# Patient Record
Sex: Female | Born: 1965 | Race: White | Hispanic: No | Marital: Single | Smoking: Current every day smoker
Health system: Southern US, Community
[De-identification: ages and names within clinical notes are randomized; demographics above are authoritative.]

## PROBLEM LIST (undated history)

## (undated) DIAGNOSIS — F419 Anxiety disorder, unspecified: Secondary | ICD-10-CM

## (undated) DIAGNOSIS — I471 Supraventricular tachycardia: Secondary | ICD-10-CM

## (undated) DIAGNOSIS — J449 Chronic obstructive pulmonary disease, unspecified: Secondary | ICD-10-CM

## (undated) DIAGNOSIS — M940 Chondrocostal junction syndrome [Tietze]: Secondary | ICD-10-CM

## (undated) DIAGNOSIS — K5792 Diverticulitis of intestine, part unspecified, without perforation or abscess without bleeding: Secondary | ICD-10-CM

## (undated) DIAGNOSIS — Z720 Tobacco use: Secondary | ICD-10-CM

## (undated) DIAGNOSIS — K2270 Barrett's esophagus without dysplasia: Secondary | ICD-10-CM

## (undated) DIAGNOSIS — G8929 Other chronic pain: Secondary | ICD-10-CM

## (undated) DIAGNOSIS — I8390 Asymptomatic varicose veins of unspecified lower extremity: Secondary | ICD-10-CM

## (undated) DIAGNOSIS — R519 Headache, unspecified: Secondary | ICD-10-CM

## (undated) DIAGNOSIS — M5430 Sciatica, unspecified side: Secondary | ICD-10-CM

## (undated) DIAGNOSIS — C539 Malignant neoplasm of cervix uteri, unspecified: Secondary | ICD-10-CM

## (undated) DIAGNOSIS — K635 Polyp of colon: Secondary | ICD-10-CM

## (undated) DIAGNOSIS — R002 Palpitations: Secondary | ICD-10-CM

## (undated) DIAGNOSIS — K219 Gastro-esophageal reflux disease without esophagitis: Secondary | ICD-10-CM

## (undated) DIAGNOSIS — K5790 Diverticulosis of intestine, part unspecified, without perforation or abscess without bleeding: Secondary | ICD-10-CM

## (undated) DIAGNOSIS — R0600 Dyspnea, unspecified: Secondary | ICD-10-CM

## (undated) DIAGNOSIS — R51 Headache: Secondary | ICD-10-CM

## (undated) DIAGNOSIS — N83209 Unspecified ovarian cyst, unspecified side: Secondary | ICD-10-CM

## (undated) HISTORY — PX: LEEP: SHX91

## (undated) HISTORY — DX: Tobacco use: Z72.0

## (undated) HISTORY — PX: UMBILICAL HERNIA REPAIR: SHX196

## (undated) HISTORY — PX: UPPER GASTROINTESTINAL ENDOSCOPY: SHX188

## (undated) HISTORY — DX: Polyp of colon: K63.5

## (undated) HISTORY — PX: TUBAL LIGATION: SHX77

## (undated) HISTORY — DX: Chronic obstructive pulmonary disease, unspecified: J44.9

## (undated) HISTORY — DX: Barrett's esophagus without dysplasia: K22.70

## (undated) HISTORY — PX: VARICOSE VEIN SURGERY: SHX832

## (undated) HISTORY — DX: Asymptomatic varicose veins of unspecified lower extremity: I83.90

## (undated) HISTORY — DX: Gastro-esophageal reflux disease without esophagitis: K21.9

## (undated) HISTORY — DX: Dyspnea, unspecified: R06.00

## (undated) HISTORY — DX: Headache: R51

## (undated) HISTORY — DX: Sciatica, unspecified side: M54.30

## (undated) HISTORY — DX: Headache, unspecified: R51.9

## (undated) HISTORY — DX: Diverticulosis of intestine, part unspecified, without perforation or abscess without bleeding: K57.90

## (undated) HISTORY — PX: COLONOSCOPY: SHX174

## (undated) HISTORY — DX: Chondrocostal junction syndrome (tietze): M94.0

## (undated) HISTORY — DX: Other chronic pain: G89.29

## (undated) HISTORY — DX: Palpitations: R00.2

## (undated) HISTORY — DX: Diverticulitis of intestine, part unspecified, without perforation or abscess without bleeding: K57.92

## (undated) HISTORY — DX: Malignant neoplasm of cervix uteri, unspecified: C53.9

## (undated) HISTORY — DX: Supraventricular tachycardia: I47.1

---

## 1998-02-01 ENCOUNTER — Inpatient Hospital Stay (HOSPITAL_COMMUNITY): Admission: EM | Admit: 1998-02-01 | Discharge: 1998-02-01 | Payer: Self-pay | Admitting: *Deleted

## 1998-06-19 ENCOUNTER — Emergency Department (HOSPITAL_COMMUNITY): Admission: EM | Admit: 1998-06-19 | Discharge: 1998-06-19 | Payer: Self-pay | Admitting: Emergency Medicine

## 1998-06-27 ENCOUNTER — Emergency Department (HOSPITAL_COMMUNITY): Admission: EM | Admit: 1998-06-27 | Discharge: 1998-06-28 | Payer: Self-pay | Admitting: Emergency Medicine

## 1998-09-07 ENCOUNTER — Encounter: Payer: Self-pay | Admitting: Family Medicine

## 1998-09-07 ENCOUNTER — Emergency Department (HOSPITAL_COMMUNITY): Admission: EM | Admit: 1998-09-07 | Discharge: 1998-09-07 | Payer: Self-pay | Admitting: Family Medicine

## 1998-10-03 ENCOUNTER — Emergency Department (HOSPITAL_COMMUNITY): Admission: EM | Admit: 1998-10-03 | Discharge: 1998-10-04 | Payer: Self-pay | Admitting: Emergency Medicine

## 1998-10-04 ENCOUNTER — Encounter: Payer: Self-pay | Admitting: Emergency Medicine

## 1998-10-07 ENCOUNTER — Emergency Department (HOSPITAL_COMMUNITY): Admission: EM | Admit: 1998-10-07 | Discharge: 1998-10-07 | Payer: Self-pay | Admitting: Emergency Medicine

## 1999-06-13 ENCOUNTER — Emergency Department (HOSPITAL_COMMUNITY): Admission: EM | Admit: 1999-06-13 | Discharge: 1999-06-13 | Payer: Self-pay | Admitting: Emergency Medicine

## 2000-02-16 ENCOUNTER — Ambulatory Visit (HOSPITAL_COMMUNITY): Admission: RE | Admit: 2000-02-16 | Discharge: 2000-02-16 | Payer: Self-pay | Admitting: Internal Medicine

## 2000-02-16 ENCOUNTER — Encounter: Payer: Self-pay | Admitting: Internal Medicine

## 2000-02-28 ENCOUNTER — Encounter: Payer: Self-pay | Admitting: Gastroenterology

## 2000-02-28 ENCOUNTER — Ambulatory Visit (HOSPITAL_COMMUNITY): Admission: RE | Admit: 2000-02-28 | Discharge: 2000-02-28 | Payer: Self-pay | Admitting: Gastroenterology

## 2000-02-29 ENCOUNTER — Ambulatory Visit (HOSPITAL_COMMUNITY): Admission: RE | Admit: 2000-02-29 | Discharge: 2000-02-29 | Payer: Self-pay | Admitting: Gastroenterology

## 2000-03-22 ENCOUNTER — Emergency Department (HOSPITAL_COMMUNITY): Admission: EM | Admit: 2000-03-22 | Discharge: 2000-03-22 | Payer: Self-pay | Admitting: Emergency Medicine

## 2000-03-22 ENCOUNTER — Encounter: Payer: Self-pay | Admitting: Emergency Medicine

## 2000-05-30 ENCOUNTER — Encounter: Payer: Self-pay | Admitting: Gastroenterology

## 2000-05-30 ENCOUNTER — Encounter (INDEPENDENT_AMBULATORY_CARE_PROVIDER_SITE_OTHER): Payer: Self-pay | Admitting: Specialist

## 2000-05-30 ENCOUNTER — Ambulatory Visit (HOSPITAL_COMMUNITY): Admission: RE | Admit: 2000-05-30 | Discharge: 2000-05-30 | Payer: Self-pay | Admitting: Gastroenterology

## 2001-02-08 ENCOUNTER — Inpatient Hospital Stay (HOSPITAL_COMMUNITY): Admission: AD | Admit: 2001-02-08 | Discharge: 2001-02-08 | Payer: Self-pay | Admitting: *Deleted

## 2001-03-24 ENCOUNTER — Emergency Department (HOSPITAL_COMMUNITY): Admission: EM | Admit: 2001-03-24 | Discharge: 2001-03-24 | Payer: Self-pay | Admitting: *Deleted

## 2001-05-01 ENCOUNTER — Encounter: Payer: Self-pay | Admitting: Emergency Medicine

## 2001-05-01 ENCOUNTER — Emergency Department (HOSPITAL_COMMUNITY): Admission: EM | Admit: 2001-05-01 | Discharge: 2001-05-01 | Payer: Self-pay | Admitting: Emergency Medicine

## 2002-03-22 ENCOUNTER — Encounter: Payer: Self-pay | Admitting: Emergency Medicine

## 2002-03-22 ENCOUNTER — Emergency Department (HOSPITAL_COMMUNITY): Admission: EM | Admit: 2002-03-22 | Discharge: 2002-03-22 | Payer: Self-pay | Admitting: Emergency Medicine

## 2002-06-05 ENCOUNTER — Encounter: Payer: Self-pay | Admitting: Emergency Medicine

## 2002-06-05 ENCOUNTER — Emergency Department (HOSPITAL_COMMUNITY): Admission: EM | Admit: 2002-06-05 | Discharge: 2002-06-05 | Payer: Self-pay | Admitting: Emergency Medicine

## 2002-10-10 ENCOUNTER — Ambulatory Visit (HOSPITAL_COMMUNITY): Admission: RE | Admit: 2002-10-10 | Discharge: 2002-10-10 | Payer: Self-pay | Admitting: *Deleted

## 2002-12-01 ENCOUNTER — Ambulatory Visit (HOSPITAL_COMMUNITY): Admission: RE | Admit: 2002-12-01 | Discharge: 2002-12-01 | Payer: Self-pay | Admitting: *Deleted

## 2003-02-04 ENCOUNTER — Inpatient Hospital Stay (HOSPITAL_COMMUNITY): Admission: AD | Admit: 2003-02-04 | Discharge: 2003-02-04 | Payer: Self-pay | Admitting: *Deleted

## 2003-02-06 ENCOUNTER — Inpatient Hospital Stay (HOSPITAL_COMMUNITY): Admission: AD | Admit: 2003-02-06 | Discharge: 2003-02-06 | Payer: Self-pay | Admitting: Family Medicine

## 2003-02-06 ENCOUNTER — Encounter: Payer: Self-pay | Admitting: Family Medicine

## 2003-03-29 ENCOUNTER — Encounter: Payer: Self-pay | Admitting: Family Medicine

## 2003-03-29 ENCOUNTER — Inpatient Hospital Stay (HOSPITAL_COMMUNITY): Admission: AD | Admit: 2003-03-29 | Discharge: 2003-03-29 | Payer: Self-pay | Admitting: Family Medicine

## 2003-04-05 ENCOUNTER — Inpatient Hospital Stay (HOSPITAL_COMMUNITY): Admission: AD | Admit: 2003-04-05 | Discharge: 2003-04-05 | Payer: Self-pay | Admitting: Family Medicine

## 2003-04-15 ENCOUNTER — Inpatient Hospital Stay (HOSPITAL_COMMUNITY): Admission: AD | Admit: 2003-04-15 | Discharge: 2003-04-15 | Payer: Self-pay | Admitting: Obstetrics and Gynecology

## 2003-04-17 ENCOUNTER — Encounter: Payer: Self-pay | Admitting: *Deleted

## 2003-04-17 ENCOUNTER — Inpatient Hospital Stay (HOSPITAL_COMMUNITY): Admission: AD | Admit: 2003-04-17 | Discharge: 2003-04-17 | Payer: Self-pay | Admitting: *Deleted

## 2003-04-18 ENCOUNTER — Inpatient Hospital Stay (HOSPITAL_COMMUNITY): Admission: AD | Admit: 2003-04-18 | Discharge: 2003-04-21 | Payer: Self-pay | Admitting: Obstetrics and Gynecology

## 2003-04-19 ENCOUNTER — Encounter (INDEPENDENT_AMBULATORY_CARE_PROVIDER_SITE_OTHER): Payer: Self-pay | Admitting: Specialist

## 2003-07-17 ENCOUNTER — Encounter (INDEPENDENT_AMBULATORY_CARE_PROVIDER_SITE_OTHER): Payer: Self-pay

## 2003-07-17 ENCOUNTER — Other Ambulatory Visit: Admission: RE | Admit: 2003-07-17 | Discharge: 2003-07-17 | Payer: Self-pay | Admitting: Family Medicine

## 2003-07-17 ENCOUNTER — Encounter: Admission: RE | Admit: 2003-07-17 | Discharge: 2003-07-17 | Payer: Self-pay | Admitting: Family Medicine

## 2003-08-07 ENCOUNTER — Encounter: Admission: RE | Admit: 2003-08-07 | Discharge: 2003-08-07 | Payer: Self-pay | Admitting: *Deleted

## 2003-12-06 ENCOUNTER — Emergency Department (HOSPITAL_COMMUNITY): Admission: AD | Admit: 2003-12-06 | Discharge: 2003-12-06 | Payer: Self-pay | Admitting: Family Medicine

## 2003-12-10 ENCOUNTER — Emergency Department (HOSPITAL_COMMUNITY): Admission: AD | Admit: 2003-12-10 | Discharge: 2003-12-10 | Payer: Self-pay | Admitting: Family Medicine

## 2004-02-20 ENCOUNTER — Emergency Department (HOSPITAL_COMMUNITY): Admission: AD | Admit: 2004-02-20 | Discharge: 2004-02-20 | Payer: Self-pay | Admitting: Family Medicine

## 2004-03-04 ENCOUNTER — Emergency Department (HOSPITAL_COMMUNITY): Admission: EM | Admit: 2004-03-04 | Discharge: 2004-03-04 | Payer: Self-pay | Admitting: Family Medicine

## 2004-07-25 ENCOUNTER — Emergency Department (HOSPITAL_COMMUNITY): Admission: EM | Admit: 2004-07-25 | Discharge: 2004-07-25 | Payer: Self-pay | Admitting: Emergency Medicine

## 2005-05-02 ENCOUNTER — Encounter (INDEPENDENT_AMBULATORY_CARE_PROVIDER_SITE_OTHER): Payer: Self-pay | Admitting: *Deleted

## 2005-05-02 ENCOUNTER — Ambulatory Visit (HOSPITAL_COMMUNITY): Admission: RE | Admit: 2005-05-02 | Discharge: 2005-05-02 | Payer: Self-pay | Admitting: General Surgery

## 2005-09-02 ENCOUNTER — Emergency Department (HOSPITAL_COMMUNITY): Admission: EM | Admit: 2005-09-02 | Discharge: 2005-09-02 | Payer: Self-pay | Admitting: Family Medicine

## 2006-03-24 ENCOUNTER — Encounter: Admission: RE | Admit: 2006-03-24 | Discharge: 2006-03-24 | Payer: Self-pay | Admitting: Neurology

## 2006-07-31 ENCOUNTER — Emergency Department (HOSPITAL_COMMUNITY): Admission: EM | Admit: 2006-07-31 | Discharge: 2006-07-31 | Payer: Self-pay | Admitting: Family Medicine

## 2007-05-18 ENCOUNTER — Inpatient Hospital Stay (HOSPITAL_COMMUNITY): Admission: AD | Admit: 2007-05-18 | Discharge: 2007-05-18 | Payer: Self-pay | Admitting: Obstetrics & Gynecology

## 2007-10-19 ENCOUNTER — Inpatient Hospital Stay (HOSPITAL_COMMUNITY): Admission: EM | Admit: 2007-10-19 | Discharge: 2007-10-22 | Payer: Self-pay | Admitting: Emergency Medicine

## 2008-01-06 ENCOUNTER — Emergency Department (HOSPITAL_COMMUNITY): Admission: EM | Admit: 2008-01-06 | Discharge: 2008-01-06 | Payer: Self-pay | Admitting: Family Medicine

## 2008-01-26 ENCOUNTER — Encounter: Admission: RE | Admit: 2008-01-26 | Discharge: 2008-01-26 | Payer: Self-pay | Admitting: Orthopaedic Surgery

## 2010-04-05 ENCOUNTER — Ambulatory Visit: Payer: Self-pay | Admitting: Cardiovascular Disease

## 2010-04-05 ENCOUNTER — Observation Stay (HOSPITAL_COMMUNITY): Admission: EM | Admit: 2010-04-05 | Discharge: 2010-04-06 | Payer: Self-pay | Admitting: Emergency Medicine

## 2010-04-11 ENCOUNTER — Emergency Department (HOSPITAL_COMMUNITY): Admission: EM | Admit: 2010-04-11 | Discharge: 2010-04-12 | Payer: Self-pay | Admitting: Emergency Medicine

## 2010-04-12 ENCOUNTER — Emergency Department (HOSPITAL_COMMUNITY): Admission: EM | Admit: 2010-04-12 | Discharge: 2010-04-12 | Payer: Self-pay | Admitting: Emergency Medicine

## 2010-10-20 ENCOUNTER — Emergency Department (HOSPITAL_COMMUNITY): Admission: EM | Admit: 2010-10-20 | Discharge: 2010-10-20 | Payer: Self-pay | Admitting: Emergency Medicine

## 2010-11-03 ENCOUNTER — Encounter: Admission: RE | Admit: 2010-11-03 | Discharge: 2010-11-03 | Payer: Self-pay | Admitting: Gastroenterology

## 2011-02-14 LAB — POCT I-STAT, CHEM 8
Calcium, Ion: 1.09 mmol/L — ABNORMAL LOW (ref 1.12–1.32)
Creatinine, Ser: 0.7 mg/dL (ref 0.4–1.2)
Glucose, Bld: 86 mg/dL (ref 70–99)
HCT: 42 % (ref 36.0–46.0)
Hemoglobin: 14.3 g/dL (ref 12.0–15.0)
Potassium: 3.6 mEq/L (ref 3.5–5.1)
TCO2: 23 mmol/L (ref 0–100)

## 2011-02-14 LAB — POCT CARDIAC MARKERS: Myoglobin, poc: 35.8 ng/mL (ref 12–200)

## 2011-02-14 LAB — URINALYSIS, ROUTINE W REFLEX MICROSCOPIC
Bilirubin Urine: NEGATIVE
Glucose, UA: NEGATIVE mg/dL
Ketones, ur: NEGATIVE mg/dL
Specific Gravity, Urine: 1.007 (ref 1.005–1.030)

## 2011-02-14 LAB — CBC
HCT: 40.3 % (ref 36.0–46.0)
MCV: 92.6 fL (ref 78.0–100.0)
Platelets: 193 10*3/uL (ref 150–400)

## 2011-02-14 LAB — DIFFERENTIAL
Basophils Relative: 1 % (ref 0–1)
Eosinophils Absolute: 0.5 10*3/uL (ref 0.0–0.7)
Neutro Abs: 4.1 10*3/uL (ref 1.7–7.7)
Neutrophils Relative %: 62 % (ref 43–77)

## 2011-02-21 LAB — POCT I-STAT, CHEM 8
BUN: 7 mg/dL (ref 6–23)
BUN: 9 mg/dL (ref 6–23)
Calcium, Ion: 1.08 mmol/L — ABNORMAL LOW (ref 1.12–1.32)
Chloride: 109 mEq/L (ref 96–112)
Chloride: 113 mEq/L — ABNORMAL HIGH (ref 96–112)
Creatinine, Ser: 0.4 mg/dL (ref 0.4–1.2)
Creatinine, Ser: 0.7 mg/dL (ref 0.4–1.2)
Glucose, Bld: 138 mg/dL — ABNORMAL HIGH (ref 70–99)
Potassium: 3.7 mEq/L (ref 3.5–5.1)
Sodium: 141 mEq/L (ref 135–145)
Sodium: 142 mEq/L (ref 135–145)

## 2011-02-21 LAB — POCT CARDIAC MARKERS
CKMB, poc: <1 ng/mL — ABNORMAL LOW (ref 1.0–8.0)
CKMB, poc: <1 ng/mL — ABNORMAL LOW (ref 1.0–8.0)
Myoglobin, poc: 27 ng/mL (ref 12–200)
Myoglobin, poc: 37.1 ng/mL (ref 12–200)
Myoglobin, poc: 43.7 ng/mL (ref 12–200)
Myoglobin, poc: 50.4 ng/mL (ref 12–200)
Myoglobin, poc: 51.5 ng/mL (ref 12–200)
Troponin i, poc: <0.05 ng/mL (ref 0.00–0.09)
Troponin i, poc: <0.05 ng/mL (ref 0.00–0.09)

## 2011-02-21 LAB — CBC
Hemoglobin: 15 g/dL (ref 12.0–15.0)
MCHC: 34.6 g/dL (ref 30.0–36.0)
MCV: 92.8 fL (ref 78.0–100.0)
RBC: 4.66 MIL/uL (ref 3.87–5.11)
WBC: 6.9 10*3/uL (ref 4.0–10.5)

## 2011-02-21 LAB — DIFFERENTIAL
Basophils Relative: 1 % (ref 0–1)
Eosinophils Absolute: 0.4 10*3/uL (ref 0.0–0.7)
Lymphs Abs: 1.6 10*3/uL (ref 0.7–4.0)
Monocytes Absolute: 0.4 10*3/uL (ref 0.1–1.0)
Monocytes Relative: 5 % (ref 3–12)
Neutrophils Relative %: 64 % (ref 43–77)

## 2011-02-21 LAB — PROTIME-INR: INR: 0.94 (ref 0.00–1.49)
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
## 2011-04-21 NOTE — Op Note (Signed)
   NAME:  Christine Shah, Christine Shah                           ACCOUNT NO.:  [id number]   MEDICAL RECORD NO.:  [id number]                   PATIENT TYPE:  INP   LOCATION:  9139                                 [facility]   PHYSICIAN:  Phil D. Okey Dupre, M.D.                  DATE OF BIRTH:  June 11, 1966   DATE OF PROCEDURE:  DATE OF DISCHARGE:                                 OPERATIVE REPORT   PREOPERATIVE DIAGNOSIS:  Voluntary sterilization.   POSTOPERATIVE DIAGNOSIS:  Voluntary sterilization.   PROCEDURE:  Bilateral tubal ligation, modified [place].   DESCRIPTION OF PROCEDURE:  After satisfactory epidural anesthesia with the  patient in the dorsal supine position, the abdomen was prepped and draped in  the usual sterile manner and entered through a semilunar incision just below  the umbilicus for 1 cm and extended to a total length of 4 cm.  The abdomen  was entered by layers.  On entering the peritoneal cavity the right  fallopian tube was grasped in the midportion, followed down to the fimbriae.  Just before the fimbriae, the area grasped with emergence of a large venous  blood vessel.  An opening was made in an avascular portion of the  mesosalpinx beneath the tube with a hemostat and one fine suture drawn  through that, tied around the distal and proximal ends of the tube, and  occluded a portion of the blood vessel so that there is a section of tube  above the tie of approximately 2 cm, and a second tie was placed just below  the aforementioned, and the section above the ties was excised and sent for  pathologic diagnosis.  The ends of the tube that remained were coagulated  with hot cautery, as was the end of the vein.  On the left side the  fallopian tube was grasped in the midpoint and the same procedure was  carried out with the exception that there was no blood vessel involved in  the dissection, and the areas were observed for bleeding, none was noted,  and the fascia was closed with a  continuous running 0 Vicryl on an  atraumatic needle, which was continued for subcutaneous closure and then  into subcuticular closure.  A dry sterile dressing was applied.  The patient  tolerated the procedure well with a blood loss of less than 10 mL, was  transferred to the recovery room in satisfactory condition.                                               Phil D. Okey Dupre, M.D.    PDR/MEDQ  D:  04/19/2003  T:  04/20/2003  Job:  [id number]

## 2011-04-21 NOTE — Discharge Summary (Signed)
NAME:  Christine Shah, Christine Shah                           ACCOUNT NO.:  [id number]   MEDICAL RECORD NO.:  [id number]                   PATIENT TYPE:  INP   LOCATION:  9139                                 [facility]   PHYSICIAN:  Phil D. Okey Dupre, M.D.                  DATE OF BIRTH:  Aug 14, 1966   DATE OF ADMISSION:  04/18/2003  DATE OF DISCHARGE:  04/21/2003                                 DISCHARGE SUMMARY   DISCHARGE DIAGNOSES:  1. Spontaneous vaginal delivery of a term female at 62 and two-sevenths weeks.  2. Postpartum bilateral tubal ligation.  3. Ventral hernia.   DISCHARGE MEDICATIONS:  1. Ibuprofen 600 mg q.6h. p.r.n.  2. Prenatal vitamins one tablet p.o. daily while breastfeeding or at least     six weeks.   DISPOSITION:  The patient was discharged home in stable condition with  routine instructions.   FOLLOW-UP:  The patient to follow up at the GYN clinic in one week to be  seen by Dr. Okey Dupre for reevaluation of ventral hernia.   PROCEDURES:  Postpartum bilateral tubal ligation performed by Dr. Okey Dupre on  Apr 19, 2003; please see dictated operative note for details.   HISTORY AND PHYSICAL:  The patient is a 45 year old G2 P1-0-0-1 who  presented at 7 and two-sevenths weeks dated by LMP complaining of  increasing pelvic pain and back pain x1 day.  Uterine contractions were  every three to five minutes for one day prior to presentation.  No loss of  fluid, mild vaginal spotting x48 hours, and good fetal activity.  The  patient had been receiving care at [facility].  The pregnancy  complicated by her history of herpes simplex virus and asthma.  Pertinent  findings on physical exam included temperature of 98.0, blood pressure of  104/56.  Speculum exam showed no pooling, negative Nitrazine, negative  ferning.  Digital cervical exam showed 3 cm dilation, 50% effacement, and -2  station.  After ambulation cervix had progressed to 3-4 cm, 80% effaced, and  -1 station.  Fetal heart  tracing showed a baseline rate of 140 beats per  minute, reactivity with 10 x 10 accelerations, mild variability, and a rare  variable deceleration.  Uterine contractions on toco went from uterine  irritability to steady contractions every five minutes after ambulation.   HOSPITAL COURSE:  #1 - INTRAUTERINE PREGNANCY AT 29 AND TWO-SEVENTHS WEEKS.  The patient went on to a normal spontaneous vaginal delivery under epidural  anesthesia, no lacerations.  The patient had an uncomplicated postpartum  course.  Postpartum hemoglobin 11.4, hematocrit 33.1 on Apr 20, 2003.  The  patient was GBS negative.   #2 - FAMILY PLANNING.  The patient underwent a postpartum tubal ligation  performed by Dr. Okey Dupre.  At the time of discharge the patient's incision was  clean, dry, and intact with no surrounding erythema.  Postoperative course  was uncomplicated.   #3 - VENTRAL HERNIA.  After BTL the patient noted a small prominence lateral  to the BTL incision.  This area of prominence increased when the patient was  upright; however, did not increase with Valsalva.  It was soft, was not  reducible.  No erythema, was not tense, but tender to palpation.  After  several serial examinations it was determined this was likely a small  ventral hernia and this would be followed by Dr. Okey Dupre at the GYN clinic in  one week.   #4 - FEMALE INFANT.  Status post circumcision, Apgars of 9 at one minute and 9  at five minutes, and is being breast fed.     Barney Drain, M.D.                       Phil D. Okey Dupre, M.D.    SS/MEDQ  D:  04/21/2003  T:  04/21/2003  Job:  [id number]

## 2011-04-21 NOTE — Op Note (Signed)
NAMEBRAXTYN, Christine Shah                 ACCOUNT NO.:  [id number]   MEDICAL RECORD NO.:  [id number]          PATIENT TYPE:  OIB   LOCATION:  2899                         FACILITY:  [facility]   PHYSICIAN:  Leonie Man, M.D.   DATE OF BIRTH:  Apr 04, 1966   DATE OF PROCEDURE:  05/02/2005  DATE OF DISCHARGE:                                 OPERATIVE REPORT   PREOPERATIVE DIAGNOSIS:  Abdominal wall hernia and probable spigelian   POSTOPERATIVE DIAGNOSIS:  Ventral incisional hernia.   PROCEDURE:  Repair of ventral incisional hernia with mesh.   SURGEON:  Leonie Man, M.D.   ASSISTANT:  Nurse.   ANESTHESIA:  General.   HISTORY:  The patient is a 45 year old female who presents with a right  lower quadrant mass just inferior and to the right of the umbilicus  extending over to the rectus and oblique fascial separations. The patient  had been having pain from this mass for the past three weeks. It should be  noted that she had a bilateral tubal ligation in the past. She comes to the  operating room now after the risks and potential benefits of surgery have  been discussed. All questions answered and consent obtained.   DESCRIPTION OF PROCEDURE:  Following the induction of satisfactory general  endotracheal anesthesia, the patient was positioned supinely and the abdomen  is prepped and draped routinely. A transverse incision was made over the  region of the mass deep and through the skin and subcutaneous tissue and  carried down to a hernial sac which is noted to have the origin of the  hernia just below and to the right of the umbilicus. The hernia sac was  dissected free from the fascia and the incarcerated omentum within the sac  was then dissected free and reduced back into the peritoneal cavity. Sponge  and instrument counts were verified. I repaired the hernial defect with a  Bard mesh plug sewn in with interrupted sutures of #0 Novofil. At the end of  the repair, hemostasis was noted  to be intact. Sponge and instrument counts  again verified. The wounds were thoroughly irrigated with normal saline and  the subcutaneous tissues closed with interrupted 2-0 Vicryl sutures. The  skin was closed with running 4-0 Monocryl suture and then reinforced with  Steri-Strips. A sterile dressing applied. Anesthetic reversed. The patient  removed from the operating room to the recovery room in stable condition.  She tolerated the procedure well.      PB/MEDQ  D:  05/02/2005  T:  05/02/2005  Job:  [id number]

## 2011-04-21 NOTE — Group Therapy Note (Signed)
NAME:  Christine Shah, Christine Shah                           ACCOUNT NO.:  [id number]   MEDICAL RECORD NO.:  [id number]                   PATIENT TYPE:  OUT   LOCATION:  [facility]                           FACILITY:  [facility]   PHYSICIAN:  Tinnie Gens, MD                     DATE OF BIRTH:  18-Feb-1966   DATE OF SERVICE:  07/17/2003                                    CLINIC NOTE   CHIEF COMPLAINT:  Labial lesion.   HISTORY OF PRESENT ILLNESS:  The patient is a 45 year old gravida 2, para 2  who was referred in today from [facility] for a lesion on her left  labia.  Apparently, she is status post normal spontaneous vaginal delivery  in May of this year and had a BTL on May 17.  She was seen at [facility]  and noted to have a small lesion on the left labia minora that was dark and  round and she was referred for that purpose.  The lesion does not seem to be  bothering her.  She has no vulvar itching or irritation and the lesion has  not changed in size or texture since it first appeared.   PAST MEDICAL HISTORY:  Colitis and asthma.   PAST SURGICAL HISTORY:  BTL.   PAST GYN HISTORY:  Menarche was at age 84.  Cycles are regular, every month,  last six days.  She is status post tubal ligation.  She has history of HSV.  She does have a history of abnormal Paps which were treated with a LEEP in  1995 and 1996.  Additionally, she has a history of having a colonoscopy in  2001.   PAST OBSTETRICAL HISTORY:  She is G2, P2.  Two spontaneous vaginal  deliveries.   FAMILY HISTORY:  Significant for mother with diabetes, coronary artery  disease, high blood pressure, and history of blood clots.  Father has  history of heart disease.   SOCIAL HISTORY:  She lives with her children.  She does smoke one and a half  packs per day.  She denies alcohol use and other drug use.   REVIEW OF SYSTEMS:  Significant for weight loss following delivery, chronic  cough, some shortness of breath, and blood in her  stools consistent with her  past history.  Other review of systems is reviewed and completely normal.   PHYSICAL EXAMINATION:  GENITOURINARY:  Normal external female genitalia.  There is a small, darkened area in the left labia minora at the inferior end  consistent with a dilated blood vessel.  This area is injected with 2%  lidocaine with epinephrine 1:200,000.  A punch biopsy was then taken and the  specimen sent to pathology.  Silver nitrate is used for cauterization.  The  patient tolerated the procedure well.   IMPRESSION:  1. Left labial lesion, likely a dilated blood vessel related to her vaginal  delivery.  2. Status post bilateral tubal ligation.  3. Tobacco abuse.  4. History of abnormal Pap.   PLAN:  The patient will return two weeks to review pathology of this lesion.  Otherwise, she will follow up with her primary care giver.                                               Tinnie Gens, MD    TP/MEDQ  D:  07/17/2003  T:  07/17/2003  Job:  [id number]

## 2011-04-21 NOTE — Op Note (Signed)
[facility]  Patient:    Christine Shah, Christine Shah                        MRN: [id number] Proc. Date: 05/30/00 Adm. Date:  [id number] Disc. Date: [id number] Attending:  Deneen Harts CC:         Dr. French Ana, [facility]                           Operative Report  PROCEDURE:  Colonoscopic polypectomy, biopsy.  INDICATION FOR PROCEDURE:  A 45 year old white female with onset of diarrhea approximately 6 weeks ago. She had a membranous colitis and was treated with a 10 day course of Flagyl with resolution. Because of recurrent symptoms, C difficile testing was performed following the treatment and again rechecked along with a more extensive stool evaluation May 14, 2000, all of which was negative including culture, O/P, Hemoccult, fecal leukocyte. Because of persistent diarrhea up to 10-12 watery stools daily, colonoscopy is being performed.  DESCRIPTION OF PROCEDURE:  After reviewing the nature of the procedure with the patient including potential risks and complications, and after discussing alternative methods of diagnosis and treatment informed consent was signed.  The patient was premedicated receiving IV sedation totaling Versed 8.5 mg and fentanyl 100 mcg administered IV in divided doses prior to and during the course of the procedure.  Using an Olympus pediatric PCF-140L video colonoscope, the rectum was intubated after a normal digital examination revealing no evidence of perianal or intrarectal pathology. The scope was inserted and advanced around the entire length of the colon to the cecum identified by the appendiceal orifice, ileocecal valve and intubation of the terminal ileum. Photo documentation obtained. Preparation was excellent throughout.  Biopsies were obtained from the terminal ileum and cecum, both of which appeared entirely normal. The remainder of the colon was examined in a retrograde manner with careful inspection  throughout.  In the distal descending colon, there was mild patchy erythema consistent with resolving acute colitis. No pseudomembranes, ulceration or mucopurulent exudate. Biopsies were obtained and submitted in bottle #3.  The final finding was that of distal sigmoid and sessile rectal polyps. The former 8 mm latter 10 mm diameter. The smaller resected with hot biopsy forceps, larger rectal, polyp resected with electrocautery snare, recovered and submitted to pathology. Retroflexed view in the rectal vault revealed no additional lesion.  The colon was decompressed, scope withdrawn.  The patient tolerated the procedure without difficulty being maintained on Datascope monitor and low flow oxygen throughout. Returned to recovery in stable condition.  Time 2, technical 3, preparation 1, total score equal 6.  ASSESSMENT: 1. Colitis--distal left colon, resolving acute colitis. No evidence of    pseudomembranous colitis. Biopsies obtained, pending. 2. Polyps--sigmoid and rectum. Adenomatous appearing. Resected, pathology    pending. 3. Diarrhea--uncertain etiology. Random biopsies obtained from the terminal    ileum and cecum to rule out microscopic colitis.  RECOMMENDATION: 1. Flagyl 250 mg 1 tablet p.o. q.i.d. for 1 week, empiric therapy given    ongoing inflammation noted at colonoscopy. 2. Follow-up pathology. 3. Repeat colonoscopy in 3 years if adenomatous colon polyp. 4. Return office visit 2-3 weeks to review findings and response to Flagyl    therapy. DD:  05/30/00 TD:  06/01/00 Job: [id number]

## 2011-08-09 ENCOUNTER — Emergency Department (HOSPITAL_COMMUNITY)
Admission: EM | Admit: 2011-08-09 | Discharge: 2011-08-10 | Disposition: A | Discharge status: Home / Self Care | Payer: 59 | Attending: Emergency Medicine | Admitting: Emergency Medicine

## 2011-08-09 DIAGNOSIS — R209 Unspecified disturbances of skin sensation: Secondary | ICD-10-CM | POA: Insufficient documentation

## 2011-08-09 DIAGNOSIS — R262 Difficulty in walking, not elsewhere classified: Secondary | ICD-10-CM | POA: Insufficient documentation

## 2011-08-09 DIAGNOSIS — R42 Dizziness and giddiness: Secondary | ICD-10-CM | POA: Insufficient documentation

## 2011-08-09 DIAGNOSIS — Z8541 Personal history of malignant neoplasm of cervix uteri: Secondary | ICD-10-CM | POA: Insufficient documentation

## 2011-08-10 ENCOUNTER — Emergency Department (HOSPITAL_COMMUNITY): Payer: 59

## 2011-09-12 LAB — URINALYSIS, ROUTINE W REFLEX MICROSCOPIC
Glucose, UA: NEGATIVE
Hgb urine dipstick: NEGATIVE
Ketones, ur: NEGATIVE
Protein, ur: 30 — AB
pH: 6.5

## 2011-09-12 LAB — URINE MICROSCOPIC-ADD ON

## 2011-09-15 ENCOUNTER — Emergency Department (HOSPITAL_COMMUNITY): Payer: 59

## 2011-09-15 ENCOUNTER — Observation Stay (HOSPITAL_COMMUNITY)
Admission: EM | Admit: 2011-09-15 | Discharge: 2011-09-17 | Disposition: A | Discharge status: Home / Self Care | Payer: 59 | Attending: Internal Medicine | Admitting: Internal Medicine

## 2011-09-15 DIAGNOSIS — K219 Gastro-esophageal reflux disease without esophagitis: Secondary | ICD-10-CM | POA: Insufficient documentation

## 2011-09-15 DIAGNOSIS — R110 Nausea: Secondary | ICD-10-CM | POA: Insufficient documentation

## 2011-09-15 DIAGNOSIS — K7689 Other specified diseases of liver: Secondary | ICD-10-CM | POA: Insufficient documentation

## 2011-09-15 DIAGNOSIS — Z8541 Personal history of malignant neoplasm of cervix uteri: Secondary | ICD-10-CM | POA: Insufficient documentation

## 2011-09-15 DIAGNOSIS — R0789 Other chest pain: Principal | ICD-10-CM | POA: Insufficient documentation

## 2011-09-15 DIAGNOSIS — K838 Other specified diseases of biliary tract: Secondary | ICD-10-CM | POA: Insufficient documentation

## 2011-09-15 DIAGNOSIS — R109 Unspecified abdominal pain: Secondary | ICD-10-CM | POA: Insufficient documentation

## 2011-09-15 DIAGNOSIS — F17200 Nicotine dependence, unspecified, uncomplicated: Secondary | ICD-10-CM | POA: Insufficient documentation

## 2011-09-15 DIAGNOSIS — M549 Dorsalgia, unspecified: Secondary | ICD-10-CM | POA: Insufficient documentation

## 2011-09-15 DIAGNOSIS — Z79899 Other long term (current) drug therapy: Secondary | ICD-10-CM | POA: Insufficient documentation

## 2011-09-15 DIAGNOSIS — Z7982 Long term (current) use of aspirin: Secondary | ICD-10-CM | POA: Insufficient documentation

## 2011-09-15 DIAGNOSIS — K2270 Barrett's esophagus without dysplasia: Secondary | ICD-10-CM | POA: Insufficient documentation

## 2011-09-15 LAB — DIFFERENTIAL
Eosinophils Absolute: 0.6 10*3/uL (ref 0.0–0.7)
Lymphs Abs: 2.1 10*3/uL (ref 0.7–4.0)
Monocytes Absolute: 0.5 10*3/uL (ref 0.1–1.0)
Monocytes Relative: 6 % (ref 3–12)
Neutrophils Relative %: 59 % (ref 43–77)

## 2011-09-15 LAB — CBC
MCH: 30.3 pg (ref 26.0–34.0)
MCV: 87.3 fL (ref 78.0–100.0)
Platelets: 230 10*3/uL (ref 150–400)
RBC: 4.88 MIL/uL (ref 3.87–5.11)

## 2011-09-15 LAB — URINE MICROSCOPIC-ADD ON

## 2011-09-15 LAB — URINALYSIS, ROUTINE W REFLEX MICROSCOPIC
Leukocytes, UA: NEGATIVE
Nitrite: NEGATIVE
Protein, ur: NEGATIVE mg/dL
Urobilinogen, UA: 2 mg/dL — ABNORMAL HIGH (ref 0.0–1.0)

## 2011-09-15 LAB — COMPREHENSIVE METABOLIC PANEL
AST: 20 U/L (ref 0–37)
Albumin: 3.8 g/dL (ref 3.5–5.2)
Alkaline Phosphatase: 74 U/L (ref 39–117)
Chloride: 104 mEq/L (ref 96–112)
Creatinine, Ser: 0.6 mg/dL (ref 0.50–1.10)
Potassium: 4.5 mEq/L (ref 3.5–5.1)
Total Bilirubin: 0.3 mg/dL (ref 0.3–1.2)
Total Protein: 7.1 g/dL (ref 6.0–8.3)

## 2011-09-15 LAB — TROPONIN I: Troponin I: <0.3 ng/mL (ref <0.30)

## 2011-09-15 LAB — POCT PREGNANCY, URINE: Preg Test, Ur: NEGATIVE

## 2011-09-16 DIAGNOSIS — R079 Chest pain, unspecified: Secondary | ICD-10-CM

## 2011-09-16 LAB — URINE DRUGS OF ABUSE SCREEN W ALC, ROUTINE (REF LAB)
Amphetamine Screen, Ur: NEGATIVE
Barbiturate Quant, Ur: POSITIVE — AB
Benzodiazepines.: POSITIVE — AB
Marijuana Metabolite: NEGATIVE
Methadone: NEGATIVE
Propoxyphene: NEGATIVE

## 2011-09-16 LAB — LIPID PANEL
HDL: 30 mg/dL — ABNORMAL LOW (ref >39)
Total CHOL/HDL Ratio: 3.7 RATIO
Triglycerides: 64 mg/dL (ref <150)

## 2011-09-16 LAB — CARDIAC PANEL(CRET KIN+CKTOT+MB+TROPI)
Relative Index: INVALID (ref 0.0–2.5)
Relative Index: INVALID (ref 0.0–2.5)
Relative Index: INVALID (ref 0.0–2.5)
Total CK: 78 U/L (ref 7–177)
Total CK: 64 U/L (ref 7–177)
Troponin I: <0.3 ng/mL (ref <0.30)
Troponin I: <0.3 ng/mL (ref <0.30)

## 2011-09-16 LAB — HEMOGLOBIN A1C: Hgb A1c MFr Bld: 5.8 % — ABNORMAL HIGH (ref <5.7)

## 2011-09-16 NOTE — H&P (Signed)
Christine Shah, Christine Shah                 ACCOUNT NO.:  [id number]  MEDICAL RECORD NO.:  [id number]  LOCATION:  WLED                         FACILITY:  [facility]  PHYSICIAN:  Hillery Aldo, M.D.   DATE OF BIRTH:  11-23-1966  DATE OF ADMISSION:  09/15/2011 DATE OF DISCHARGE:                             HISTORY & PHYSICAL   PRIMARY CARE PHYSICIAN:  Dr. Lupe Carney.  CHIEF COMPLAINT:  Chest pain.  HISTORY OF PRESENT ILLNESS:  The patient is a 45 year old female with a past medical history of hiatal hernia, gastroesophageal reflux disease, and Barrett esophagus, who presented to the hospital with a 4-5 day history of constant chest pain.  The pain came on when she was at rest and therefore is nonexertional.  It is rated currently as a level 6-7, but has been higher at its worst.  It is pressure like and located in the substernal area and radiates around to the left side, up to her neck and through her back.  She has no history of similar pain.  She has no associated shortness of breath, but has had some intermittent nausea. No diaphoresis.  There are no aggravating or alleviating factors.  Upon initial evaluation in the emergency department, the patient was found to have 2 risk factors for coronary artery disease including her smoking history and family history of early MI and therefore she has been referred to the hospitalist service for further evaluation and treatment.  PAST MEDICAL HISTORY: 1. Cervical cancer status post LEEP surgery. 2. History of tubal ligation. 3. History of ventral hernia repair. 4. Barrett esophagus/hiatal hernia/gastroesophageal reflux disease. 5. History of colon polyps status post polypectomy.  FAMILY HISTORY:  Patient's mother died at age 36 from complications of a seizure disorder.  The patient's father died at 93 from presumed heart disease.  She has 5 siblings.  One brother has had a stroke and is diabetic.  One sister has hypertension and seizure  disorder.  One sister is diabetic, hypertensive, and has a history of stomach and liver cancer.  One brother is diabetic and hypertensive, and 1 sister had an MI in her 12s and is diabetic.  SOCIAL HISTORY:  The patient is single and lives with her 77-year-old son.  She smokes 1-1/2 packs of tobacco daily and has done so for 27 years.  No alcohol or drug use.  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  ALLERGIES:  No known drug allergies.  CURRENT MEDICATIONS: 1. Prilosec 40 mg p.o. daily. 2. Vitamin D 1000 international units p.o. daily.  REVIEW OF SYSTEMS:  CONSTITUTIONAL:  No fever or chills.  Appetite is good.  No weight loss.  HEENT:  No complaints.  CARDIOVASCULAR:  As per the HPI above.  RESPIRATORY:  No shortness breath or cough.  GI: Positive for nausea.  No vomiting, diarrhea, melena, or hematochezia. GU:  No dysuria or hematuria.  Comprehensive 14-point review of systems is otherwise unremarkable.  PHYSICAL EXAMINATION:  VITAL SIGNS:  Temperature 98.7, pulse 63, respirations 16, blood pressure 101/55, O2 saturation 100% on room air. GENERAL:  Obese Caucasian female, in no acute distress. HEENT:  Normocephalic, atraumatic.  PERRL.  EOMI.  Oropharynx  is clear. NECK:  Supple, no thyromegaly, no lymphadenopathy, no jugular venous distention.  CHEST:  Clear to auscultation bilaterally with good air movement. HEART:  Regular rate, rhythm.  No murmurs, rubs, or gallops. ABDOMEN:  Soft, nontender, nondistended with normoactive bowel sounds. EXTREMITIES:  No clubbing, edema, or cyanosis. SKIN:  Warm and dry.  No rashes. NEUROLOGIC:  The patient is alert and oriented x3.  Cranial nerves II through XII are grossly intact.  Nonfocal.  DATA REVIEW:  Abdominal ultrasound showed a dilated common bile duct without a discrete stone or mass.  Probable tiny polyp in the gallbladder.  Hepatic steatosis.  LABORATORY DATA:  Urine pregnancy test is negative.  Urinalysis is negative  for nitrites and leukocytes.  Lipase is 30.  Troponin is less than 0.30.  White blood cell count is 7.8, hemoglobin 14.8, hematocrit 42.6, and platelets 230.  Sodium is 140, potassium 4.5, chloride 104, bicarb 25, BUN 8, creatinine 0.60, glucose 98, calcium 8.8.  Total bilirubin 0.3, alkaline phosphatase 74, AST 20, ALT 16, total protein 7.1, and albumin 3.8.  ASSESSMENT AND PLAN: 1. Atypical chest pain:  We will admit the patient given her history     of heavy tobacco abuse and early family history of heart disease.     We will monitor her on telemetry and rule out acute coronary     syndrome by cycling cardiac enzymes q.6 hours x3 sets.  We will     start her on low-dose aspirin.  We would consider a cardiology     consultation for consideration of inpatient stress testing versus     outpatient stress testing if her markers are negative.  I have     counseled her on the importance of tobacco cessation as well.  The     patient will be provided with PPI therapy and pain medications     p.r.n. 2. Barrett esophagus/hiatal hernia/gastroesophageal reflux disease:     We will increase the patient's PPI therapy to 40 mg p.o. b.i.d.     given that we are starting her on aspirin and she is afraid that     this will aggravate her GI symptoms. 3. Tobacco abuse:  The patient was counseled.  We will provide her     with a nicotine patch if she requests. 4. Prophylaxis:  We will initiate DVT prophylaxis with Lovenox. Time spent on admission including face-to-face time is approximately 1 hour.     Hillery Aldo, M.D.     CR/MEDQ  D:  09/15/2011  T:  09/15/2011  Job:  [id number]  cc:   Dr. Lupe Carney  Electronically Signed by Hillery Aldo M.D. on 09/16/2011 12:31:49 PM

## 2011-09-17 ENCOUNTER — Other Ambulatory Visit (HOSPITAL_COMMUNITY): Payer: 59

## 2011-09-17 ENCOUNTER — Ambulatory Visit (HOSPITAL_COMMUNITY)
Admission: RE | Admit: 2011-09-17 | Discharge: 2011-09-17 | Disposition: A | Discharge status: Home / Self Care | Payer: 59 | Source: Ambulatory Visit | Attending: Cardiology | Admitting: Cardiology

## 2011-09-17 ENCOUNTER — Ambulatory Visit (HOSPITAL_COMMUNITY): Admission: RE | Admit: 2011-09-17 | Payer: 59 | Source: Ambulatory Visit | Admitting: Cardiology

## 2011-09-17 ENCOUNTER — Observation Stay (HOSPITAL_COMMUNITY): Payer: 59

## 2011-09-17 LAB — BASIC METABOLIC PANEL
BUN: 11 mg/dL (ref 6–23)
CO2: 28 mEq/L (ref 19–32)
Chloride: 107 mEq/L (ref 96–112)
GFR calc non Af Amer: >90 mL/min (ref >90)
Glucose, Bld: 97 mg/dL (ref 70–99)
Potassium: 3.8 mEq/L (ref 3.5–5.1)
Sodium: 140 mEq/L (ref 135–145)

## 2011-09-17 MED ORDER — TECHNETIUM TC 99M TETROFOSMIN IV KIT
10.0000 | PACK | Freq: Once | INTRAVENOUS | Status: AC | PRN
  Administered 2011-09-17: 10 via INTRAVENOUS

## 2011-09-17 MED ORDER — TECHNETIUM TC 99M TETROFOSMIN IV KIT
30.0000 | PACK | Freq: Once | INTRAVENOUS | Status: AC | PRN
  Administered 2011-09-17: 30 via INTRAVENOUS

## 2011-09-17 NOTE — Discharge Summary (Signed)
Christine Shah, Christine Shah NO.:  [id number]  MEDICAL RECORD NO.:  [id number]  LOCATION:  1437                         FACILITY:  [facility]  PHYSICIAN:  Talmage Nap, MD  DATE OF BIRTH:  02-Jun-1966  DATE OF ADMISSION:  09/15/2011 DATE OF DISCHARGE:  09/17/2011                        DISCHARGE SUMMARY - REFERRING   PRIMARY CARE PHYSICIAN:  Dr. Lupe Carney.  CONSULTANT INVOLVED IN THE CASE:  Cardiology, Dr. Jonelle Sidle.  DISCHARGE DIAGNOSES: 1. Chest pain - negative cardiac enzymes and Myoview report said to be     unremarkable, low probability for acute coronary syndrome. 2. Gastroesophageal reflux disease. 3. History of Barrett esophagus. 4. Chronic tobacco use.  The patient is a 45 year old Caucasian female with history of GERD and Barrett esophagus, who was admitted to the hospital on September 15, 2011, by Dr. Trula Ore Rama with the complaint of chest pain, which was said to have started at rest and was nonexertional.  Pain was said to have been more on standing and radiating to the left side and also to the back. She denied any associated shortness of breath.  The patient was said to have been nauseated, but denied any vomiting.  There is no diaphoresis, no fever, no chills, no rigor, and subsequently admitted for further evaluation.  PREADMISSION MEDICATIONS:  Include; 1. Prilosec 40 mg p.o. daily. 2. Vitamin D 1000 international units p.o. daily.  ALLERGIES:  She has no known allergies.  SOCIAL HISTORY:  The patient is said to be single, lives with her 8-year- old son.  Smokes about 1 to 1-1/2 packs of cigarettes per day for the past 27 years.  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  FAMILY HISTORY:  Mother said to be deceased at the age of 60 from complication of seizure.  Father died at the age of 40 from cardiovascular disease.  Have 5 siblings.  One had CVA and diabetic, and the other  sister has hypertension and has seizure disorder.  REVIEW OF SYSTEMS:  Essentially documented in the initial history and physical.  PHYSICAL EXAMINATION:  At that time, the patient was seen by the admitting physician. VITAL SIGNS:  Temperature 97.8, pulse 62, respiratory rate 16, blood pressure 101/52, and saturating 100% on room air. HEENT:  Obese and not in any respiratory distress.  Pupils are reactive to light and extraocular muscles are intact. NECK:  No jugular venous distention.  No carotid bruit.  No lymphadenopathy. CHEST:  Clear to auscultation. HEART SOUNDS:  1 and 2. ABDOMEN:  Soft, nontender.  Liver, spleen, and kidney not palpable. Bowel sounds are positive. EXTREMITIES:  No pedal edema. NEUROLOGIC:  Exam is nonfocal. MUSCULOSKELETAL SYSTEM:  Unremarkable. SKIN:  Showed normal turgor.  LABORATORY DATA:  Pregnancy test, negative.  Urinalysis, unremarkable. Urine microscopy, unremarkable.  Lipase level is 30, normal.  Complete blood count with differential showed a WBC of 7.8, hemoglobin of 14.8, hematocrit 42.6, MCV of 87.3, and platelet count of 230.  Comprehensive metabolic panel showed a sodium of 140, potassium of 4.5, chloride of 104, bicarbonate is 25, glucose is 98, BUN is 8, and creatinine 0.60. LFT, normal.  First set of cardiac markers and troponin-I less than 0.30, less than 0.30, less than 0.30, and less than 0.30 respectively. Lipid panel showed total cholesterol 110, triglyceride 64, HDL 30, and LDL 67.  Hemoglobin A1c 5.8.  TSH 1.521, normal.  Urine drug screen positive for barbiturates and benzodiazepines.  A repeat basic metabolic panel done on September 17, 2011, showed a sodium of 140, potassium of 3.8, chloride of 107 with a bicarbonate of 28, glucose is 97, BUN is 11, and creatinine 0.65.  IMAGING STUDIES:  Done on the patient include ultrasound of the abdomen, which showed dilated common bile duct without any discrete stone on mass.  There is  probable tiny polyp in the gallbladder and hepatic steatosis.  Myoview showed no evidence of prior infarction or pharmacological-induced ischemia, EF is 72%, essentially normal.  HOSPITAL COURSE:  The patient was admitted to Telemetry with an impression of atypical chest pain.  She was put on Lovenox 40 mg subcutaneously q.24 h. for deep venous thrombosis prophylaxis, given Zofran for nausea and then Mylanta and MiraLAX for constipation.  She was also started on aspirin 81 mg p.o. daily, Ambien 5 mg p.o. q.h.s. for p.r.n.; and then she was given Tylenol, oxycodone, and then morphine 2 mg to 4 mg IV q.4 h. p.r.n. for pain control.  She was also started on nicotine patch 24 mg transdermal p.r.n. since the patient requested for that and GI prophylaxis was done with Protonix 40 mg p.o. b.i.d. and she was restarted on vitamin D 1000 international units p.o. daily.  The patient was evaluated by the cardiologist, Dr. Jonelle Sidle, who recommended the patient to have a Myoview and if report is unremarkable, the patient will subsequently be discharged.  The patient was seen by me today for the very 1st time in this admission post Myoview, complained of mild discomfort around the left side of the chest.  Denies any associated shortness of breath or diaphoresis.  Examination of the patient was essentially unremarkable and her vital signs, blood pressure 90/58, temperature was 98.2, pulse 60, respiratory rate 16, and medically stable.  I had an extensive discussion with the patient and the patient's family members about Myoview report, and the patient's medical condition and all verbalized understanding.  So far, the patient has remained medically stable.  Plan for the patient to be discharged home today on activity as tolerated; low-sodium, low-cholesterol diet; follow up with her primary care physician in 1 to 2 weeks.  MEDICATIONS TO BE TAKEN AT HOME:  Include; 1. Aspirin 81 mg p.o.  daily. 2. Nicotine transdermal patch 21 mg q.24 h. p.r.n. 3. Nitroglycerin 0.4 mg sublingual q.5 minutes x3 p.r.n. for chest     pain. 4. Pantoprazole 40 mg 1 p.o. b.i.d. with meals. 5. Vitamin D over the counter 1 p.o. daily.     Talmage Nap, MD     CN/MEDQ  D:  09/17/2011  T:  09/17/2011  Job:  [id number]  cc:   Lupe Carney, MD Primary Care Physician  Electronically Signed by Talmage Nap  on 09/17/2011 06:58:42 PM

## 2011-09-18 ENCOUNTER — Other Ambulatory Visit (HOSPITAL_COMMUNITY): Payer: Self-pay | Admitting: Gastroenterology

## 2011-09-18 DIAGNOSIS — R110 Nausea: Secondary | ICD-10-CM

## 2011-09-18 DIAGNOSIS — R1011 Right upper quadrant pain: Secondary | ICD-10-CM

## 2011-09-20 LAB — BENZODIAZEPINE, QUANTITATIVE, URINE
Alprazolam (GC/LC/MS), ur confirm: NEGATIVE NG/ML
Flurazepam GC/MS Conf: NEGATIVE NG/ML
Nordiazepam GC/MS Conf: NEGATIVE NG/ML
Oxazepam GC/MS Conf: 440 NG/ML — ABNORMAL HIGH
Temazepam GC/MS Conf: 220 NG/ML — ABNORMAL HIGH

## 2011-09-21 LAB — CBC
HCT: 44.1
MCHC: 33.7
MCV: 92
Platelets: 180
WBC: 8.4

## 2011-09-21 LAB — URINALYSIS, ROUTINE W REFLEX MICROSCOPIC
Bilirubin Urine: NEGATIVE
Ketones, ur: NEGATIVE
Nitrite: NEGATIVE
Protein, ur: NEGATIVE
pH: 5.5

## 2011-09-21 LAB — WET PREP, GENITAL
Clue Cells Wet Prep HPF POC: NONE SEEN
Trich, Wet Prep: NONE SEEN

## 2011-09-23 LAB — BARBITURATE, URINE, CONFIRMATION
Butalbital UR Quant: NEGATIVE
Pentobarbital GC/MS Conf: NEGATIVE
Phenobarbital GC/MS Conf: 330 ng/mL

## 2011-10-01 NOTE — Consult Note (Signed)
[name], DECAPRIO NO.:  [id number]  MEDICAL RECORD NO.:  [id number]  LOCATION:  1437                         FACILITY:  [facility]  PHYSICIAN:  Jonelle Sidle, MD DATE OF BIRTH:  Dec 11, 1965  DATE OF CONSULTATION: DATE OF DISCHARGE:                                CONSULTATION   REQUESTING PHYSICIAN:  Isidor Holts, M.D. with the [facility].  PRIMARY CARE PHYSICIAN:  Dr. Lupe Carney.  REASON FOR CONSULTATION:  Chest pain.  HISTORY OF PRESENT ILLNESS:  Christine Shah is a 45 year old woman with a history of hiatal hernia with gastroesophageal reflux disease and Barrett esophagus, ongoing tobacco abuse, and family history of premature cardiovascular disease.  She reports being in her usual state of health, under intermittent psychosocial stressors, until the last 5 days when she began to experience a "dull and sometimes sharp" mid lower sternal and epigastric discomfort.  Within the last few days, she also describes an associated "tightness" in her chest that has been waxing and waning, not necessarily worse with exertion.  She also reports baseline problems with mild dysphagia and reflux.  Symptoms worsened and she presented to the emergency room for further evaluation.  She has now been admitted to the hospital for evaluation and continues to have intermittent chest pain symptoms, although her electrocardiogram has been nonspecific without any obvious injury current pattern.  Cardiac markers have also been normal with troponin-I levels less than 0.30. She had an abdominal ultrasound obtained showing a dilated common bile duct, however, without any obvious stone or mass, possible tiny polyp in the gallbladder, and hepatic steatosis.  No obvious acute findings were described.  We have been asked to consult to assist with her evaluation.  ALLERGIES:  No known drug allergies.  PRESENT MEDICATIONS: 1. Aspirin 81 mg p.o. daily. 2. Vitamin D3  1000 units p.o. daily. 3. Lovenox 40 mg subcu daily. 4. Protonix 40 mg p.o. b.i.d. and also p.r.n. medications. 5. At home, she was taking Prilosec at 40 mg daily.  PAST MEDICAL HISTORY:  As reviewed above.  Additional history includes: Cervical cancer status post LEEP surgery, tubal ligation, ventral hernia repair, prior endoscopy with Dr. Lottie Mussel approximately 1 year ago, history of colonic polyps and polypectomy.  I also find a previous exercise echocardiogram from May, 2011, that demonstrated no active ischemia by ECG or echocardiographic criteria.  Of note, the patient states that she was having significant anxiety at that time, and that her chest pain symptoms were different.  SOCIAL HISTORY:  The patient is single, has an 35 year old son.  She smokes a pack and a half cigarettes a day, has done so for greater than 20 years.  No reported alcohol or illicit substance use.  She works in a Teaching laboratory technician.  FAMILY HISTORY:  Significant for cardiovascular disease.  She has a sister who had a myocardial infarction in her 82s.  Other siblings with diabetes and hypertension.  The patient's father died in his mid 46s from presumed heart disease and her mother died in her late 4s due to complications from a seizure disorder.  REVIEW OF SYSTEMS:  As outlined above.  She denies any palpitations or  syncope.  Has baseline NYHA class II dyspnea on exertion.  Does not exercise regularly.  Denies any melena or hematochezia.  No unusual weight loss, no fevers or chills.  Otherwise negative.  PHYSICAL EXAMINATION:  VITAL SIGNS:  Temperature is 97.5 degrees, heart rate 62 and regular, respirations 20, blood pressure is 120/74, oxygen saturation is 95% on room air.  Weight is 107 kg. GENERAL:  This is an obese woman, in no acute distress. HEENT:  Conjunctiva and lids are normal, oropharynx is clear with moist mucosa. NECK:  Supple.  No elevated JVP or carotid bruits.  No  thyromegaly. LUNGS:  Clear without labored breathing at rest. CARDIAC EXAM:  Reveals a regular rate and rhythm, no significant murmur or S3 gallop, no rub. ABDOMEN:  Obese, nontender, no guarding or rebound, bowel sounds present. SKIN:  Warm and dry. MUSCULOSKELETAL:  No kyphosis. EXTREMITIES:  No pitting edema, distal pulses full. NEUROPSYCHIATRIC:  Alert and oriented x3, affect appropriate.  LABORATORY DATA:  WBCs 7.8, hemoglobin 14.0, hematocrit 42.6, platelets 230.  Sodium 140, potassium 4.5, chloride 104, bicarb 25, glucose 98, BUN 8, creatinine 0.6, AST 20, ALT 16.  Hemoglobin A1c 5.8.  Peak CK 78, peak CK-MB 1.7.  Troponin-I less than 0.30.  LDL is 76.  TSH is 1.52.  IMPRESSION: 1. Atypical chest pain syndrome, prolonged and waxing/waning over the     last few days.  ECG does not show any acute injury pattern, and     cardiac markers were normal at this point.  She continues however     to have symptoms in the setting of obesity, long-standing tobacco     abuse, and a family history of premature cardiovascular disease.     She reports progression and difference in quality of chest pain     symptoms since ischemic evaluation in May, 2011. 2. History of gastroesophageal reflux disease with hiatal hernia and     Barrett esophagus.  The patient does report some dysphagia     symptoms, and it is possible that she will need further     Gastroenterology evaluation ultimately. 3. Obesity. 4. Abnormal hemoglobin A1c, consider glucose intolerance, developing     diabetes mellitus. 5. Long-standing tobacco abuse. 6. LDL 67, on no specific medical therapy.  RECOMMENDATIONS:  I discussed the plan with Dr. Brien Few, the hospitalist service as well as the patient.  She will be scheduled for an inpatient Lexiscan Myoview for tomorrow for objective evaluation of ischemia.  If this study is reassuring and she remains clinically stable, more than likely she can be managed medically with plan for  risk factor modification and management by her primary care physician, also possibly referral back to her gastroenterologist for further assessment.  If cardiac testing is significantly abnormal, we may need to consider additional studies as an inpatient.  Dr. Nida Boatman will be rounding at [facility] tomorrow for service, and can make further plans from there.     Jonelle Sidle, MD     SGM/MEDQ  D:  09/16/2011  T:  09/16/2011  Job:  [id number]  cc:   Lupe Carney, Dr.  Isidor Holts, M.D.  Electronically Signed by Nona Dell MD on 10/01/2011 01:45:40 PM

## 2011-10-03 ENCOUNTER — Other Ambulatory Visit (HOSPITAL_COMMUNITY): Payer: 59

## 2011-10-05 ENCOUNTER — Encounter (HOSPITAL_COMMUNITY)
Admission: RE | Admit: 2011-10-05 | Discharge: 2011-10-05 | Disposition: A | Discharge status: Home / Self Care | Payer: 59 | Source: Ambulatory Visit | Attending: Gastroenterology | Admitting: Gastroenterology

## 2011-10-05 DIAGNOSIS — R079 Chest pain, unspecified: Secondary | ICD-10-CM | POA: Insufficient documentation

## 2011-10-05 DIAGNOSIS — R110 Nausea: Secondary | ICD-10-CM | POA: Insufficient documentation

## 2011-10-05 DIAGNOSIS — R1011 Right upper quadrant pain: Secondary | ICD-10-CM | POA: Insufficient documentation

## 2011-10-05 MED ORDER — TECHNETIUM TC 99M MEBROFENIN IV KIT
5.0000 | PACK | Freq: Once | INTRAVENOUS | Status: AC | PRN
  Administered 2011-10-05: 5 via INTRAVENOUS

## 2011-11-05 ENCOUNTER — Emergency Department (HOSPITAL_COMMUNITY): Payer: 59

## 2011-11-05 ENCOUNTER — Encounter: Payer: Self-pay | Admitting: *Deleted

## 2011-11-05 ENCOUNTER — Emergency Department (HOSPITAL_COMMUNITY)
Admission: EM | Admit: 2011-11-05 | Discharge: 2011-11-05 | Disposition: A | Discharge status: Home / Self Care | Payer: 59 | Attending: Emergency Medicine | Admitting: Emergency Medicine

## 2011-11-05 DIAGNOSIS — F411 Generalized anxiety disorder: Secondary | ICD-10-CM | POA: Insufficient documentation

## 2011-11-05 DIAGNOSIS — R079 Chest pain, unspecified: Secondary | ICD-10-CM | POA: Insufficient documentation

## 2011-11-05 DIAGNOSIS — F419 Anxiety disorder, unspecified: Secondary | ICD-10-CM

## 2011-11-05 HISTORY — DX: Anxiety disorder, unspecified: F41.9

## 2011-11-05 HISTORY — DX: Gastro-esophageal reflux disease without esophagitis: K21.9

## 2011-11-05 LAB — COMPREHENSIVE METABOLIC PANEL
AST: 11 U/L (ref 0–37)
Albumin: 3.6 g/dL (ref 3.5–5.2)
CO2: 22 mEq/L (ref 19–32)
Calcium: 8.7 mg/dL (ref 8.4–10.5)
Creatinine, Ser: 0.65 mg/dL (ref 0.50–1.10)
GFR calc non Af Amer: >90 mL/min (ref >90)
Sodium: 136 mEq/L (ref 135–145)
Total Protein: 6.2 g/dL (ref 6.0–8.3)

## 2011-11-05 LAB — CARDIAC PANEL(CRET KIN+CKTOT+MB+TROPI)
CK, MB: 1.9 ng/mL (ref 0.3–4.0)
Relative Index: INVALID (ref 0.0–2.5)
Troponin I: <0.3 ng/mL (ref <0.30)

## 2011-11-05 LAB — CBC
MCH: 30.1 pg (ref 26.0–34.0)
MCHC: 34.1 g/dL (ref 30.0–36.0)
MCV: 88.3 fL (ref 78.0–100.0)
Platelets: 215 10*3/uL (ref 150–400)
RDW: 12.8 % (ref 11.5–15.5)

## 2011-11-05 MED ORDER — LORAZEPAM 2 MG/ML IJ SOLN
1.0000 mg | Freq: Once | INTRAMUSCULAR | Status: AC
  Administered 2011-11-05: 1 mg via INTRAVENOUS
  Filled 2011-11-05: qty 1

## 2011-11-05 NOTE — ED Provider Notes (Signed)
History     CSN: [id number] Arrival date & time: 11/05/2011 12:47 AM   First MD Initiated Contact with Patient 11/05/11 0051      Chief Complaint  Patient presents with  . Anxiety     HPI The patient presents approximately 2 hours after the sudden development of shakiness, chest pain, back pain, nervousness. She notes that she was on a date when her symptoms began. On arrival she notes her symptoms have improved, moderately, without any intervention. The pain in her chest as described as pressure-like, diffuse, anterior, otherwise nonradiating. The pain is nonpleuritic, nor exertional. No lightheadedness, no syncope. The patient has been in her usual state of health. The patient notes a similar episode of chest pain 2 months ago resulting in admission with provocative testing. Stress test 09/17/2011 was normal with an EF of 72% Past Medical History  Diagnosis Date  . Anxiety   . GERD (gastroesophageal reflux disease)     History reviewed. No pertinent past surgical history.  History reviewed. No pertinent family history.  History  Substance Use Topics  . Smoking status: Not on file  . Smokeless tobacco: Not on file  . Alcohol Use:     OB History    Grav Para Term Preterm Abortions TAB SAB Ect Mult Living                  Review of Systems  All other systems reviewed and are negative.    Allergies  Review of patient's allergies indicates no known allergies.  Home Medications   Current Outpatient Rx  Name Route Sig Dispense Refill  . ALPRAZOLAM 0.25 MG PO TABS Oral Take 0.25 mg by mouth at bedtime as needed.      Marland Kitchen RABEPRAZOLE SODIUM 20 MG PO TBEC Oral Take 20 mg by mouth daily.        BP 126/67  Pulse 85  Temp(Src) 98.1 F (36.7 C) (Oral)  Resp 18  SpO2 95%  Physical Exam  Constitutional: She is oriented to person, place, and time. She appears well-developed and well-nourished.  HENT:  Head: Normocephalic and atraumatic.  Eyes: EOM are normal.    Cardiovascular: Normal rate and regular rhythm.   Pulmonary/Chest: Effort normal and breath sounds normal.  Abdominal: She exhibits no distension.  Musculoskeletal: She exhibits no edema and no tenderness.  Neurological: She is alert and oriented to person, place, and time.  Skin: Skin is warm and dry.    ED Course  Procedures (including critical care time)   Labs Reviewed  CBC  COMPREHENSIVE METABOLIC PANEL  CARDIAC PANEL(CRET KIN+CKTOT+MB+TROPI)   No results found.   No diagnosis found.   Date: 11/05/2011  Rate: 86  Rhythm: normal sinus rhythm  QRS Axis: normal  Intervals: normal  ST/T Wave abnormalities: normal  Conduction Disutrbances:none  Narrative Interpretation:   Old EKG Reviewed: unchanged  CXR: no acute findings    MDM  This 45 year old female presents with several hours of upper thoracic tingling and tightness. On exam the patient is in no distress though she is anxious. Given the absence of abnormal vital signs, abnormal labs, abnormal EKG, abnormal chest x-ray and with her relatively recent stress echocardiogram which was normal, the patient is safe for discharge with continued evaluation by her primary care team for this likely anxiety attack.        Gerhard Munch, MD 11/05/11 [phone]

## 2011-11-05 NOTE — ED Notes (Signed)
Per EMS:  Pt states that she was preparing for a night of supper and post supper entertainment when, to her dismay, her hands began to shake and she began to have a panic attack.  Pt called EMS and reports to [facility].  In route to [facility], an EKG was doen by EMS showing NSR.

## 2011-11-05 NOTE — ED Notes (Signed)
WRU:[id number]> Expected date:<BR> Expected time:<BR> Means of arrival:<BR> Comments:<BR> ems-chest pain palpitations.

## 2011-12-13 ENCOUNTER — Encounter: Payer: Self-pay | Admitting: Family Medicine

## 2011-12-13 ENCOUNTER — Other Ambulatory Visit: Payer: Self-pay | Admitting: Family Medicine

## 2011-12-13 ENCOUNTER — Ambulatory Visit
Admission: RE | Admit: 2011-12-13 | Discharge: 2011-12-13 | Disposition: A | Discharge status: Home / Self Care | Payer: 59 | Source: Ambulatory Visit | Attending: Family Medicine | Admitting: Family Medicine

## 2011-12-13 ENCOUNTER — Ambulatory Visit (INDEPENDENT_AMBULATORY_CARE_PROVIDER_SITE_OTHER): Payer: 59 | Admitting: Family Medicine

## 2011-12-13 DIAGNOSIS — K219 Gastro-esophageal reflux disease without esophagitis: Secondary | ICD-10-CM

## 2011-12-13 DIAGNOSIS — R0789 Other chest pain: Secondary | ICD-10-CM

## 2011-12-13 DIAGNOSIS — R071 Chest pain on breathing: Secondary | ICD-10-CM

## 2011-12-13 DIAGNOSIS — R109 Unspecified abdominal pain: Secondary | ICD-10-CM

## 2011-12-13 MED ORDER — HYOSCYAMINE SULFATE 0.125 MG SL SUBL: 0.1250 mg | SUBLINGUAL_TABLET | SUBLINGUAL | Status: AC | PRN

## 2011-12-13 MED ORDER — NAPROXEN 500 MG PO TABS: 500.0000 mg | ORAL_TABLET | Freq: Two times a day (BID) | ORAL | Status: DC
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
## 2011-12-13 NOTE — Patient Instructions (Signed)
Continue your omeprazole (we will call if we get Dexilant samples to try in its place) Take naprosyn twice daily with food,  Moist compresses might also help with chest pain. Use the sublingual medication when you have abdominal pain. Go for chest x-ray.  We will let you know if an MRI is indicated (we may make this decision at your follow up in 2 weeks)

## 2011-12-14 ENCOUNTER — Telehealth: Payer: Self-pay | Admitting: *Deleted

## 2011-12-14 NOTE — Telephone Encounter (Signed)
Error

## 2011-12-18 ENCOUNTER — Telehealth: Payer: Self-pay | Admitting: *Deleted

## 2011-12-18 MED ORDER — DEXLANSOPRAZOLE 60 MG PO CPDR: 60.0000 mg | DELAYED_RELEASE_CAPSULE | Freq: Every day | ORAL | Status: DC

## 2011-12-18 NOTE — Telephone Encounter (Signed)
Lexapro will help decrease anxiety in the long run, not immediately, and sometimes can increase anxiety in the first week or so.  Make sure she started back at 1/2 tablet, then after a few days, increase to full tablet.  Keep at 10mg  for now (so not increasing the risk further), and may need to use alprazolam in the short term.  Okay to refill #30 of alprazolam if she needs it

## 2011-12-18 NOTE — Telephone Encounter (Signed)
Spoke with patient and she states that she did start with 1/2 10mg  Friday and will be up to 10mg  today. She will use alprazolam in the short term for this anxiety, pt refused rx to be called in stated she had some of this medication on hand.

## 2011-12-18 NOTE — Telephone Encounter (Signed)
Patient called me this am wanting to let you know that she has had terrible anxiety attacks all weekend long, she believes due to her pain. She started back on Lexapro 10mg  (given to her by Dr.Mitchell) and she thinks that she was previously on 20mg  but is not sure. Is it okay for her to take two 10mg  of her Lexapro? She has enough for 5 days if this is ok. She stated that she is still having real bad muscle spasms along with pain. Naprosyn working for inflammation but not for pain. Please advise. Thanks.

## 2011-12-19 ENCOUNTER — Ambulatory Visit (INDEPENDENT_AMBULATORY_CARE_PROVIDER_SITE_OTHER): Payer: 59 | Admitting: Medical

## 2011-12-19 ENCOUNTER — Encounter: Payer: Self-pay | Admitting: Medical

## 2011-12-19 DIAGNOSIS — R1011 Right upper quadrant pain: Secondary | ICD-10-CM | POA: Insufficient documentation

## 2011-12-19 DIAGNOSIS — R109 Unspecified abdominal pain: Secondary | ICD-10-CM

## 2011-12-19 LAB — POCT URINALYSIS DIPSTICK
Nitrite, UA: NEGATIVE
Protein, UA: NEGATIVE
Spec Grav, UA: <=1.005
Urobilinogen, UA: NEGATIVE
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
## 2011-12-28 ENCOUNTER — Ambulatory Visit (INDEPENDENT_AMBULATORY_CARE_PROVIDER_SITE_OTHER): Payer: 59 | Admitting: Family Medicine

## 2011-12-28 ENCOUNTER — Encounter: Payer: Self-pay | Admitting: Family Medicine

## 2011-12-28 DIAGNOSIS — F419 Anxiety disorder, unspecified: Secondary | ICD-10-CM | POA: Insufficient documentation

## 2011-12-28 DIAGNOSIS — R0789 Other chest pain: Secondary | ICD-10-CM

## 2011-12-28 DIAGNOSIS — R071 Chest pain on breathing: Secondary | ICD-10-CM

## 2011-12-28 DIAGNOSIS — F411 Generalized anxiety disorder: Secondary | ICD-10-CM

## 2011-12-28 DIAGNOSIS — R1013 Epigastric pain: Secondary | ICD-10-CM

## 2011-12-28 MED ORDER — DEXLANSOPRAZOLE 60 MG PO CPDR: 60.0000 mg | DELAYED_RELEASE_CAPSULE | Freq: Every day | ORAL | Status: DC

## 2011-12-28 MED ORDER — ESCITALOPRAM OXALATE 10 MG PO TABS: 10.0000 mg | ORAL_TABLET | Freq: Every day | ORAL | Status: DC

## 2011-12-28 NOTE — Patient Instructions (Signed)
  Turmeric/curcumin--may have anti-inflammatory benefit.  Do not use it along with anti-inflammatories or may increase risk for bleeding.  Feel free to try it, and if not helping with your pain, switch back to the Naprosyn.  Continue with Dexilant.  Eventually, if not cost effective, you can consider going back to previous acid-reducing medications, knowing you should restart the Dexilant if pain recurs with those other medications.  Continue the Lexapro.  Use the alprazolam only if needed for severe anxiety.  I recommend heat, massage and regular neck stretches for your neck pain.
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
## 2012-01-17 ENCOUNTER — Telehealth: Payer: Self-pay | Admitting: *Deleted

## 2012-01-17 NOTE — Telephone Encounter (Signed)
Patient called and said she is still unable to afford Dexilant. $65 - $20 copay card = $45 a month and she cannot afford. She said that you told her that you could fill out some paperwork if she was still unable to afford this medication. Not sure if she is talking about a prior auth maybe? Please advise. Thanks.

## 2012-01-17 NOTE — Telephone Encounter (Signed)
Sounds like she is talking about prior auth, but I don't know if that is the case for this med.  If the pharmacist says prior Berkley Harvey is needed, we should be able to get it for her.  If it is COVERED, but just at that high copay, then there isn't anything we can do, other than offer to help her out with samples.  She may not really need this med longterm--she might get by with being on it for a few months, and then switch back to a med that is a lower cost for her

## 2012-01-18 ENCOUNTER — Telehealth: Payer: Self-pay | Admitting: *Deleted

## 2012-01-18 DIAGNOSIS — R1013 Epigastric pain: Secondary | ICD-10-CM

## 2012-01-18 MED ORDER — DEXLANSOPRAZOLE 60 MG PO CPDR: 60.0000 mg | DELAYED_RELEASE_CAPSULE | Freq: Every day | ORAL | Status: DC

## 2012-01-18 NOTE — Telephone Encounter (Signed)
Spoke with patient and she said it was not a prior [place]. She agreed to [place] helping her out with samples and she will call back in a few months and maybe we can change her to a lower cost medication at that time. Samples given.

## 2012-02-01 ENCOUNTER — Telehealth: Payer: Self-pay | Admitting: *Deleted

## 2012-02-01 NOTE — Telephone Encounter (Signed)
Patient called and wanted you to know that on Tuesday night she had a "panic attack." She said she went to bed Tuesday night and her heart rate felt like it was accelerating through the roof. She got up and went into the living room and her arms starting shaking violently and uncontrollably. Her mouth also starting trembling and drawing up. She took a xanax and this continued. She then took 1/2 a xanax and chewed it she was so scared, after that she fell asleep. She wanted you to know about this and wanted to know if you had any recommendations.  She did also tell me that on her 8th day of taking Lexapro 5mg  she did up her dosage to 10mg  and she felt like it was too much, felt drunk. She went back down to 5 mg on day #9 and then back to 10mg  yesterday (day 10) and she felt so bad she slept most of the day. Today she took 10mg  also, she is at work and she is kind of dragging. She also noted that noise bothers her a lot on the 10mg .

## 2012-02-01 NOTE — Telephone Encounter (Signed)
Spoke with patient and advised her of Dr.Knapp's recommendations. Pt verbalized understanding.

## 2012-02-01 NOTE — Telephone Encounter (Signed)
Noted.  She can change her dosing of Lexapro to taking it in the evening if it makes her sleepy. Continue with the 10mg  of Lexapro, using the xanax only if needed for severe anxiety/panic attack.  Have her keep track of symptoms.  When heart is racing, have her count pulse (if >130 then I'm concerned it may be more than just anxiety).  Bring any notes she takes regarding her symptoms to her f/u visit

## 2012-02-05 ENCOUNTER — Ambulatory Visit (INDEPENDENT_AMBULATORY_CARE_PROVIDER_SITE_OTHER): Payer: 59 | Admitting: Family Medicine

## 2012-02-05 ENCOUNTER — Encounter: Payer: Self-pay | Admitting: Family Medicine

## 2012-02-05 ENCOUNTER — Telehealth: Payer: Self-pay | Admitting: Family Medicine

## 2012-02-05 VITALS — BP 122/82 | HR 67 | Temp 98.5°F | Ht 65.0 in | Wt 229.0 lb

## 2012-02-05 DIAGNOSIS — J209 Acute bronchitis, unspecified: Secondary | ICD-10-CM

## 2012-02-05 DIAGNOSIS — F17200 Nicotine dependence, unspecified, uncomplicated: Secondary | ICD-10-CM

## 2012-02-05 MED ORDER — AMOXICILLIN 875 MG PO TABS: 875.0000 mg | ORAL_TABLET | Freq: Two times a day (BID) | ORAL | Status: DC

## 2012-02-05 NOTE — Telephone Encounter (Signed)
Pt called states she is having pains in chest back started over the weekend. Pt wanted to know if she should come in and appointment was scheduled

## 2012-02-05 NOTE — Progress Notes (Signed)
  Subjective:    Patient ID: Christine Shah, female    DOB: [birth year], 46 y.o.   MRN: [id number]  HPI 2 days ago she started having difficulty with a sore throat followed shortly by productive cough, but no fever, chills or earache, nasal congestion or PND.Marland Kitchen Her symptoms have continued and now she is having difficulty with fatigue. She does smoke.   Review of Systems     Objective:   Physical Exam alert and in no distress. Tympanic membranes and canals are normal. Throat is clear. Tonsils are normal. Neck is supple without adenopathy or thyromegaly. Cardiac exam shows a regular sinus rhythm without murmurs or gallops. Lungs are clear to auscultation.        Assessment & Plan:   1. Acute bronchitis  amoxicillin (AMOXIL) 875 MG tablet  2. Current smoker     encouraged her to quit smoking and call if she wants our help. She will also call if not entirely back to normal.

## 2012-02-05 NOTE — Patient Instructions (Addendum)
Take all the antibiotic and call if not totally back to normal When you are ready to quit smoking, give [place] a call

## 2012-02-14 ENCOUNTER — Telehealth: Payer: Self-pay | Admitting: *Deleted

## 2012-02-14 DIAGNOSIS — K219 Gastro-esophageal reflux disease without esophagitis: Secondary | ICD-10-CM

## 2012-02-14 MED ORDER — OMEPRAZOLE 40 MG PO CPDR: 40.0000 mg | DELAYED_RELEASE_CAPSULE | Freq: Every day | ORAL | Status: DC

## 2012-02-14 NOTE — Telephone Encounter (Signed)
Patient called and stated that Dexilant works well but she cannot afford rx, would you be able to call in omeprazole 40mg  qd, she stated that Dr.Medoff had rx'd this for her in the past.

## 2012-02-16 ENCOUNTER — Telehealth: Payer: Self-pay

## 2012-02-16 MED ORDER — AMOXICILLIN-POT CLAVULANATE 875-125 MG PO TABS: 1.0000 | ORAL_TABLET | Freq: Two times a day (BID) | ORAL | Status: AC

## 2012-02-16 NOTE — Telephone Encounter (Signed)
Pt informed and agreed med sent in

## 2012-02-16 NOTE — Telephone Encounter (Signed)
Pt agreed and I sent med in

## 2012-02-16 NOTE — Telephone Encounter (Signed)
Pt called and said she is having LRQ pain going into lower back not sure if muscle or what also she still has green mucus from cough and nose  Blowing please advise on what to do

## 2012-02-16 NOTE — Telephone Encounter (Signed)
If mucus is still green, and never improved with the amoxacillin rx'd by Dr. Susann Givens, then needs to have ABX changed.  Change to Augmentin 875mg  BID x 10 days.  She needs to quit smoking. (order is pended--if she is agreeable to change, send to pharmacy)  If she is having muscular pain, can try moist heat, stretches, and can take naproxen (previously prescribed for her) or tylenol, or make appt for evaluation if not improving.

## 2012-02-26 ENCOUNTER — Telehealth: Payer: Self-pay | Admitting: *Deleted

## 2012-02-26 MED ORDER — FLUCONAZOLE 150 MG PO TABS: 150.0000 mg | ORAL_TABLET | Freq: Once | ORAL | Status: AC

## 2012-02-26 NOTE — Telephone Encounter (Signed)
E-rx'd.  Do you need to advise pt done?

## 2012-02-26 NOTE — Telephone Encounter (Signed)
Patient called and stated that she was given Augmentin 02/16/12, she now has a yeast infection from the abx. Can you call in Diflucan to [facility]? Please advise. Thanks.

## 2012-02-26 NOTE — Telephone Encounter (Signed)
Pt advised that Diflucan was called into [facility].

## 2012-03-27 ENCOUNTER — Telehealth: Payer: Self-pay | Admitting: *Deleted

## 2012-03-27 MED ORDER — CYCLOBENZAPRINE HCL 10 MG PO TABS: 5.0000 mg | ORAL_TABLET | Freq: Three times a day (TID) | ORAL | Status: AC | PRN

## 2012-03-27 NOTE — Telephone Encounter (Signed)
Would like to know if you could call in a muscle relaxer as she has been having some sore muscles in her chest and back. She notices that her muscles hurt more after doing such things as mow the grass, weed eat and change her oil in her car. Or does she need appt? Thanks. [facility].

## 2012-03-27 NOTE — Telephone Encounter (Signed)
Okay for flexeril.  Remind pt of extreme sedation it can cause, so although rx states it can be used up to three times daily, most people can really only use it at bedtime due to sedation.  Use extreme caution if being used during the day (ie with driving, etc.). Rx sent to her pharmacy

## 2012-03-27 NOTE — Telephone Encounter (Signed)
Patient informed. 
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
## 2012-03-27 NOTE — Telephone Encounter (Signed)
Left message for patient to return my call.

## 2012-03-27 NOTE — Telephone Encounter (Signed)
I believe she told me that she had used valium as a muscle relaxant in the past. She probably would do best with using tylenol or NSAIDs (as long as she is on PPI) for muscle PAIN, and only use valium for SPASM.  Soreness in muscles is not the same as spasm. She was prescribed naprosyn by [place] in the past--she can either use that, or an OTC Aleve or ibuprofen, but NOT BOTH.  If she would like valium again, I'm willing to refill #20.  Does she know previous dose she took? 2mg ? If she would like to discuss more, than OV is recommended.

## 2012-04-22 ENCOUNTER — Encounter: Payer: Self-pay | Admitting: Family Medicine

## 2012-04-22 ENCOUNTER — Ambulatory Visit (INDEPENDENT_AMBULATORY_CARE_PROVIDER_SITE_OTHER): Payer: 59 | Admitting: Family Medicine

## 2012-04-22 VITALS — BP 130/80 | HR 97

## 2012-04-22 DIAGNOSIS — M549 Dorsalgia, unspecified: Secondary | ICD-10-CM

## 2012-04-22 DIAGNOSIS — M546 Pain in thoracic spine: Secondary | ICD-10-CM

## 2012-04-22 NOTE — Progress Notes (Signed)
  Subjective:    Patient ID: Christine Shah, female    DOB: [birth year], 46 y.o.   MRN: [id number]  HPI She complains of a several month history of upper back and chest discomfort and she has been using Flexeril for this. In the last 3 days she has noted intermittent right upper back pain with radiation into the mid chest area. She does get slightly short of breath and have a cough with this and notes that stress does make this worse. The symptoms last for 5-30 minutes. She's had no fever, chills, cough, congestion.  Review of Systems     Objective:   Physical Exam Alert and crying. No tenderness to palpation of her upper right back area or shoulder. Cardiac exam shows regular rhythm without murmurs or gallops. Lungs are clear to auscultation. No pain on motion of the back area.       Assessment & Plan:   1. Upper back pain on right side    explained that the etiology of her symptoms is at this time unclear but I do not think she is in any danger. Recommend heat, stretching and add Advil 4 tablets 3 times per day to the regimen. She is to call if continued difficulty.

## 2012-04-22 NOTE — Patient Instructions (Signed)
Continue on your Flexeril and add Advil 4 tablets 3 times a day. Heat to your back for 20 minutes 3 times a day. When you have symptoms, keep track of what makes it better or worse. If further difficulty, back

## 2012-07-08 ENCOUNTER — Other Ambulatory Visit: Payer: Self-pay | Admitting: Family Medicine

## 2012-07-08 ENCOUNTER — Telehealth: Payer: Self-pay | Admitting: Family Medicine

## 2012-07-08 DIAGNOSIS — F411 Generalized anxiety disorder: Secondary | ICD-10-CM

## 2012-07-08 MED ORDER — ESCITALOPRAM OXALATE 10 MG PO TABS: 10.0000 mg | ORAL_TABLET | Freq: Every day | ORAL | Status: DC

## 2012-07-08 NOTE — Telephone Encounter (Signed)
Called pt to r/s her appt from 8/8 to 8/26.  She will be out of Lexapro 10 mg  Called [facility] to refill #30 until she can get in for her appt.

## 2012-07-09 ENCOUNTER — Telehealth: Payer: Self-pay | Admitting: Family Medicine

## 2012-07-10 ENCOUNTER — Telehealth: Payer: Self-pay | Admitting: Family Medicine

## 2012-07-10 NOTE — Telephone Encounter (Signed)
LM

## 2012-07-11 ENCOUNTER — Other Ambulatory Visit: Payer: Self-pay | Admitting: Family Medicine

## 2012-07-11 ENCOUNTER — Institutional Professional Consult (permissible substitution): Payer: 59 | Admitting: Family Medicine

## 2012-07-29 ENCOUNTER — Ambulatory Visit (INDEPENDENT_AMBULATORY_CARE_PROVIDER_SITE_OTHER): Payer: 59 | Admitting: Family Medicine

## 2012-07-29 ENCOUNTER — Encounter: Payer: Self-pay | Admitting: Family Medicine

## 2012-07-29 VITALS — BP 100/60 | HR 76 | Ht 65.0 in | Wt 241.0 lb

## 2012-07-29 DIAGNOSIS — F17200 Nicotine dependence, unspecified, uncomplicated: Secondary | ICD-10-CM

## 2012-07-29 DIAGNOSIS — R071 Chest pain on breathing: Secondary | ICD-10-CM

## 2012-07-29 DIAGNOSIS — R0789 Other chest pain: Secondary | ICD-10-CM

## 2012-07-29 DIAGNOSIS — R1011 Right upper quadrant pain: Secondary | ICD-10-CM

## 2012-07-29 DIAGNOSIS — R635 Abnormal weight gain: Secondary | ICD-10-CM

## 2012-07-29 DIAGNOSIS — F411 Generalized anxiety disorder: Secondary | ICD-10-CM

## 2012-07-29 LAB — TSH: TSH: 1.209 u[IU]/mL (ref 0.350–4.500)

## 2012-07-29 LAB — CBC WITH DIFFERENTIAL/PLATELET
Basophils Absolute: 0.1 10*3/uL (ref 0.0–0.1)
Basophils Relative: 1 % (ref 0–1)
Eosinophils Relative: 7 % — ABNORMAL HIGH (ref 0–5)
HCT: 40.1 % (ref 36.0–46.0)
MCHC: 33.2 g/dL (ref 30.0–36.0)
MCV: 85.1 fL (ref 78.0–100.0)
Monocytes Absolute: 0.5 10*3/uL (ref 0.1–1.0)
RDW: 14.5 % (ref 11.5–15.5)

## 2012-07-29 LAB — COMPREHENSIVE METABOLIC PANEL
AST: 14 U/L (ref 0–37)
Alkaline Phosphatase: 67 U/L (ref 39–117)
BUN: 9 mg/dL (ref 6–23)
Creat: 0.8 mg/dL (ref 0.50–1.10)
Total Bilirubin: 0.4 mg/dL (ref 0.3–1.2)

## 2012-07-29 MED ORDER — NAPROXEN 500 MG PO TABS: 500.0000 mg | ORAL_TABLET | Freq: Two times a day (BID) | ORAL | Status: DC

## 2012-07-29 MED ORDER — ESCITALOPRAM OXALATE 10 MG PO TABS: 10.0000 mg | ORAL_TABLET | Freq: Every day | ORAL | Status: DC

## 2012-07-29 MED ORDER — DEXLANSOPRAZOLE 60 MG PO CPDR: 60.0000 mg | DELAYED_RELEASE_CAPSULE | Freq: Every day | ORAL | Status: DC
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
## 2012-07-29 NOTE — Patient Instructions (Signed)
Take the Dexilant in place of the omeprazole to help treat the reflux (is a stronger medication that has worked for you in the past). Once you finish samples, go back to omeprazole, or see what other Proton Pump Inhibitors are covered by your insurance and are affordable and we can try those.  Please try and quit smoking.  Please try and get at least 30 minutes of exercise daily.  Consider seeing doctor for your arm pain through [employer] (as any physical therapy might be more affordable that way).  Start taking the naprosyn to see if that helps with the pain.  Make sure to be on the dexilant when taking the naprosyn so it doesn't bother your stomach more.

## 2012-07-30 ENCOUNTER — Encounter: Payer: Self-pay | Admitting: Family Medicine

## 2012-08-27 ENCOUNTER — Telehealth: Payer: Self-pay | Admitting: Family Medicine

## 2012-08-27 DIAGNOSIS — R1011 Right upper quadrant pain: Secondary | ICD-10-CM

## 2012-08-27 MED ORDER — DEXLANSOPRAZOLE 60 MG PO CPDR: 60.0000 mg | DELAYED_RELEASE_CAPSULE | Freq: Every day | ORAL | Status: DC

## 2012-08-27 NOTE — Telephone Encounter (Signed)
Per Dr. Lynelle Doctor, patient given Dexilant 60mg  samples #25  Informed patient

## 2012-08-27 NOTE — Telephone Encounter (Signed)
Wants dexilant samples     Informed patient that Dr. Lynelle Doctor out of office on Tuesdays, would probably be Wednesday before she would get call back  Please call patient if available or not

## 2012-08-27 NOTE — Telephone Encounter (Signed)
Okay for Dexilant samples (for 2-3 weeks)

## 2012-09-10 ENCOUNTER — Other Ambulatory Visit: Payer: Self-pay | Admitting: Family Medicine

## 2012-09-10 DIAGNOSIS — K219 Gastro-esophageal reflux disease without esophagitis: Secondary | ICD-10-CM

## 2012-09-10 NOTE — Telephone Encounter (Signed)
Is this ok?

## 2012-09-10 NOTE — Telephone Encounter (Signed)
Called pharmacy they said they saw the refills now and said sorry

## 2012-09-10 NOTE — Telephone Encounter (Signed)
According to computer, I did refill x 6 months for her Lexapro at her [facility]  She shouldn't need refill on Lexapro (?using an old rx number?)--they should have refills on file.    Okay for the omeprazole refill x 6 months.  Please check with pharmacy, and complete documentation for refill.  Thanks

## 2012-09-12 ENCOUNTER — Other Ambulatory Visit: Payer: Self-pay | Admitting: *Deleted

## 2012-09-12 DIAGNOSIS — K219 Gastro-esophageal reflux disease without esophagitis: Secondary | ICD-10-CM

## 2012-09-12 MED ORDER — OMEPRAZOLE 40 MG PO CPDR: 40.0000 mg | DELAYED_RELEASE_CAPSULE | Freq: Every day | ORAL | Status: DC

## 2012-12-02 ENCOUNTER — Ambulatory Visit (INDEPENDENT_AMBULATORY_CARE_PROVIDER_SITE_OTHER): Payer: 59 | Admitting: Medical

## 2012-12-02 ENCOUNTER — Encounter: Payer: Self-pay | Admitting: Medical

## 2012-12-02 VITALS — BP 112/78 | HR 85 | Temp 98.1°F | Resp 16 | Wt 239.0 lb

## 2012-12-02 DIAGNOSIS — R600 Localized edema: Secondary | ICD-10-CM

## 2012-12-02 DIAGNOSIS — I872 Venous insufficiency (chronic) (peripheral): Secondary | ICD-10-CM

## 2012-12-02 DIAGNOSIS — R609 Edema, unspecified: Secondary | ICD-10-CM

## 2012-12-02 DIAGNOSIS — F17200 Nicotine dependence, unspecified, uncomplicated: Secondary | ICD-10-CM

## 2012-12-02 DIAGNOSIS — I8390 Asymptomatic varicose veins of unspecified lower extremity: Secondary | ICD-10-CM

## 2012-12-02 DIAGNOSIS — I739 Peripheral vascular disease, unspecified: Secondary | ICD-10-CM
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
## 2012-12-03 ENCOUNTER — Other Ambulatory Visit (HOSPITAL_COMMUNITY): Payer: Self-pay | Admitting: Medical

## 2012-12-05 ENCOUNTER — Other Ambulatory Visit (HOSPITAL_COMMUNITY): Payer: Self-pay | Admitting: Medical

## 2012-12-05 DIAGNOSIS — R609 Edema, unspecified: Secondary | ICD-10-CM

## 2012-12-11 ENCOUNTER — Ambulatory Visit (HOSPITAL_COMMUNITY)
Admission: RE | Admit: 2012-12-11 | Discharge: 2012-12-11 | Disposition: A | Discharge status: Home / Self Care | Payer: 59 | Source: Ambulatory Visit | Attending: Medical | Admitting: Medical

## 2012-12-11 DIAGNOSIS — R609 Edema, unspecified: Secondary | ICD-10-CM

## 2012-12-11 DIAGNOSIS — M7989 Other specified soft tissue disorders: Secondary | ICD-10-CM | POA: Insufficient documentation

## 2012-12-11 NOTE — Progress Notes (Signed)
ABI only completed. Chauncy Lean

## 2012-12-18 ENCOUNTER — Telehealth: Payer: Self-pay | Admitting: Family Medicine

## 2012-12-18 NOTE — Telephone Encounter (Signed)
I do recommend the prescription Ted hose.   Also, try either OTC magnesium and potassium daily supplement OR 1/2 cup of tonic water OTC daily for leg pain.   If this doesn't help in wk, then recheck.

## 2012-12-18 NOTE — Telephone Encounter (Signed)
Patient said that she has been using knee high stockings. She has not got the compression hose's yet. She said the swelling comes and goes but the pain is there and it is moving to her thigh. She said that she is excising. She has equipment at home and she has changed the way she eats. CLS

## 2012-12-18 NOTE — Telephone Encounter (Signed)
Message copied by Janeice Robinson on Wed Dec 18, 2012  1:49 PM ------      Message from: Aleen Campi, DAVID S      Created: Wed Dec 18, 2012  9:00 AM       Her BP/ABIs of her legs were normal, no obvious blockages in legs.  See if she is using the Ted hose?  How is the swelling?  Is she exercising?

## 2012-12-20 NOTE — Telephone Encounter (Signed)
Patient was informed of Kristian Covey PA-C recommendations for her treatment of leg pain. CLS

## 2013-01-10 ENCOUNTER — Encounter: Payer: Self-pay | Admitting: Family Medicine

## 2013-01-10 ENCOUNTER — Ambulatory Visit (INDEPENDENT_AMBULATORY_CARE_PROVIDER_SITE_OTHER): Payer: 59 | Admitting: Family Medicine

## 2013-01-10 VITALS — BP 110/70 | HR 100 | Temp 101.0°F

## 2013-01-10 DIAGNOSIS — F17200 Nicotine dependence, unspecified, uncomplicated: Secondary | ICD-10-CM

## 2013-01-10 DIAGNOSIS — J209 Acute bronchitis, unspecified: Secondary | ICD-10-CM

## 2013-01-10 DIAGNOSIS — R05 Cough: Secondary | ICD-10-CM

## 2013-01-10 MED ORDER — AMOXICILLIN 875 MG PO TABS: 875.0000 mg | ORAL_TABLET | Freq: Two times a day (BID) | ORAL | Status: DC

## 2013-01-10 MED ORDER — ALBUTEROL SULFATE HFA 108 (90 BASE) MCG/ACT IN AERS: 2.0000 | INHALATION_SPRAY | Freq: Four times a day (QID) | RESPIRATORY_TRACT | Status: DC | PRN

## 2013-01-10 NOTE — Patient Instructions (Addendum)
Take 4 Advil 3 times per day for the fever aches and pains. Take all the antibiotic and if you're not totally back to normal when you finish give me a call. I'll work with you when you're rated quit smoking.

## 2013-01-10 NOTE — Progress Notes (Signed)
  Subjective:    Patient ID: Christine Shah, female    DOB: [birth year], 47 y.o.   MRN: [id number]  HPI 3 days ago she developed rhinorrhea ;2 days ago she developed a sore throat and burning sensation in her chest,myalgias, fatigue, fever cough with rib aching . 3 days ago at work they ran a line of plastic tubing that has a particular odor to it that she usually has trouble with. She does smoke   Review of Systems     Objective:   Physical Exam alert and in no distress. Tympanic membranes and canals are normal. Throat is clear. Tonsils are normal. Neck is supple without adenopathy or thyromegaly. Cardiac exam shows a regular sinus rhythm without murmurs or gallops. Lungs show slight expiratory wheezing. Chest x-ray shows no acute changes       Assessment & Plan:   1. Cough  CHEST X-RAY 2 VIEWS (71020), albuterol (PROVENTIL HFA;VENTOLIN HFA) 108 (90 BASE) MCG/ACT inhaler  2. Acute bronchitis  amoxicillin (AMOXIL) 875 MG tablet  3. Tobacco use disorder      discuss smoking cessation with her. Recommend she call me when she is ready. She is also to let me know she is not totally back to normal when she finishes the antibiotic.

## 2013-01-27 ENCOUNTER — Encounter: Payer: Self-pay | Admitting: Family Medicine

## 2013-01-27 ENCOUNTER — Ambulatory Visit (INDEPENDENT_AMBULATORY_CARE_PROVIDER_SITE_OTHER): Payer: 59 | Admitting: Family Medicine

## 2013-01-27 VITALS — BP 100/68 | HR 76 | Ht 65.0 in | Wt 237.0 lb

## 2013-01-27 DIAGNOSIS — N926 Irregular menstruation, unspecified: Secondary | ICD-10-CM

## 2013-01-27 DIAGNOSIS — F411 Generalized anxiety disorder: Secondary | ICD-10-CM

## 2013-01-27 DIAGNOSIS — F17200 Nicotine dependence, unspecified, uncomplicated: Secondary | ICD-10-CM

## 2013-01-27 DIAGNOSIS — K219 Gastro-esophageal reflux disease without esophagitis: Secondary | ICD-10-CM
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
## 2013-01-27 NOTE — Patient Instructions (Signed)
Continue to avoid cigarettes.  Wean off nicotine in e-cigarette.  Continue to try and lose weight; exercise at least 30 minutes daily  Come back for physical--you are past due for pap smear.  Keep track of your periods on a calendar.  You may likely have slightly irregular periods over the next few years.

## 2013-01-28 ENCOUNTER — Encounter: Payer: Self-pay | Admitting: Family Medicine

## 2013-01-28 DIAGNOSIS — K219 Gastro-esophageal reflux disease without esophagitis: Secondary | ICD-10-CM | POA: Insufficient documentation

## 2013-02-20 ENCOUNTER — Telehealth: Payer: Self-pay | Admitting: Family Medicine

## 2013-02-20 NOTE — Telephone Encounter (Signed)
Patient informed. 

## 2013-02-20 NOTE — Telephone Encounter (Signed)
Needs OV.  She can try an OTC yeast infection if she is having symptoms consistent with yeast (discharge and itching).  If that doesn' t work, schedule for [employer]

## 2013-03-04 ENCOUNTER — Encounter: Payer: Self-pay | Admitting: Family Medicine

## 2013-03-04 ENCOUNTER — Ambulatory Visit (INDEPENDENT_AMBULATORY_CARE_PROVIDER_SITE_OTHER): Payer: 59 | Admitting: Family Medicine

## 2013-03-04 DIAGNOSIS — M940 Chondrocostal junction syndrome [Tietze]: Secondary | ICD-10-CM

## 2013-03-04 DIAGNOSIS — R079 Chest pain, unspecified: Secondary | ICD-10-CM

## 2013-03-04 NOTE — Patient Instructions (Signed)
  Costochondritis Costochondritis (Tietze syndrome), or costochondral separation, is a swelling and irritation (inflammation) of the tissue (cartilage) that connects your ribs with your breastbone (sternum). It may occur on its own (spontaneously), through damage caused by an accident (trauma), or simply from coughing or minor exercise. It may take up to 6 weeks to get better and longer if you are unable to be conservative in your activities. HOME CARE INSTRUCTIONS   Avoid exhausting physical activity. Try not to strain your ribs during normal activity. This would include any activities using chest, belly (abdominal), and side muscles, especially if heavy weights are used.  Use ice for 15 to 20 minutes per hour while awake for the first 2 days. Place the ice in a plastic bag, and place a towel between the bag of ice and your skin.  Only take over-the-counter or prescription medicines for pain, discomfort, or fever as directed by your caregiver. SEEK IMMEDIATE MEDICAL CARE IF:   Your pain increases or you are very uncomfortable.  You have a fever.  You develop difficulty with your breathing.  You cough up blood.  You develop worse chest pains, shortness of breath, sweating, or vomiting.  You develop new, unexplained problems (symptoms). MAKE SURE YOU:   Understand these instructions.  Will watch your condition.  Will get help right away if you are not doing well or get worse. Document Released: 08/30/2005 Document Revised: 02/12/2012 Document Reviewed: 07/08/2008 [facility] Patient Information 2013 [place], [place].  You can take 4 Advil 3 times a day as needed for the discomfort

## 2013-03-04 NOTE — Progress Notes (Signed)
  Subjective:    Patient ID: Christine Shah, female    DOB: 1966-11-12, 47 y.o.   MRN: [id number]  HPI He has a six-day history of left-sided chest pain radiating into her back she describes as constant. Breathing makes no difference however motion increases the pain. No SOB weakness or diaphoresis.   Review of Systems     Objective:   Physical Exam Alert and in no distress. Cardiac exam shows regular rhythm without murmurs or gallops. Lungs are clear to auscultation. She is tender to palpation along the left costochondral junction. EKG shows no acute changes      Assessment & Plan:  Costochondritis, acute instructions given and AVS concerning the diagnosis. Also recommended 4 Advil 3 times per day.

## 2013-03-10 ENCOUNTER — Telehealth: Payer: Self-pay | Admitting: Family Medicine

## 2013-03-10 NOTE — Telephone Encounter (Signed)
Patient was seen by Dr. Susann Givens last week for chest pain, still having pain. States she took bo today at drug store and it was 152/76. Offered patient appointment to see Vincenza Hews today @ 4:00 pt declined, also offered appointment tomorrow morning with Dr. Susann Givens and patient also declined this appointment would like someone to call her

## 2013-03-10 NOTE — Telephone Encounter (Signed)
This blood pressure is not an urgent matter.  Previous BP's have been normal.  It doesn't appear that vitals are in system from recent visit with Dr. Susann Givens for chest pain (?).  Have pt keep track of BP's, and return with list if BP's remains >135-140/85-90 consistently

## 2013-03-11 NOTE — Telephone Encounter (Signed)
Called Christine Shah and she verbalized understanding

## 2013-03-19 ENCOUNTER — Telehealth: Payer: Self-pay | Admitting: *Deleted

## 2013-03-19 DIAGNOSIS — M940 Chondrocostal junction syndrome [Tietze]: Secondary | ICD-10-CM

## 2013-03-19 MED ORDER — MELOXICAM 15 MG PO TABS: 15.0000 mg | ORAL_TABLET | Freq: Every day | ORAL | Status: DC

## 2013-03-19 NOTE — Telephone Encounter (Signed)
Patient called and stated that her bp readings are now in a normal range and her pulse is good as well. She is in extreme pain with the costochondritis. She was told by Dr. Susann Givens to take ibuprofen, this in not helping. She had a very old meloxicam rx, there was 2 pills left. She took one and felt much better-can you call in an rx for this medication to [facility]? Please advise. Thanks.

## 2013-03-19 NOTE — Telephone Encounter (Signed)
Spoke with patient, it was 15mg . Sent to pharmacy for #30 0 refills.

## 2013-03-19 NOTE — Telephone Encounter (Signed)
Ok to rx meloxicam--find out the dose she was taking.  #30, no refill.  To take it just for 10-14 days at a time, prn for pain.  NOT to take any other NSAIDs at same time as using meloxicam

## 2013-04-03 ENCOUNTER — Ambulatory Visit (INDEPENDENT_AMBULATORY_CARE_PROVIDER_SITE_OTHER): Payer: 59 | Admitting: Family Medicine

## 2013-04-03 ENCOUNTER — Encounter: Payer: Self-pay | Admitting: Family Medicine

## 2013-04-03 VITALS — BP 118/80 | HR 68 | Ht 65.0 in | Wt 233.0 lb

## 2013-04-03 DIAGNOSIS — F17200 Nicotine dependence, unspecified, uncomplicated: Secondary | ICD-10-CM

## 2013-04-03 DIAGNOSIS — K219 Gastro-esophageal reflux disease without esophagitis: Secondary | ICD-10-CM

## 2013-04-03 DIAGNOSIS — R071 Chest pain on breathing: Secondary | ICD-10-CM

## 2013-04-03 DIAGNOSIS — R0789 Other chest pain: Secondary | ICD-10-CM

## 2013-04-03 MED ORDER — CELECOXIB 200 MG PO CAPS: 200.0000 mg | ORAL_CAPSULE | Freq: Two times a day (BID) | ORAL | Status: DC

## 2013-04-03 MED ORDER — KETOROLAC TROMETHAMINE 60 MG/2ML IM SOLN
60.0000 mg | Freq: Once | INTRAMUSCULAR | Status: AC
  Administered 2013-04-03: 60 mg via INTRAMUSCULAR
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
## 2013-04-03 NOTE — Patient Instructions (Addendum)
You were given a shot of Toradol today (anti-inflammatory), which should work for about 6 hours.   Start the Celebrex samples this evening.  Take once or twice daily with food.  I recommend taking it twice daily until your pain is better--you may cut down to once daily if it seems to be effective at that dose (that way samples will last longer, have some left over if needed for recurrence).  If this isn't helpful, the other options we reviewed were steroid course and referral to PT.  I'm fine with you seeing the DO for treatment, as scheduled.  Let us know what else we can do for you if this initial treatment isn't effective.

## 2013-04-09 ENCOUNTER — Other Ambulatory Visit: Payer: Self-pay | Admitting: *Deleted

## 2013-04-09 ENCOUNTER — Telehealth: Payer: Self-pay | Admitting: Family Medicine

## 2013-04-09 DIAGNOSIS — R5383 Other fatigue: Secondary | ICD-10-CM

## 2013-04-09 NOTE — Telephone Encounter (Signed)
Called patient and told her that she should probably be seen. She wanted me to send you a message letting you know how she feels to see if she truly needs and appointment as she was just here. She feels really tired, weak, has no energy and is dizzy. States she felt like this some time ago when her potassium was low. Kind of like the flu. She is having breathing problems. Last week the night that she was seen, she awoke at 1am and felt like her lungs were "full of water" she had taken the celebrex at 9pm and had a tordol shot while in the office. Wasn't sure if this was just a reaction. She never took the celebrex again, just that one time. She feels like she just catch her breath, kind of like how she feels when she has anxiety, but somewhat worse. Yesterday had the worst reflux she has ever had, almost vomited. She is having pain under the left breast area and every time she eats she feels like she is going to vomit. Would you like to see her?

## 2013-04-09 NOTE — Telephone Encounter (Signed)
Please call, she is c/o being weak, tired, no energy started yesterday. Patient wanted appointment today, no openings, offered appointment for Thursday but patient requested nurse call. Told patient again, not a lot we can do over the phone and asked if she would like to go ahead and schedule appointment for Thursday while there was still an opening, patient declined, stated she will wait for call back

## 2013-04-09 NOTE — Telephone Encounter (Signed)
Sounds like she will need some labs done.  She would benefit from a lung exam too, if having shortness of breath.  That is why visit is suggested.  If she refuses to come for a visit, then I'm happy to order labs to evaluate the symptoms she mentioned, but these are all new/different from what we discussed at visit, so visit is preferred.  If declines visit, then check CBC, c-met, TSH, vitamin D-OH (dx fatigue).

## 2013-04-09 NOTE — Telephone Encounter (Signed)
Spoke with patient and she really cannot afford to pay another copay this week. She is going to come in for labs in the morning. She did state that if you need to see her after the labs she will come in. Just an FYI.

## 2013-04-10 ENCOUNTER — Encounter: Payer: Self-pay | Admitting: Family Medicine

## 2013-04-10 ENCOUNTER — Other Ambulatory Visit: Payer: Self-pay

## 2013-04-10 ENCOUNTER — Ambulatory Visit (INDEPENDENT_AMBULATORY_CARE_PROVIDER_SITE_OTHER): Payer: 59 | Admitting: Family Medicine

## 2013-04-10 VITALS — BP 112/86 | HR 72 | Ht 65.0 in | Wt 228.0 lb

## 2013-04-10 DIAGNOSIS — R0989 Other specified symptoms and signs involving the circulatory and respiratory systems: Secondary | ICD-10-CM

## 2013-04-10 DIAGNOSIS — F411 Generalized anxiety disorder: Secondary | ICD-10-CM

## 2013-04-10 DIAGNOSIS — R071 Chest pain on breathing: Secondary | ICD-10-CM

## 2013-04-10 DIAGNOSIS — F17200 Nicotine dependence, unspecified, uncomplicated: Secondary | ICD-10-CM

## 2013-04-10 DIAGNOSIS — R5383 Other fatigue: Secondary | ICD-10-CM

## 2013-04-10 DIAGNOSIS — R0789 Other chest pain: Secondary | ICD-10-CM

## 2013-04-10 DIAGNOSIS — R5381 Other malaise: Secondary | ICD-10-CM

## 2013-04-10 DIAGNOSIS — K219 Gastro-esophageal reflux disease without esophagitis: Secondary | ICD-10-CM

## 2013-04-10 DIAGNOSIS — R0600 Dyspnea, unspecified: Secondary | ICD-10-CM

## 2013-04-10 LAB — COMPREHENSIVE METABOLIC PANEL
BUN: 10 mg/dL (ref 6–23)
CO2: 24 mEq/L (ref 19–32)
Creat: 0.79 mg/dL (ref 0.50–1.10)
Glucose, Bld: 84 mg/dL (ref 70–99)
Total Bilirubin: 0.6 mg/dL (ref 0.3–1.2)

## 2013-04-10 LAB — CBC WITH DIFFERENTIAL/PLATELET
Eosinophils Absolute: 0.1 10*3/uL (ref 0.0–0.7)
Eosinophils Relative: 1 % (ref 0–5)
Lymphs Abs: 1.9 10*3/uL (ref 0.7–4.0)
MCH: 27.6 pg (ref 26.0–34.0)
MCV: 82.5 fL (ref 78.0–100.0)
Monocytes Absolute: 0.5 10*3/uL (ref 0.1–1.0)
Monocytes Relative: 5 % (ref 3–12)
Platelets: 253 10*3/uL (ref 150–400)
RBC: 5.04 MIL/uL (ref 3.87–5.11)

## 2013-04-10 LAB — TSH: TSH: 0.687 u[IU]/mL (ref 0.350–4.500)

## 2013-04-10 MED ORDER — DEXLANSOPRAZOLE 60 MG PO CPDR: 60.0000 mg | DELAYED_RELEASE_CAPSULE | Freq: Every day | ORAL | Status: DC
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
## 2013-04-10 NOTE — Patient Instructions (Addendum)
You need to follow up with Dr. Kinnie Scales regarding your ongoing reflux and history of Barrett's esophagus.  Continue to take the Dexilant daily until you see him. Try and quit smoking. You can retry the celebrex if your chest pain (where it hurts to touch on the left side) gets worse--I recommend taking it just once daily, in the morning.  If you get any recurrent symptoms, side effects, then don't take any more of it

## 2013-04-11 ENCOUNTER — Encounter: Payer: Self-pay | Admitting: Family Medicine

## 2013-04-11 LAB — VITAMIN D 25 HYDROXY (VIT D DEFICIENCY, FRACTURES): Vit D, 25-Hydroxy: 31 ng/mL (ref 30–89)

## 2013-05-07 ENCOUNTER — Encounter: Payer: Self-pay | Admitting: Family Medicine

## 2013-05-07 ENCOUNTER — Ambulatory Visit (INDEPENDENT_AMBULATORY_CARE_PROVIDER_SITE_OTHER): Payer: 59 | Admitting: Family Medicine

## 2013-05-07 ENCOUNTER — Other Ambulatory Visit (HOSPITAL_COMMUNITY)
Admission: RE | Admit: 2013-05-07 | Discharge: 2013-05-07 | Disposition: A | Discharge status: Home / Self Care | Payer: 59 | Source: Ambulatory Visit | Attending: Family Medicine | Admitting: Family Medicine

## 2013-05-07 VITALS — BP 130/70 | HR 80 | Ht 65.0 in | Wt 226.0 lb

## 2013-05-07 DIAGNOSIS — R0789 Other chest pain: Secondary | ICD-10-CM

## 2013-05-07 DIAGNOSIS — K219 Gastro-esophageal reflux disease without esophagitis: Secondary | ICD-10-CM

## 2013-05-07 DIAGNOSIS — Z23 Encounter for immunization: Secondary | ICD-10-CM

## 2013-05-07 DIAGNOSIS — F17200 Nicotine dependence, unspecified, uncomplicated: Secondary | ICD-10-CM

## 2013-05-07 DIAGNOSIS — Z0000 Encounter for general adult medical examination without abnormal findings: Secondary | ICD-10-CM

## 2013-05-07 DIAGNOSIS — F411 Generalized anxiety disorder: Secondary | ICD-10-CM

## 2013-05-07 DIAGNOSIS — R52 Pain, unspecified: Secondary | ICD-10-CM

## 2013-05-07 DIAGNOSIS — Z01419 Encounter for gynecological examination (general) (routine) without abnormal findings: Secondary | ICD-10-CM | POA: Insufficient documentation

## 2013-05-07 DIAGNOSIS — R071 Chest pain on breathing: Secondary | ICD-10-CM

## 2013-05-07 LAB — POCT URINALYSIS DIPSTICK
Leukocytes, UA: NEGATIVE
Protein, UA: NEGATIVE
Urobilinogen, UA: NEGATIVE
pH, UA: 6

## 2013-05-07 MED ORDER — DULOXETINE HCL 30 MG PO CPEP: ORAL_CAPSULE | ORAL | Status: DC
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
## 2013-05-07 NOTE — Patient Instructions (Addendum)
HEALTH MAINTENANCE RECOMMENDATIONS:  It is recommended that you get at least 30 minutes of aerobic exercise at least 5 days/week (for weight loss, you may need as much as 60-90 minutes). This can be any activity that gets your heart rate up. This can be divided in 10-15 minute intervals if needed, but try and build up your endurance at least once a week.  Weight bearing exercise is also recommended twice weekly.  Eat a healthy diet with lots of vegetables, fruits and fiber.  "Colorful" foods have a lot of vitamins (ie green vegetables, tomatoes, red peppers, etc).  Limit sweet tea, regular sodas and alcoholic beverages, all of which has a lot of calories and sugar.  Up to 1 alcoholic drink daily may be beneficial for women (unless trying to lose weight, watch sugars).  Drink a lot of water.  Calcium recommendations are 1200-1500 mg daily (1500 mg for postmenopausal women or women without ovaries), and vitamin D 1000 IU daily.  This should be obtained from diet and/or supplements (vitamins), and calcium should not be taken all at once, but in divided doses.  Monthly self breast exams and yearly mammograms for women over the age of 62 is recommended.  Sunscreen of at least SPF 30 should be used on all sun-exposed parts of the skin when outside between the hours of 10 am and 4 pm (not just when at beach or pool, but even with exercise, golf, tennis, and yard work!)  Use a sunscreen that says "broad spectrum" so it covers both UVA and UVB rays, and make sure to reapply every 1-2 hours.  Remember to change the batteries in your smoke detectors when changing your clock times in the spring and fall.  Use your seat belt every time you are in a car, and please drive safely and not be distracted with cell phones and texting while driving.  Please try and quit smoking--start thinking about why/when you smoke (habit, boredom, stress) in order to come up with effective strategies to cut back or quit. Available  resources to help you quit include free counseling through [facility] ([facility]uit[facility] or 1-800-QUITNOW), smoking cessation classes through [facility] (call to find out schedule), over-the-counter nicotine replacements, and e-cigarettes (although this may not help break the hand-mouth habit).  Many insurance companies also have smoking cessation programs (which may decrease the cost of patches, meds if enrolled).  If these methods are not effective for you, and you are motivated to quit, return to discuss the possibility of prescription medications.  I recommend you take a supplement containing vitamin D, magnesium and calcium (either as a multi-vitamin, or a separate vitamin with these ingredients)  Schedule follow-up with Dr. Kinnie Scales.  Try meclizine for room spinning dizziness (vertigo)--this is available in multiple different type of OTC medications (ie Bonine, and others).

## 2013-05-12 ENCOUNTER — Encounter: Payer: Self-pay | Admitting: Family Medicine

## 2013-05-14 ENCOUNTER — Telehealth: Payer: Self-pay | Admitting: *Deleted

## 2013-05-14 DIAGNOSIS — R42 Dizziness and giddiness: Secondary | ICD-10-CM

## 2013-05-14 NOTE — Telephone Encounter (Signed)
Patient called and stated that she couldn't afford the Cymbalta. She did pick up her Lexapro and started it yesterday, only 1/2 a tablet though. She states that she is really having trouble functioning with the dizziness that she has been experiencing lately and the headaches as well. She is very fatigued, has NO energy at all. Please advise.

## 2013-05-15 MED ORDER — MECLIZINE HCL 25 MG PO TABS: 12.5000 mg | ORAL_TABLET | Freq: Three times a day (TID) | ORAL | Status: DC | PRN

## 2013-05-15 NOTE — Telephone Encounter (Signed)
I sent rx in for her.  It is OTC, which is why she hadn't been given rx--the information was written for her on her after-visit summary.  It is available both rx and OTC, so I guess get whichever is cheaper for her.

## 2013-05-15 NOTE — Telephone Encounter (Signed)
Ok to restart the lexapro.  Use the 1/2 tablet for the first week, then increase to full tablet.  Has she tried the meclizine that I recommended for her vertigo?  If lexapro makes her sleepy, then take it at bedtime.

## 2013-05-15 NOTE — Telephone Encounter (Signed)
PT INFORMED AND VERBALIZED UNDERSTANDING 

## 2013-05-15 NOTE — Telephone Encounter (Signed)
PT SAID THAT SHE HAS HAD NO RX FOR MECLIZINE  SHE WOULD LIKE TO TRY IT IF YOU WOULD PLEASE SEND IT IN AND SHE SAID OK ON LEXPRO PLEASE ADVISE

## 2013-06-15 ENCOUNTER — Encounter (HOSPITAL_COMMUNITY): Payer: Self-pay | Admitting: Family Medicine

## 2013-06-15 ENCOUNTER — Observation Stay (HOSPITAL_COMMUNITY)
Admission: EM | Admit: 2013-06-15 | Discharge: 2013-06-17 | Disposition: A | Discharge status: Home / Self Care | Payer: 59 | Attending: Internal Medicine | Admitting: Internal Medicine

## 2013-06-15 DIAGNOSIS — Z79899 Other long term (current) drug therapy: Secondary | ICD-10-CM | POA: Insufficient documentation

## 2013-06-15 DIAGNOSIS — R079 Chest pain, unspecified: Secondary | ICD-10-CM | POA: Diagnosis present

## 2013-06-15 DIAGNOSIS — R55 Syncope and collapse: Secondary | ICD-10-CM

## 2013-06-15 DIAGNOSIS — R1011 Right upper quadrant pain: Secondary | ICD-10-CM

## 2013-06-15 DIAGNOSIS — K219 Gastro-esophageal reflux disease without esophagitis: Secondary | ICD-10-CM | POA: Insufficient documentation

## 2013-06-15 DIAGNOSIS — R0789 Other chest pain: Secondary | ICD-10-CM

## 2013-06-15 DIAGNOSIS — F17200 Nicotine dependence, unspecified, uncomplicated: Secondary | ICD-10-CM

## 2013-06-15 DIAGNOSIS — R42 Dizziness and giddiness: Principal | ICD-10-CM | POA: Diagnosis present

## 2013-06-15 DIAGNOSIS — I498 Other specified cardiac arrhythmias: Secondary | ICD-10-CM | POA: Insufficient documentation

## 2013-06-15 DIAGNOSIS — E876 Hypokalemia: Secondary | ICD-10-CM | POA: Insufficient documentation

## 2013-06-15 DIAGNOSIS — F411 Generalized anxiety disorder: Secondary | ICD-10-CM | POA: Insufficient documentation

## 2013-06-15 DIAGNOSIS — H539 Unspecified visual disturbance: Secondary | ICD-10-CM

## 2013-06-15 MED ORDER — DIAZEPAM 5 MG PO TABS
5.0000 mg | ORAL_TABLET | Freq: Once | ORAL | Status: AC
  Administered 2013-06-16: 5 mg via ORAL
  Filled 2013-06-15: qty 1

## 2013-06-15 NOTE — ED Notes (Signed)
Patient states that she is having dizziness and sharp pains in her head. States that she was being treated for vertigo with Meclizine a couple of weeks ago. States that her symptoms are worse now.
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
## 2013-06-16 ENCOUNTER — Observation Stay (HOSPITAL_COMMUNITY): Payer: 59

## 2013-06-16 ENCOUNTER — Encounter (HOSPITAL_COMMUNITY): Payer: Self-pay | Admitting: Internal Medicine

## 2013-06-16 ENCOUNTER — Emergency Department (HOSPITAL_COMMUNITY): Payer: 59

## 2013-06-16 DIAGNOSIS — R42 Dizziness and giddiness: Secondary | ICD-10-CM

## 2013-06-16 DIAGNOSIS — R079 Chest pain, unspecified: Secondary | ICD-10-CM

## 2013-06-16 LAB — CBC WITH DIFFERENTIAL/PLATELET
Basophils Absolute: 0.1 10*3/uL (ref 0.0–0.1)
Basophils Relative: 1 % (ref 0–1)
Eosinophils Relative: 3 % (ref 0–5)
Eosinophils Relative: 4 % (ref 0–5)
HCT: 39.6 % (ref 36.0–46.0)
Lymphocytes Relative: 24 % (ref 12–46)
Lymphocytes Relative: 30 % (ref 12–46)
Lymphs Abs: 1.9 10*3/uL (ref 0.7–4.0)
MCHC: 33.7 g/dL (ref 30.0–36.0)
MCV: 84.1 fL (ref 78.0–100.0)
MCV: 84.3 fL (ref 78.0–100.0)
Monocytes Absolute: 0.5 10*3/uL (ref 0.1–1.0)
Monocytes Relative: 8 % (ref 3–12)
Neutro Abs: 5.2 10*3/uL (ref 1.7–7.7)
Platelets: 217 10*3/uL (ref 150–400)
RBC: 4.71 MIL/uL (ref 3.87–5.11)
RDW: 14.3 % (ref 11.5–15.5)
RDW: 14.4 % (ref 11.5–15.5)
WBC: 6.2 10*3/uL (ref 4.0–10.5)
WBC: 7.9 10*3/uL (ref 4.0–10.5)

## 2013-06-16 LAB — COMPREHENSIVE METABOLIC PANEL
Albumin: 3.5 g/dL (ref 3.5–5.2)
Alkaline Phosphatase: 61 U/L (ref 39–117)
BUN: 9 mg/dL (ref 6–23)
Potassium: 3.3 mEq/L — ABNORMAL LOW (ref 3.5–5.1)
Total Protein: 6.4 g/dL (ref 6.0–8.3)

## 2013-06-16 LAB — RAPID URINE DRUG SCREEN, HOSP PERFORMED
Benzodiazepines: POSITIVE — AB
Cocaine: NOT DETECTED
Opiates: NOT DETECTED
Tetrahydrocannabinol: NOT DETECTED

## 2013-06-16 LAB — POCT I-STAT TROPONIN I: Troponin i, poc: 0 ng/mL (ref 0.00–0.08)

## 2013-06-16 LAB — BASIC METABOLIC PANEL
CO2: 24 mEq/L (ref 19–32)
Calcium: 9 mg/dL (ref 8.4–10.5)
Creatinine, Ser: 0.76 mg/dL (ref 0.50–1.10)
GFR calc Af Amer: >90 mL/min (ref >90)
Sodium: 140 mEq/L (ref 135–145)

## 2013-06-16 LAB — TROPONIN I
Troponin I: <0.3 ng/mL (ref <0.30)
Troponin I: <0.3 ng/mL (ref <0.30)

## 2013-06-16 MED ORDER — ENOXAPARIN SODIUM 40 MG/0.4ML ~~LOC~~ SOLN
40.0000 mg | SUBCUTANEOUS | Status: DC
  Administered 2013-06-16: 40 mg via SUBCUTANEOUS
  Filled 2013-06-16 (×2): qty 0.4

## 2013-06-16 MED ORDER — POTASSIUM CHLORIDE CRYS ER 20 MEQ PO TBCR
40.0000 meq | EXTENDED_RELEASE_TABLET | Freq: Once | ORAL | Status: AC
  Administered 2013-06-16: 40 meq via ORAL
  Filled 2013-06-16: qty 2

## 2013-06-16 MED ORDER — ALPRAZOLAM 0.5 MG PO TABS: 0.5000 mg | ORAL_TABLET | Freq: Every evening | ORAL | Status: DC | PRN

## 2013-06-16 MED ORDER — ONDANSETRON HCL 4 MG/2ML IJ SOLN: 4.0000 mg | Freq: Three times a day (TID) | INTRAMUSCULAR | Status: DC | PRN

## 2013-06-16 MED ORDER — ACETAMINOPHEN 325 MG PO TABS: 650.0000 mg | ORAL_TABLET | Freq: Four times a day (QID) | ORAL | Status: DC | PRN

## 2013-06-16 MED ORDER — ADULT MULTIVITAMIN W/MINERALS CH
1.0000 | ORAL_TABLET | Freq: Every day | ORAL | Status: DC
  Administered 2013-06-17: 1 via ORAL
  Filled 2013-06-16: qty 1

## 2013-06-16 MED ORDER — ONDANSETRON HCL 4 MG PO TABS: 4.0000 mg | ORAL_TABLET | Freq: Four times a day (QID) | ORAL | Status: DC | PRN

## 2013-06-16 MED ORDER — GADOBENATE DIMEGLUMINE 529 MG/ML IV SOLN
20.0000 mL | Freq: Once | INTRAVENOUS | Status: AC | PRN
  Administered 2013-06-16: 20 mL via INTRAVENOUS

## 2013-06-16 MED ORDER — ASPIRIN EC 325 MG PO TBEC
325.0000 mg | DELAYED_RELEASE_TABLET | Freq: Every day | ORAL | Status: DC
  Administered 2013-06-16 – 2013-06-17 (×2): 325 mg via ORAL
  Filled 2013-06-16 (×2): qty 1

## 2013-06-16 MED ORDER — ONDANSETRON HCL 4 MG/2ML IJ SOLN: 4.0000 mg | Freq: Four times a day (QID) | INTRAMUSCULAR | Status: DC | PRN

## 2013-06-16 MED ORDER — SODIUM CHLORIDE 0.9 % IV SOLN
INTRAVENOUS | Status: DC
  Administered 2013-06-16: 75 mL/h via INTRAVENOUS

## 2013-06-16 MED ORDER — LORAZEPAM 2 MG/ML IJ SOLN
1.0000 mg | Freq: Once | INTRAMUSCULAR | Status: AC
  Administered 2013-06-16: 1 mg via INTRAVENOUS
  Filled 2013-06-16: qty 1

## 2013-06-16 MED ORDER — SODIUM CHLORIDE 0.9 % IJ SOLN
3.0000 mL | Freq: Two times a day (BID) | INTRAMUSCULAR | Status: DC
  Administered 2013-06-16 – 2013-06-17 (×2): 3 mL via INTRAVENOUS

## 2013-06-16 MED ORDER — ACETAMINOPHEN 650 MG RE SUPP: 650.0000 mg | Freq: Four times a day (QID) | RECTAL | Status: DC | PRN

## 2013-06-16 MED ORDER — ENSURE COMPLETE PO LIQD: 237.0000 mL | Freq: Every day | ORAL | Status: DC | PRN

## 2013-06-16 MED ORDER — MECLIZINE HCL 12.5 MG PO TABS
12.5000 mg | ORAL_TABLET | Freq: Three times a day (TID) | ORAL | Status: DC | PRN
  Filled 2013-06-16: qty 2

## 2013-06-16 MED ORDER — FAMOTIDINE 20 MG PO TABS
20.0000 mg | ORAL_TABLET | Freq: Every day | ORAL | Status: DC
  Administered 2013-06-16 – 2013-06-17 (×2): 20 mg via ORAL
  Filled 2013-06-16 (×2): qty 1
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
## 2013-06-16 NOTE — Progress Notes (Addendum)
[facility] PROGRESS NOTE  Christine Shah WUJ:[id number] DOB: [birth year] DOA: 06/15/2013 PCP: Lavonda Jumbo, MD  Brief narrative 47 year old female patient with history of anxiety, GERD, Barrett's esophagus, costochondritis presented to the [facility] ED on 06/16/13 with complaints of worsening dizziness. Patient states that she has had dizziness for last 4 months which started off as a sharp shooting pain from her epigastrium to left side of the neck. Since then she complains of almost daily dizziness with associated vertigo (she spinning around the place), bilateral tinnitus, unsteadiness at times and feeling like she's going to pass out but no syncopal episodes or falls. She complains of dizziness both while lying down and worse in upright position. She was seen by her PCP 5 weeks ago and told to have "vertigo" and given a trial of meclizine which did not help. Couple days back, her dizziness seems to have worsened and she presented to the ED. She also complains of lower substernal chest pain-reproducible to touch and has been told to have costochondritis. In the ED, CT head was negative. Hospitalist admission was requested for further management  Assessment/Plan: 1. Dizziness/vertigo: Unclear etiology. Long-standing symptom for at least 4 months. CT head negative for acute findings. No focal findings on exam and no nystagmus. Discussed with neurologist on call, Dr. Amada Jupiter who recommended getting MRA brain without contrast and MRA neck with contrast in addition to MRI brain to assess posterior circulation. If these are negative, he recommended OP ENT consultation for further evaluation i.e. ENG etc. If ENT workup is negative then may consider OP neurology consultation. We'll get OT to evaluate.  2. Atypical chest pain: Likely musculoskeletal from costochondritis. Pain medications. D-dimer negative. Cardiac enzymes negative. 3. Hypokalemia: Replete 4. Tobacco abuse: Cessation  counseled. 5. GERD: Pepcid  Code Status: Full Family Communication:  None Disposition Plan:  home when medically stable   Consultants:  None  Procedures:  None  Antibiotics:  None   HPI/Subjective:  persisting dizziness. No nausea or vomiting.  Objective: Filed Vitals:   06/16/13 0548 06/16/13 1426 06/16/13 1430 06/16/13 1433  BP: 112/67 118/81 114/63 122/72  Pulse: 68 56 62 54  Temp:  98 F (36.7 C)    TempSrc:  Oral    Resp:  18    Height:      Weight:      SpO2: 98% 99% 98% 99%    Intake/Output Summary (Last 24 hours) at 06/16/13 1536 Last data filed at 06/16/13 1407  Gross per 24 hour  Intake    720 ml  Output   1200 ml  Net   -480 ml   Filed Weights   06/15/13 2258 06/16/13 0542  Weight: 99.791 kg (220 lb) 99.3 kg (218 lb 14.7 oz)    Exam:   General exam: Comfortable.  Respiratory system: Clear. No increased work of breathing. Reproducible lower costochondral junction tenderness.   Cardiovascular system: S1 & S2 heard, RRR. No JVD, murmurs, gallops, clicks or pedal edema. telemetry: Sinus bradycardia mostly in the 50s. At times in the mid 40s.   Gastrointestinal system: Abdomen is nondistended, soft. Mild epigastric tenderness but without rigidity, guarding or rebound.  Normal bowel sounds heard.  Central nervous system: Alert and oriented. No focal neurological deficits.  Extremities: Symmetric 5 x 5 power.   Data Reviewed: Basic Metabolic Panel:  Recent Labs Lab 06/16/13 0027 06/16/13 0635  NA 140 140  K 3.4* 3.3*  CL 105 106  CO2 24 24  GLUCOSE 112* 112*  BUN 10 9  CREATININE 0.76 0.71  CALCIUM 9.0 8.7   Liver Function Tests:  Recent Labs Lab 06/16/13 0635  AST 11  ALT 9  ALKPHOS 61  BILITOT 0.5  PROT 6.4  ALBUMIN 3.5   No results found for this basename: LIPASE, AMYLASE,  in the last 168 hours No results found for this basename: AMMONIA,  in the last 168 hours CBC:  Recent Labs Lab 06/16/13 0027 06/16/13 0635   WBC 7.9 6.2  NEUTROABS 5.2 3.6  HGB 13.7 13.5  HCT 40.7 39.6  MCV 84.3 84.1  PLT 217 194   Cardiac Enzymes:  Recent Labs Lab 06/16/13 0635 06/16/13 1125  TROPONINI <0.30 <0.30   BNP (last 3 results) No results found for this basename: PROBNP,  in the last 8760 hours CBG: No results found for this basename: GLUCAP,  in the last 168 hours  No results found for this or any previous visit (from the past 240 hour(s)).   Studies: Dg Chest 2 View  06/16/2013   *RADIOLOGY REPORT*  Clinical Data: Chest tightness.  CHEST - 2 VIEW  Comparison: Chest radiograph performed 11/05/2011  Findings: The lungs are well-aerated and clear.  There is no evidence of focal opacification, pleural effusion or pneumothorax.  The heart is normal in size; the mediastinal contour is within normal limits.  No acute osseous abnormalities are seen.  IMPRESSION: No acute cardiopulmonary process seen.   Original Report Authenticated By: Tonia Ghent, M.D.   Ct Head Wo Contrast  06/16/2013   *RADIOLOGY REPORT*  Clinical Data: Dizziness with sharp pains in head.  Treated recently for vertigo.  CT HEAD WITHOUT CONTRAST  Technique:  Contiguous axial images were obtained from the base of the skull through the vertex without contrast.  Comparison: 08/10/2011.  Findings:  Calvarium: No acute osseous abnormality.  No lytic or blastic lesion.  Orbits: No acute abnormality.  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  Calcification of the supraclinoid carotid arteries bilaterally, most likely early atherosclerosis.  IMPRESSION: No evidence of acute intracranial disease.   Original Report Authenticated By: Tiburcio Pea     Additional labs:   Scheduled Meds: . aspirin EC  325 mg Oral Daily  . enoxaparin (LOVENOX) injection  40 mg Subcutaneous Q24H  .  LORazepam  1 mg Intravenous Once  . sodium chloride  3 mL Intravenous Q12H   Continuous Infusions:    Principal Problem:   Dizziness Active Problems:   Chest pain    Time spent:  35 minutes    [facility]  [facility] Pager [phone].   If 8PM-8AM, please contact night-coverage at [email or web address], password [facility] 06/16/2013, 3:36 PM  LOS: 1 day

## 2013-06-17 DIAGNOSIS — K219 Gastro-esophageal reflux disease without esophagitis: Secondary | ICD-10-CM

## 2013-06-17 DIAGNOSIS — F17200 Nicotine dependence, unspecified, uncomplicated: Secondary | ICD-10-CM

## 2013-06-17 DIAGNOSIS — R071 Chest pain on breathing: Secondary | ICD-10-CM

## 2013-06-17 MED ORDER — FAMOTIDINE 20 MG PO TABS: 20.0000 mg | ORAL_TABLET | Freq: Every day | ORAL | Status: DC

## 2013-06-17 NOTE — Evaluation (Signed)
Occupational Therapy Evaluation Patient Details Name: Christine Shah MRN: [id number] DOB: 1966-03-10 Today's Date: 06/17/2013 Time: 4098-1191 OT Time Calculation (min): 21 min  OT Assessment / Plan / Recommendation History of present illness  Pt has had a 4 mo h/o dizziness, which has gotten worse   Clinical Impression   This 47 year old female was admitted with dizziness (feels like the environment tips back and forth.  She reports sometime spinning and sometimes like she will pass out).  During eval, pt was independent ambulating:  Recommended supervision with tub transfer for safety.  Pt did not have any unsteadiness and doesn't feel she wants a seat for tub.  Cautioned to be very careful in the tub (and told about clamp on grab bar, if needed).  Pt states that nothing really helps dizziness--has tried focusing on something that is not moving.  Do not feel that I can do anything more to assist her at this time.  If spinning gets worse, she could pursue vestibular rehab at OP PT.      OT Assessment       Follow Up Recommendations   (if spinning gets worse, could pursue OP PT vesibular program)    Barriers to Discharge      Equipment Recommendations   (pt does not want a seat for shower.  She feels steady/leans)    Recommendations for Other Services    Frequency       Precautions / Restrictions Precautions Precautions: Fall Restrictions Weight Bearing Restrictions: No   Pertinent Vitals/Pain Orthostatics were taken by nursing this am.  No pain reported---sometimes head and neck hurt    ADL  Transfers/Ambulation Related to ADLs: pt ambulated to bathroom independently.  Supervision for safety for stepping over tub--simulated ADL Comments: Pt is independent with basic adls.  She reports that sometimes she feels off balance but did not lose balance during OT eval.  Pt reports she has had dizziness for 4 months but it has been getting worse:  she has glasses since Feb or March but  hasn't been wearing them as it makes dizziness worse.  She describes dizziness as a wavy feeling and sometimes spinning.  Nothing makes it better.  She has tried fixing gaze.  Tracking and saccades were wfls as were head shaking, VOR, HT tests and gaze holding.  Pt's L eyelid is a little more closed and she reports that this is normal:  she has a lazy eye.      OT Diagnosis:    OT Problem List:   OT Treatment Interventions:     OT Goals(Current goals can be found in the care plan section)    Visit Information  Last OT Received On: 06/17/13 Assistance Needed: +1       Prior Functioning     Home Living Family/patient expects to be discharged to:: Private residence Living Arrangements: Children Additional Comments: has 51 year old son at home.  Works in a [employer] Level of Independence: Independent Communication Communication: No difficulties         Vision/Perception Vision - Assessment Additional Comments: L eyelid more closed.  Pt reports a lazy eye   Cognition  Cognition Arousal/Alertness: Awake/alert Behavior During Therapy: WFL for tasks assessed/performed Overall Cognitive Status: Within Functional Limits for tasks assessed    Extremity/Trunk Assessment Upper Extremity Assessment Upper Extremity Assessment: Overall WFL for tasks assessed     Mobility Transfers Transfers: Sit to Stand;Stand to Sit Sit to Stand: 7: Independent Stand to Sit:  7: Independent     Exercise     Balance     End of Session OT - End of Session Activity Tolerance: Patient tolerated treatment well Patient left: in bed;with call bell/phone within reach  GO Functional Assessment Tool Used: clinical observation/judgment Functional Limitation: Self care Self Care Current Status (Z6109): At least 1 percent but less than 20 percent impaired, limited or restricted Self Care Goal Status (U0454): At least 1 percent but less than 20 percent impaired, limited or  restricted Self Care Discharge Status [phone]): At least 1 percent but less than 20 percent impaired, limited or restricted   [facility] 06/17/2013, 8:49 AM Marica Otter, OTR/L [phone] 06/17/2013

## 2013-06-17 NOTE — Discharge Summary (Signed)
Physician Discharge Summary  Christine Shah ZOX:[id number] DOB: 05-Mar-1966 DOA: 06/15/2013  PCP: Lavonda Jumbo, MD  Admit date: 06/15/2013 Discharge date: 06/17/2013  Time spent: Greater than 30 minutes  Recommendations for Outpatient Follow-up:  1. Dr. Joselyn Arrow, PCP on 06/19/2013 at 3:45 PM.  2. Dr. Delia Heady, Neurologist on 07/08/2013 at 1 PM. 3. Please arrange OP ENT consultation through PCP's office. 4. Advised not to drive until cleared by PCP.   Discharge Diagnoses:  Principal Problem:   Dizziness Active Problems:   Chest pain   Discharge Condition: Improved & Stable  Diet recommendation: Heart Healthy diet.  Filed Weights   06/15/13 2258 06/16/13 0542  Weight: 99.791 kg (220 lb) 99.3 kg (218 lb 14.7 oz)    History of present illness:  47 year old female patient with history of anxiety, GERD, Barrett's esophagus, costochondritis presented to the [facility] ED on 06/16/13 with complaints of worsening dizziness. Patient states that she has had dizziness for last 4 months which started off as a sharp shooting pain from her epigastrium to left side of the neck. Since then she complains of almost daily dizziness with associated vertigo (she spinning around the place), bilateral tinnitus, unsteadiness at times and feeling like she's going to pass out but no syncopal episodes or falls. She complains of dizziness both while lying down and worse in upright position. She was seen by her PCP 5 weeks ago and told to have "vertigo" and given a trial of meclizine which did not help. Couple days back, her dizziness seems to have worsened and she presented to the ED. She also complains of lower substernal chest pain-reproducible to touch and has been told to have costochondritis. In the ED, CT head was negative. Hospitalist admission was requested for further management  Hospital Course:  1. Dizziness/vertigo: Unclear etiology. Long-standing symptoms for last 4 months. Rule out ENT  pathology. Imaging studies (CT head, MRI/MRA brain, MRA neck) without significant findings. No focal findings on exam and no nystagmus. Discussed with neurologist on call on 06/16/13 who recommended that if above imaging studies were negative, then get outpatient ENT consultation for further workup and if that was negative then a neurology consultation. OT has evaluated patient and have no specific recommendations. Since patient gave history of almost passing out while she was driving during an episode of dizziness, she has been advised not to drive until cleared by her PCP and she verbalizes understanding. Orthostatic blood pressures negative. Patient declines a pregnancy test- states that she had tubal ligation in 2012 and in not sexually active. 2. Atypical chest pain: Likely musculoskeletal from costal chondritis versus GERD. Reproducible to palpation. When necessary Tylenol. Pepcid added. Cardiac enzymes and d-dimer negative. 3. Hypokalemia: Repleted orally. 4. Tobacco abuse: Smokes 1-1/2 packs of cigarettes per day. Strongly counseled regarding cessation. She verbalized understanding. 5. GERD: Pepcid started. 6. Sinus bradycardia: TSH on 04/10/13 was 0.687. Patient not on any negative chronotropic. Possibly physiological. Do not believe that this is the cause of her dizziness. 7. Anxiety: Stable.  Procedures:  None   Consultations:  None  Discharge Exam:  Complaints: Still has dizziness. Denies any other complaints.  Filed Vitals:   06/17/13 0535 06/17/13 0900 06/17/13 0903 06/17/13 0905  BP: 100/60 99/73 106/65 111/66  Pulse: 66 54    Temp:      TempSrc:      Resp:      Height:      Weight:      SpO2:  99% 99%  99%     General exam: Comfortable.   Respiratory system: Clear. No increased work of breathing. Reproducible lower costochondral junction tenderness.   Cardiovascular system: S1 & S2 heard, RRR. No JVD, murmurs, gallops, clicks or pedal edema. telemetry: Sinus  bradycardia mostly in the 50s. At times in the mid 40s.   Gastrointestinal system: Abdomen is nondistended, soft. Mild epigastric tenderness but without rigidity, guarding or rebound. Normal bowel sounds heard.   Central nervous system: Alert and oriented. No focal neurological deficits.   Extremities: Symmetric 5 x 5 power.   Discharge Instructions      Discharge Orders   Future Appointments Provider Department Dept Phone   06/19/2013 3:45 PM Joselyn Arrow, MD [facility] [phone]   07/08/2013 1:30 PM Micki Riley, MD [facility] [phone]   Future Orders Complete By Expires     Call MD for:  persistant dizziness or light-headedness  As directed     Call MD for:  severe uncontrolled pain  As directed     Diet - low sodium heart healthy  As directed     Driving Restrictions  As directed     Comments:      Please do not drive until cleared by your Primary MD.    Increase activity slowly  As directed         Medication List         ALPRAZolam 0.5 MG tablet  Commonly known as:  XANAX  Take 0.5 mg by mouth at bedtime as needed for sleep.     famotidine 20 MG tablet  Commonly known as:  PEPCID  Take 1 tablet (20 mg total) by mouth daily.     meclizine 25 MG tablet  Commonly known as:  ANTIVERT  Take 0.5-1 tablets (12.5-25 mg total) by mouth 3 (three) times daily as needed for dizziness.       Follow-up Information   Follow up with [facility] On 07/08/2013. (Appointment with Dr. Darl Householder  at 1:00 PM )    Contact information:   [address] [place] [address] [phone]      Follow up with Joselyn Arrow A, MD On 06/19/2013. (At 3:45 PM. Please arrange out patient ENT consultation.)    Contact information:   [address] [place] [address] [phone]        The results of significant diagnostics from this hospitalization (including imaging, microbiology, ancillary and laboratory)  are listed below for reference.    Significant Diagnostic Studies: Dg Chest 2 View  06/16/2013   *RADIOLOGY REPORT*  Clinical Data: Chest tightness.  CHEST - 2 VIEW  Comparison: Chest radiograph performed 11/05/2011  Findings: The lungs are well-aerated and clear.  There is no evidence of focal opacification, pleural effusion or pneumothorax.  The heart is normal in size; the mediastinal contour is within normal limits.  No acute osseous abnormalities are seen.  IMPRESSION: No acute cardiopulmonary process seen.   Original Report Authenticated By: Tonia Ghent, M.D.   Ct Head Wo Contrast  06/16/2013   *RADIOLOGY REPORT*  Clinical Data: Dizziness with sharp pains in head.  Treated recently for vertigo.  CT HEAD WITHOUT CONTRAST  Technique:  Contiguous axial images were obtained from the base of the skull through the vertex without contrast.  Comparison: 08/10/2011.  Findings:  Calvarium: No acute osseous abnormality.  No lytic or blastic lesion.  Orbits: No acute abnormality.  Brain: No evidence of acute abnormality, such as acute infarction,  hemorrhage, hydrocephalus, or mass lesion/mass effect.There is stable cystic changes involving the pineal gland, measuring 9 mm in maximal dimension.  Calcification of the supraclinoid carotid arteries bilaterally, most likely early atherosclerosis.  IMPRESSION: No evidence of acute intracranial disease.   Original Report Authenticated By: Tiburcio Pea   [facility] Wo Contrast  06/16/2013   *RADIOLOGY REPORT*  Clinical Data:  Dizziness and weakness.  Headache.  MRI HEAD WITHOUT AND WITH CONTRAST MRA HEAD WITHOUT CONTRAST MRA NECK WITHOUT AND WITH CONTRAST  Technique:  Multiplanar, multiecho pulse sequences of the brain and surrounding structures were obtained without and with intravenous contrast.  Angiographic images of the Circle of Willis were obtained using MRA technique without intravenous contrast. Angiographic images of the neck were obtained using MRA  technique without and with intravenous contrast.  Carotid stenosis measurements (when applicable) are obtained utilizing NASCET criteria, using the distal internal carotid diameter as the denominator.  Contrast: 20mL MULTIHANCE GADOBENATE DIMEGLUMINE 529 MG/ML IV SOLN  Comparison:  Head CT same day  MRI HEAD  Findings:  Diffusion imaging does not show any acute or subacute infarction.  The brainstem and cerebellum are normal.  The cerebral hemispheres show a few punctate foci of T2 and FLAIR signal in the frontal white matter.  No cortical or large vessel territory insult.  No mass lesion, hemorrhage, hydrocephalus or extra-axial collection.  No pituitary mass.  Sinuses are clear.  There is a small amount of fluid in the mastoid air cells on the right.  IMPRESSION: No acute or significant finding relative to the brain.  Few punctate white matter foci not likely to be of clinical significance.  MRA HEAD  Findings: Both internal carotid arteries are widely patent into the brain.  The anterior and middle cerebral vessels are patent without proximal stenosis, aneurysm or vascular malformation.  Both vertebral arteries are patent to the basilar.  No basilar stenosis. Posterior circulation branch vessels are patent.  There are patent posterior communicating arteries bilaterally.  IMPRESSION: Normal intracranial MR angiography of the large and medium-sized vessels.  MRA NECK  Findings: Branching pattern of the brachiocephalic vessels from the arch is normal.  No origin stenoses.  There is considerable motion degradation of the images above that.  Both common carotid arteries are patent to the bifurcation.  I do not suspect the presence of many carotid bifurcation disease, but detail is limited.  There is flow of the vertebral arteries, but detail is not present beyond that.  IMPRESSION: Markedly motion degraded exam.  No evidence of major vessel occlusion.  No carotid bifurcation disease suspected, though resolution is  limited.   Original Report Authenticated By: Paulina Fusi, M.D.    Microbiology: No results found for this or any previous visit (from the past 240 hour(s)).   Labs: Basic Metabolic Panel:  Recent Labs Lab 06/16/13 0027 06/16/13 0635  NA 140 140  K 3.4* 3.3*  CL 105 106  CO2 24 24  GLUCOSE 112* 112*  BUN 10 9  CREATININE 0.76 0.71  CALCIUM 9.0 8.7   Liver Function Tests:  Recent Labs Lab 06/16/13 0635  AST 11  ALT 9  ALKPHOS 61  BILITOT 0.5  PROT 6.4  ALBUMIN 3.5   No results found for this basename: LIPASE, AMYLASE,  in the last 168 hours No results found for this basename: AMMONIA,  in the last 168 hours CBC:  Recent Labs Lab 06/16/13 0027 06/16/13 0635  WBC 7.9 6.2  NEUTROABS 5.2 3.6  HGB 13.7  13.5  HCT 40.7 39.6  MCV 84.3 84.1  PLT 217 194   Cardiac Enzymes:  Recent Labs Lab 06/16/13 0635 06/16/13 1125 06/16/13 2031  TROPONINI <0.30 <0.30 <0.30   BNP: BNP (last 3 results) No results found for this basename: PROBNP,  in the last 8760 hours CBG: No results found for this basename: GLUCAP,  in the last 168 hours  Additional labs:  D-dimer: 0.46  UDS: Positive for benzodiazepines.    Signed:  Venancio Chenier  [facility] 06/17/2013, 1:03 PM

## 2013-06-19 ENCOUNTER — Ambulatory Visit (INDEPENDENT_AMBULATORY_CARE_PROVIDER_SITE_OTHER): Payer: 59 | Admitting: Family Medicine

## 2013-06-19 ENCOUNTER — Encounter: Payer: Self-pay | Admitting: Family Medicine

## 2013-06-19 VITALS — BP 108/68 | HR 76 | Ht 65.0 in | Wt 218.0 lb

## 2013-06-19 DIAGNOSIS — K219 Gastro-esophageal reflux disease without esophagitis: Secondary | ICD-10-CM

## 2013-06-19 DIAGNOSIS — F17200 Nicotine dependence, unspecified, uncomplicated: Secondary | ICD-10-CM

## 2013-06-19 DIAGNOSIS — R42 Dizziness and giddiness: Secondary | ICD-10-CM

## 2013-06-19 DIAGNOSIS — F411 Generalized anxiety disorder: Secondary | ICD-10-CM

## 2013-06-19 MED ORDER — DIAZEPAM 5 MG PO TABS: 5.0000 mg | ORAL_TABLET | Freq: Three times a day (TID) | ORAL | Status: DC | PRN

## 2013-06-19 NOTE — Patient Instructions (Addendum)
We are referring you to ENT.  Keep neuro appointment as scheduled.  If no answers received, or unhappy we can send elsewhere for second opinion. Try using the diazepam as needed for vertigo.  It will make you sleepy, so no driving (no driving anyway while dizzy), and do not take alprazolam with the diazepam.  I recommend following up with eye doctor at some point.  Question whether symptoms could be some sort of migraine variant if no other cause is found.  Quit smoking.
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
## 2013-06-20 ENCOUNTER — Encounter: Payer: Self-pay | Admitting: Family Medicine

## 2013-06-26 ENCOUNTER — Telehealth: Payer: Self-pay | Admitting: Family Medicine

## 2013-06-26 NOTE — Telephone Encounter (Signed)
Pt called in today and wanted ov to get her driving privileges back after Aug 8th.  I told her you were out of office the following week.  She said of course and she would call back.   In reading your notes, it appears you told her no driving while dizzy and while taking the meds.  She thinks you revoked her driving privileges, please advise.

## 2013-06-26 NOTE — Telephone Encounter (Signed)
Patient informed. 

## 2013-06-26 NOTE — Telephone Encounter (Signed)
Her driving privileges weren't revoked by me.  She is having significant dizziness and didn't feel safe to drive (herself, and that was the hospitalists recommendations, since she didn't feel comfortable driving).  If she is having dizziness where it isn't safe for her to drive (she closes her eyes when the dizziness comes on), then she should remain not driving.  I didn't revoke anything or contact DMV.  I want her to be safe.  If her dizziness is resolving/improving and she feels safe to drive, you can resume driving.  She can discuss with her neurologist at upcoming appointment

## 2013-07-08 ENCOUNTER — Ambulatory Visit (INDEPENDENT_AMBULATORY_CARE_PROVIDER_SITE_OTHER): Payer: 59 | Admitting: Neurology

## 2013-07-08 ENCOUNTER — Encounter: Payer: Self-pay | Admitting: Neurology

## 2013-07-08 VITALS — BP 105/72 | HR 87 | Ht 67.0 in | Wt 220.0 lb

## 2013-07-08 DIAGNOSIS — R42 Dizziness and giddiness: Secondary | ICD-10-CM

## 2013-07-08 DIAGNOSIS — H538 Other visual disturbances: Secondary | ICD-10-CM

## 2013-07-08 DIAGNOSIS — R269 Unspecified abnormalities of gait and mobility: Secondary | ICD-10-CM
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
## 2013-07-08 NOTE — Patient Instructions (Addendum)
Check MRI scan of the cervical and thoracic spine for compression as well as brainstem and visual evoked responses for demyelinating disease and lab work for ANA, ESR, Lyme and angiotensin-converting enzyme. No specific medications are indicated at the current time. I have advised her to call me in case she has worsening of her symptoms or new symptoms. Return for followup in 6 weeks.

## 2013-07-09 ENCOUNTER — Encounter (INDEPENDENT_AMBULATORY_CARE_PROVIDER_SITE_OTHER): Payer: 59

## 2013-07-09 DIAGNOSIS — R42 Dizziness and giddiness: Secondary | ICD-10-CM

## 2013-07-09 DIAGNOSIS — R269 Unspecified abnormalities of gait and mobility: Secondary | ICD-10-CM

## 2013-07-09 DIAGNOSIS — H538 Other visual disturbances: Secondary | ICD-10-CM

## 2013-07-09 DIAGNOSIS — Z0289 Encounter for other administrative examinations: Secondary | ICD-10-CM

## 2013-07-09 DIAGNOSIS — H5310 Unspecified subjective visual disturbances: Secondary | ICD-10-CM

## 2013-07-09 LAB — SEDIMENTATION RATE: Sed Rate: 5 mm/hr (ref 0–32)

## 2013-07-09 LAB — TSH: TSH: 0.724 u[IU]/mL (ref 0.450–4.500)

## 2013-07-09 LAB — LYME, TOTAL AB TEST/REFLEX: Lyme Ab: <0.91 index (ref 0.00–0.90)

## 2013-07-14 ENCOUNTER — Telehealth: Payer: Self-pay | Admitting: Neurology

## 2013-07-14 NOTE — Telephone Encounter (Signed)
LMVM for pt re: VER, BAER normal.  Labs mostly normal.  K 3.3 (refer to pcp, eat banana/ oj ).  She is to call back as needed.

## 2013-07-18 ENCOUNTER — Telehealth: Payer: Self-pay | Admitting: Neurology

## 2013-07-22 NOTE — Telephone Encounter (Signed)
Prescribe 1 Xanax Pack for MRI on Wed. Aug 20th for anxiety.

## 2013-07-22 NOTE — Telephone Encounter (Signed)
I have called the patient to let her know her medication will be at the front desk.

## 2013-07-28 ENCOUNTER — Telehealth: Payer: Self-pay | Admitting: *Deleted

## 2013-07-28 NOTE — Telephone Encounter (Signed)
Pt called and was not able to get thru the MRI with xanax was to claustrophobic.  Needing to be sedated.  MC?

## 2013-07-31 NOTE — Telephone Encounter (Signed)
Dr. Pearlean Brownie consulted and ordered Ativan 0.5mg  (2 tabs) for MRI (which she thought this would be a 90 min total time).  I informed pt and she stated that she did not think this would work.  She would like to go to [facility] (conscious sedation).  I went back to Dr. Pearlean Brownie and he ok'd this.  Given to [facility] in MRI.

## 2013-08-05 NOTE — Patient Instructions (Signed)
MRI Screening  Pt Weight  220 (If over 300 lbs, notify MRI technologist)  Claustrophobia no  Ever worked around metal, filing, grinding, or welding? no  Always wore protective glasses? n/a  Ever got metal in your eyes? no If yes, was it removed by a doctor? no  Brain Surgery no  Inner Ear Surgery no  Renal/Liver Dz no  Heart Surgery no (if yes, what type and when )  Pacemaker no  High BP no  Eye Implants no  Pregnancy no  Diabetic no  Stent no  (If yes, date of placement n/a )   Hx of cancer{YES (If yes, what kind? cervical )   1.  Report to radiology on first floor on 08/07/13  At 1200/pm  2.  Do Not eat food after 0600        Do Not drink clear liquids after 0800  3.  If discharged the same day as procedure, you will need a responsible adult to drive          You home.  Who will drive you home Thelma Barge - Aunt 4.  If discharged the day of your procedure, a responsible adult should be with the patient      For 8 hours Aunt  5.  If plans include a taxi or bus for transportation home after procedure, a friend or family      member must accompany you n/a   6.  If taking routine medications, please list the names and dosages of all your                   medications, or bring these medications to the hospital for identification only takes Valium  7.  Take usual medications the morning of your procedure, except n/a 8.  Do you have a history of heart problems?  no  9.  Do you have a cardiologist? no  10.  Do you have any metal or implants inside your body? no  11.  Do you have any allergies to food or medications? Codeine and hydrocodone  12.  Do you have any allergies to latex or contrast dye? no  13.  To whom were these instructions given? Christine Shah

## 2013-08-07 ENCOUNTER — Encounter (HOSPITAL_COMMUNITY)
Admission: RE | Admit: 2013-08-07 | Discharge: 2013-08-07 | Disposition: A | Discharge status: Home / Self Care | Payer: 59 | Source: Ambulatory Visit | Attending: Neurology | Admitting: Neurology

## 2013-08-07 ENCOUNTER — Encounter (HOSPITAL_COMMUNITY): Admission: RE | Admit: 2013-08-07 | Payer: 59 | Source: Ambulatory Visit

## 2013-08-18 ENCOUNTER — Telehealth: Payer: Self-pay | Admitting: Family Medicine

## 2013-08-18 NOTE — Telephone Encounter (Signed)
She can schedule appt for Wed, and in meantime can try 800mg  ibuprofen TID with food, or 2 Aleve twice daily with food (she previously was rx'd naproxen 500)

## 2013-08-18 NOTE — Telephone Encounter (Signed)
Patient informed of Dr.Knapp's recommendations. She will call back if she desires to be seen Wed.

## 2013-08-20 ENCOUNTER — Encounter: Payer: Self-pay | Admitting: Family Medicine

## 2013-08-20 ENCOUNTER — Ambulatory Visit (INDEPENDENT_AMBULATORY_CARE_PROVIDER_SITE_OTHER): Payer: 59 | Admitting: Family Medicine

## 2013-08-20 VITALS — BP 108/62 | HR 84 | Ht 65.0 in | Wt 211.0 lb

## 2013-08-20 DIAGNOSIS — F17200 Nicotine dependence, unspecified, uncomplicated: Secondary | ICD-10-CM

## 2013-08-20 DIAGNOSIS — R0789 Other chest pain: Secondary | ICD-10-CM

## 2013-08-20 DIAGNOSIS — R071 Chest pain on breathing: Secondary | ICD-10-CM

## 2013-08-20 MED ORDER — KETOROLAC TROMETHAMINE 60 MG/2ML IM SOLN
60.0000 mg | Freq: Once | INTRAMUSCULAR | Status: AC
  Administered 2013-08-20: 60 mg via INTRAMUSCULAR
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
## 2013-08-20 NOTE — Patient Instructions (Addendum)
You need to wait 6 hours after injection today before starting ibuprofen.  Use 4 tablets (800mg  total dose) three times daily, and take with food.  Take this for 7-10 days, or until pain has resolved.  Given your tenderness at your stomach today, and starting anti-inflammatories that can bother your stomach, go ahead and start back on either the omeprazole or dexilant to prevent further stomach pain and/or ulcers.  Please try and quit smoking--start thinking about why/when you smoke (habit, boredom, stress) in order to come up with effective strategies to cut back or quit. Available resources to help you quit include free counseling through [facility]uitline ([place]uitline.com or 1-800-QUITNOW), smoking cessation classes through [facility] (call to find out schedule), over-the-counter nicotine replacements, and e-cigarettes (although this may not help break the hand-mouth habit).  Many insurance companies also have smoking cessation programs (which may decrease the cost of patches, meds if enrolled).  If these methods are not effective for you, and you are motivated to quit, return to discuss the possibility of prescription medications.

## 2013-10-07 ENCOUNTER — Ambulatory Visit: Payer: 59 | Admitting: Neurology

## 2013-10-09 ENCOUNTER — Other Ambulatory Visit: Payer: Self-pay

## 2013-10-17 ENCOUNTER — Encounter: Payer: Self-pay | Admitting: Family Medicine

## 2013-10-17 ENCOUNTER — Ambulatory Visit (INDEPENDENT_AMBULATORY_CARE_PROVIDER_SITE_OTHER): Payer: 59 | Admitting: Family Medicine

## 2013-10-17 VITALS — BP 116/70 | HR 73 | Temp 98.1°F | Wt 210.0 lb

## 2013-10-17 DIAGNOSIS — J069 Acute upper respiratory infection, unspecified: Secondary | ICD-10-CM

## 2013-10-17 NOTE — Patient Instructions (Signed)
Upper Respiratory Infection, Adult An upper respiratory infection (URI) is also known as the common cold. It is often caused by a type of germ (virus). Colds are easily spread (contagious). You can pass it to others by kissing, coughing, sneezing, or drinking out of the same glass. Usually, you get better in 1 or 2 weeks.  HOME CARE   Only take medicine as told by your doctor.  Use a warm mist humidifier or breathe in steam from a hot shower.  Drink enough water and fluids to keep your pee (urine) clear or pale yellow.  Get plenty of rest.  Return to work when your temperature is back to normal or as told by your doctor. You may use a face mask and wash your hands to stop your cold from spreading. GET HELP RIGHT AWAY IF:   After the first few days, you feel you are getting worse.  You have questions about your medicine.  You have chills, shortness of breath, or brown or red spit (mucus).  You have yellow or brown snot (nasal discharge) or pain in the face, especially when you bend forward.  You have a fever, puffy (swollen) neck, pain when you swallow, or white spots in the back of your throat.  You have a bad headache, ear pain, sinus pain, or chest pain.  You have a high-pitched whistling sound when you breathe in and out (wheezing).  You have a lasting cough or cough up blood.  You have sore muscles or a stiff neck. MAKE SURE YOU:   Understand these instructions.  Will watch your condition.  Will get help right away if you are not doing well or get worse. Document Released: 05/08/2008 Document Revised: 02/12/2012 Document Reviewed: 03/27/2011 [facility] Patient Information 2014 [place], [place]. Good handwashing helps keep this from spreading. Use NyQuil at night to help with the cough and any cough suppressant during the day. In 7-10 days you should be feeling better but not necessarily over it.
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
## 2013-11-05 ENCOUNTER — Encounter: Payer: Self-pay | Admitting: Family Medicine

## 2013-11-05 ENCOUNTER — Ambulatory Visit (INDEPENDENT_AMBULATORY_CARE_PROVIDER_SITE_OTHER): Payer: 59 | Admitting: Family Medicine

## 2013-11-05 VITALS — BP 100/64 | HR 76 | Ht 65.0 in | Wt 210.0 lb

## 2013-11-05 DIAGNOSIS — N920 Excessive and frequent menstruation with regular cycle: Secondary | ICD-10-CM

## 2013-11-05 NOTE — Patient Instructions (Signed)
Please return for re-evaluation if you develop bleeding/bruising elsewhere (to have CBC checked, to check platelets, which were normal in July and every other time they have been checked).  Return if you develop pelvic pain, fever, abnormal discharge, or other concerns.  Drink plenty of fluids--being dehydrated can also contribute to fatigue.  You are NOT anemic.  Avoid anti-inflammatories for the next few days, use tylenol as needed for pain.  In general, you do not need to avoid anti-inflammatories, so if you need to restart after your menstrual bleeding has improved, that is fine.
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
## 2014-01-09 ENCOUNTER — Encounter (HOSPITAL_COMMUNITY): Payer: Self-pay | Admitting: Emergency Medicine

## 2014-01-09 ENCOUNTER — Emergency Department (HOSPITAL_COMMUNITY)
Admission: EM | Admit: 2014-01-09 | Discharge: 2014-01-09 | Disposition: A | Discharge status: Home / Self Care | Payer: 59 | Attending: Emergency Medicine | Admitting: Emergency Medicine

## 2014-01-09 DIAGNOSIS — R002 Palpitations: Secondary | ICD-10-CM | POA: Insufficient documentation

## 2014-01-09 DIAGNOSIS — K219 Gastro-esophageal reflux disease without esophagitis: Secondary | ICD-10-CM | POA: Insufficient documentation

## 2014-01-09 DIAGNOSIS — Z8739 Personal history of other diseases of the musculoskeletal system and connective tissue: Secondary | ICD-10-CM | POA: Insufficient documentation

## 2014-01-09 DIAGNOSIS — Z860100 Personal history of colon polyps, unspecified: Secondary | ICD-10-CM | POA: Insufficient documentation

## 2014-01-09 DIAGNOSIS — F430 Acute stress reaction: Secondary | ICD-10-CM | POA: Insufficient documentation

## 2014-01-09 DIAGNOSIS — F411 Generalized anxiety disorder: Secondary | ICD-10-CM | POA: Insufficient documentation

## 2014-01-09 DIAGNOSIS — G8929 Other chronic pain: Secondary | ICD-10-CM | POA: Insufficient documentation

## 2014-01-09 DIAGNOSIS — F17200 Nicotine dependence, unspecified, uncomplicated: Secondary | ICD-10-CM | POA: Insufficient documentation

## 2014-01-09 DIAGNOSIS — Z8679 Personal history of other diseases of the circulatory system: Secondary | ICD-10-CM | POA: Insufficient documentation

## 2014-01-09 LAB — URINALYSIS, ROUTINE W REFLEX MICROSCOPIC
BILIRUBIN URINE: NEGATIVE
GLUCOSE, UA: NEGATIVE mg/dL
Hgb urine dipstick: NEGATIVE
KETONES UR: NEGATIVE mg/dL
Leukocytes, UA: NEGATIVE
Nitrite: NEGATIVE
Protein, ur: NEGATIVE mg/dL
Specific Gravity, Urine: 1.01 (ref 1.005–1.030)
Urobilinogen, UA: 1 mg/dL (ref 0.0–1.0)
pH: 6 (ref 5.0–8.0)

## 2014-01-09 LAB — CBC WITH DIFFERENTIAL/PLATELET
BASOS ABS: 0.1 10*3/uL (ref 0.0–0.1)
Basophils Relative: 1 % (ref 0–1)
EOS ABS: 0.6 10*3/uL (ref 0.0–0.7)
EOS PCT: 9 % — AB (ref 0–5)
HCT: 39.5 % (ref 36.0–46.0)
HEMOGLOBIN: 13.7 g/dL (ref 12.0–15.0)
LYMPHS PCT: 25 % (ref 12–46)
Lymphs Abs: 1.8 10*3/uL (ref 0.7–4.0)
MCH: 30.7 pg (ref 26.0–34.0)
MCHC: 34.7 g/dL (ref 30.0–36.0)
MCV: 88.6 fL (ref 78.0–100.0)
MONO ABS: 0.4 10*3/uL (ref 0.1–1.0)
Monocytes Relative: 5 % (ref 3–12)
NEUTROS PCT: 60 % (ref 43–77)
Neutro Abs: 4.3 10*3/uL (ref 1.7–7.7)
PLATELETS: 199 10*3/uL (ref 150–400)
RBC: 4.46 MIL/uL (ref 3.87–5.11)
RDW: 13.6 % (ref 11.5–15.5)
WBC: 7 10*3/uL (ref 4.0–10.5)

## 2014-01-09 LAB — BASIC METABOLIC PANEL
BUN: 8 mg/dL (ref 6–23)
CALCIUM: 8.7 mg/dL (ref 8.4–10.5)
CO2: 24 meq/L (ref 19–32)
CREATININE: 0.75 mg/dL (ref 0.50–1.10)
Chloride: 104 mEq/L (ref 96–112)
GFR calc Af Amer: >90 mL/min (ref >90)
GFR calc non Af Amer: >90 mL/min (ref >90)
GLUCOSE: 126 mg/dL — AB (ref 70–99)
Potassium: 4 mEq/L (ref 3.7–5.3)
Sodium: 140 mEq/L (ref 137–147)

## 2014-01-09 LAB — TROPONIN I: Troponin I: <0.3 ng/mL (ref <0.30)

## 2014-01-09 NOTE — ED Notes (Signed)
MD at bedside. 

## 2014-01-09 NOTE — ED Notes (Signed)
Pt c/o heart palpitations x 1 wk; increasing in intensity; shortness of chest; denies chest pain; c/o right lower abd pain

## 2014-01-09 NOTE — Discharge Instructions (Signed)
Return to the emergency department if you develop chest pain, difficulty breathing, or other new or concerning symptoms.   Palpitations  A palpitation is the feeling that your heartbeat is irregular or is faster than normal. It may feel like your heart is fluttering or skipping a beat. Palpitations are usually not a serious problem. However, in some cases, you may need further medical evaluation. CAUSES  Palpitations can be caused by:  Smoking.  Caffeine or other stimulants, such as diet pills or energy drinks.  Alcohol.  Stress and anxiety.  Strenuous physical activity.  Fatigue.  Certain medicines.  Heart disease, especially if you have a history of arrhythmias. This includes atrial fibrillation, atrial flutter, or supraventricular tachycardia.  An improperly working pacemaker or defibrillator. DIAGNOSIS  To find the cause of your palpitations, your caregiver will take your history and perform a physical exam. Tests may also be done, including:  Electrocardiography (ECG). This test records the heart's electrical activity.  Cardiac monitoring. This allows your caregiver to monitor your heart rate and rhythm in real time.  Holter monitor. This is a portable device that records your heartbeat and can help diagnose heart arrhythmias. It allows your caregiver to track your heart activity for several days, if needed.  Stress tests by exercise or by giving medicine that makes the heart beat faster. TREATMENT  Treatment of palpitations depends on the cause of your symptoms and can vary greatly. Most cases of palpitations do not require any treatment other than time, relaxation, and monitoring your symptoms. Other causes, such as atrial fibrillation, atrial flutter, or supraventricular tachycardia, usually require further treatment. HOME CARE INSTRUCTIONS   Avoid:  Caffeinated coffee, tea, soft drinks, diet pills, and energy drinks.  Chocolate.  Alcohol.  Stop smoking if you  smoke.  Reduce your stress and anxiety. Things that can help you relax include:  A method that measures bodily functions so you can learn to control them (biofeedback).  Yoga.  Meditation.  Physical activity such as swimming, jogging, or walking.  Get plenty of rest and sleep. SEEK MEDICAL CARE IF:   You continue to have a fast or irregular heartbeat beyond 24 hours.  Your palpitations occur more often. SEEK IMMEDIATE MEDICAL CARE IF:  You develop chest pain or shortness of breath.  You have a severe headache.  You feel dizzy, or you faint. MAKE SURE YOU:  Understand these instructions.  Will watch your condition.  Will get help right away if you are not doing well or get worse. Document Released: 11/17/2000 Document Revised: 03/17/2013 Document Reviewed: 01/19/2012 [facility] Patient Information 2014 [place].
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
## 2014-02-25 ENCOUNTER — Telehealth: Payer: Self-pay | Admitting: Family Medicine

## 2014-02-25 DIAGNOSIS — K219 Gastro-esophageal reflux disease without esophagitis: Secondary | ICD-10-CM

## 2014-02-25 MED ORDER — RABEPRAZOLE SODIUM 20 MG PO TBEC: 20.0000 mg | DELAYED_RELEASE_TABLET | Freq: Every day | ORAL | Status: DC

## 2014-02-25 NOTE — Telephone Encounter (Signed)
done

## 2014-03-04 ENCOUNTER — Telehealth: Payer: Self-pay | Admitting: Family Medicine

## 2014-03-04 ENCOUNTER — Other Ambulatory Visit: Payer: Self-pay | Admitting: *Deleted

## 2014-03-19 NOTE — Telephone Encounter (Signed)
tsd  °

## 2014-03-23 ENCOUNTER — Encounter: Payer: Self-pay | Admitting: Family Medicine

## 2014-03-23 ENCOUNTER — Ambulatory Visit (INDEPENDENT_AMBULATORY_CARE_PROVIDER_SITE_OTHER): Payer: 59 | Admitting: Family Medicine

## 2014-03-23 VITALS — BP 118/70 | HR 84 | Ht 65.0 in | Wt 214.0 lb

## 2014-03-23 DIAGNOSIS — T25229A Burn of second degree of unspecified foot, initial encounter: Secondary | ICD-10-CM

## 2014-03-23 MED ORDER — MUPIROCIN CALCIUM 2 % EX CREA: 1.0000 "application " | TOPICAL_CREAM | Freq: Three times a day (TID) | CUTANEOUS | Status: DC

## 2014-03-23 NOTE — Patient Instructions (Signed)
  Return if increasing swelling, pain, increasing size, drainage, fevers, or any other concerns.  Try and keep the area covered/protected while at work, but okay to leave open to air when easier to keep it clean.   Burn Care Your skin is a natural barrier to infection. It is the largest organ of your body. Burns damage this natural protection. To help prevent infection, it is very important to follow your caregiver's instructions in the care of your burn. Burns are classified as:  First degree. There is only redness of the skin (erythema). No scarring is expected.  Second degree. There is blistering of the skin. Scarring may occur with deeper burns.  Third degree. All layers of the skin are injured, and scarring is expected. HOME CARE INSTRUCTIONS   Wash your hands well before changing your bandage.  Change your bandage as often as directed by your caregiver.  Remove the old bandage. If the bandage sticks, you may soak it off with cool, clean water.  Cleanse the burn thoroughly but gently with mild soap and water.  Pat the area dry with a clean, dry cloth.  Apply a thin layer of antibacterial cream to the burn.  Apply a clean bandage as instructed by your caregiver.  Keep the bandage as clean and dry as possible.  Elevate the affected area for the first 24 hours, then as instructed by your caregiver.  Only take over-the-counter or prescription medicines for pain, discomfort, or fever as directed by your caregiver. SEEK IMMEDIATE MEDICAL CARE IF:   You develop excessive pain.  You develop redness, tenderness, swelling, or red streaks near the burn.  The burned area develops yellowish-white fluid (pus) or a bad smell.  You have a fever. MAKE SURE YOU:   Understand these instructions.  Will watch your condition.  Will get help right away if you are not doing well or get worse. Document Released: 11/20/2005 Document Revised: 02/12/2012 Document Reviewed:  04/12/2011 [facility] Patient Information 2014 [place], [place].
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
## 2014-07-13 ENCOUNTER — Telehealth: Payer: Self-pay | Admitting: *Deleted

## 2014-07-13 NOTE — Telephone Encounter (Signed)
error 

## 2014-07-15 ENCOUNTER — Encounter: Payer: Self-pay | Admitting: Family Medicine

## 2014-07-15 ENCOUNTER — Ambulatory Visit (INDEPENDENT_AMBULATORY_CARE_PROVIDER_SITE_OTHER): Payer: 59 | Admitting: Family Medicine

## 2014-07-15 VITALS — BP 100/68 | HR 84 | Ht 65.0 in | Wt 222.0 lb

## 2014-07-15 DIAGNOSIS — M62830 Muscle spasm of back: Secondary | ICD-10-CM

## 2014-07-15 DIAGNOSIS — R0789 Other chest pain: Secondary | ICD-10-CM

## 2014-07-15 DIAGNOSIS — M5380 Other specified dorsopathies, site unspecified: Secondary | ICD-10-CM

## 2014-07-15 DIAGNOSIS — R071 Chest pain on breathing: Secondary | ICD-10-CM

## 2014-07-15 DIAGNOSIS — N62 Hypertrophy of breast: Secondary | ICD-10-CM

## 2014-07-15 MED ORDER — DIAZEPAM 5 MG PO TABS: 5.0000 mg | ORAL_TABLET | Freq: Three times a day (TID) | ORAL | Status: DC | PRN

## 2014-07-15 NOTE — Patient Instructions (Signed)
Continue to use heat as needed, along with massage and stretches of the rhomboid muscles as shown. Use the valium as needed at night for muscle spasm. Try and work on your posture, as this adds strain to the back.  We are referring you for physical therapy, and for plastic surgery consultation for breast reduction.
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
## 2014-07-16 ENCOUNTER — Telehealth: Payer: Self-pay | Admitting: *Deleted

## 2014-07-16 NOTE — Telephone Encounter (Signed)
Called patient to let her know she is scheduled for plastic surgery consult with Dr.Bowers [address] 01/11/15 @ 9:00am. Faxed all pertinent records to [phone].

## 2014-08-18 ENCOUNTER — Telehealth: Payer: Self-pay | Admitting: *Deleted

## 2014-08-18 NOTE — Telephone Encounter (Signed)
I have not discussed anxiety with her recently, nor have I ever prescribed alprazolam to her.  She already took valium, which is a benzo (same class of med), and should not be mixed (and stays in system for 8 hrs).  She can schedule appt.  For today--work on relaxation techniques. If needed, she can take an extra valium--this may be very sedating.  Do NOT mix with any other sedating medications or alcohol.

## 2014-08-18 NOTE — Telephone Encounter (Signed)
Patient informed, she states that she cannot afford to come in for OV.

## 2014-08-18 NOTE — Telephone Encounter (Signed)
Patient called and was crying on the phone. Said she had a terrible day at work and had a panic attack. When she got home she took 1/2 of the Valium that was rx'd for back spasm without any relief. Wants to know if you could please call in alprazolam, she is having a hard time. Uses [facility].

## 2014-10-05 ENCOUNTER — Encounter: Payer: Self-pay | Admitting: Family Medicine

## 2014-12-21 ENCOUNTER — Encounter: Payer: Self-pay | Admitting: Family Medicine

## 2014-12-21 ENCOUNTER — Ambulatory Visit (INDEPENDENT_AMBULATORY_CARE_PROVIDER_SITE_OTHER): Payer: 59 | Admitting: Family Medicine

## 2014-12-21 VITALS — BP 120/82 | HR 72 | Ht 65.0 in | Wt 220.0 lb

## 2014-12-21 DIAGNOSIS — G629 Polyneuropathy, unspecified: Secondary | ICD-10-CM

## 2014-12-21 DIAGNOSIS — M79672 Pain in left foot: Secondary | ICD-10-CM

## 2014-12-21 LAB — POCT GLYCOSYLATED HEMOGLOBIN (HGB A1C): HEMOGLOBIN A1C: 5.3

## 2014-12-21 NOTE — Patient Instructions (Signed)
Try and wear thin socks, wide shoes, not tight. Consider trying low dose anti-inflammatory (if your stomach can tolerate) for a few days).  Return if increasing numbness/tingling, weakness, symptoms into the leg, back pain, fevers or other concerns.
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
## 2014-12-27 ENCOUNTER — Encounter (HOSPITAL_COMMUNITY): Payer: Self-pay | Admitting: Nurse Practitioner

## 2014-12-27 ENCOUNTER — Emergency Department (HOSPITAL_COMMUNITY)
Admission: EM | Admit: 2014-12-27 | Discharge: 2014-12-27 | Disposition: A | Discharge status: Home / Self Care | Payer: 59 | Attending: Emergency Medicine | Admitting: Emergency Medicine

## 2014-12-27 DIAGNOSIS — F419 Anxiety disorder, unspecified: Secondary | ICD-10-CM | POA: Diagnosis not present

## 2014-12-27 DIAGNOSIS — R197 Diarrhea, unspecified: Secondary | ICD-10-CM | POA: Insufficient documentation

## 2014-12-27 DIAGNOSIS — Z8739 Personal history of other diseases of the musculoskeletal system and connective tissue: Secondary | ICD-10-CM | POA: Insufficient documentation

## 2014-12-27 DIAGNOSIS — Z86010 Personal history of colonic polyps: Secondary | ICD-10-CM | POA: Insufficient documentation

## 2014-12-27 DIAGNOSIS — K047 Periapical abscess without sinus: Secondary | ICD-10-CM | POA: Diagnosis not present

## 2014-12-27 DIAGNOSIS — Z720 Tobacco use: Secondary | ICD-10-CM | POA: Insufficient documentation

## 2014-12-27 DIAGNOSIS — K088 Other specified disorders of teeth and supporting structures: Secondary | ICD-10-CM | POA: Diagnosis present

## 2014-12-27 DIAGNOSIS — G8929 Other chronic pain: Secondary | ICD-10-CM | POA: Insufficient documentation

## 2014-12-27 MED ORDER — AMOXICILLIN 500 MG PO CAPS: 500.0000 mg | ORAL_CAPSULE | Freq: Three times a day (TID) | ORAL | Status: DC

## 2014-12-27 NOTE — ED Notes (Signed)
Pt c/o facial swelling, onset today, denies pain remakes on tenderness on the lip. Denies any anaphylactic symptoms.
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
## 2014-12-27 NOTE — Discharge Instructions (Signed)
Take amoxicillin three times daily for 1 week. Follow up with the dentist for re-evaluation.  Facial Infection You have an infection of your face. This requires special attention to help prevent serious problems. Infections in facial wounds can cause poor healing and scars. They can also spread to deeper tissues, especially around the eye. Wound and dental infections can lead to sinusitis, infection of the eye socket, and even meningitis. Permanent damage to the skin, eye, and nervous system may result if facial infections are not treated properly. With severe infections, hospital care for IV antibiotic injections may be needed if they don't respond to oral antibiotics. Antibiotics must be taken for the full course to insure the infection is eliminated. If the infection came from a bad tooth, it may have to be extracted when the infection is under control. Warm compresses may be applied to reduce skin irritation and remove drainage. You might need a tetanus shot now if:  You cannot remember when your last tetanus shot was.  You have never had a tetanus shot.  The object that caused your wound was dirty. If you need a tetanus shot, and you decide not to get one, there is a rare chance of getting tetanus. Sickness from tetanus can be serious. If you got a tetanus shot, your arm may swell, get red and warm to the touch at the shot site. This is common and not a problem. SEEK IMMEDIATE MEDICAL CARE IF:   You have increased swelling, redness, or trouble breathing.  You have a severe headache, dizziness, nausea, or vomiting.  You develop problems with your eyesight.  You have a fever. Document Released: 12/28/2004 Document Revised: 02/12/2012 Document Reviewed: 11/20/2005 [facility] Patient Information 2015 [place], [place]. This information is not intended to replace advice given to you by your health care provider. Make sure you discuss any questions you have with your health care provider.  Food  Choices to Help Relieve Diarrhea When you have diarrhea, the foods you eat and your eating habits are very important. Choosing the right foods and drinks can help relieve diarrhea. Also, because diarrhea can last up to 7 days, you need to replace lost fluids and electrolytes (such as sodium, potassium, and chloride) in order to help prevent dehydration.  WHAT GENERAL GUIDELINES DO I NEED TO FOLLOW?  Slowly drink 1 cup (8 oz) of fluid for each episode of diarrhea. If you are getting enough fluid, your urine will be clear or pale yellow.  Eat starchy foods. Some good choices include white rice, white toast, pasta, low-fiber cereal, baked potatoes (without the skin), saltine crackers, and bagels.  Avoid large servings of any cooked vegetables.  Limit fruit to two servings per day. A serving is  cup or 1 small piece.  Choose foods with less than 2 g of fiber per serving.  Limit fats to less than 8 tsp (38 g) per day.  Avoid fried foods.  Eat foods that have probiotics in them. Probiotics can be found in certain dairy products.  Avoid foods and beverages that may increase the speed at which food moves through the stomach and intestines (gastrointestinal tract). Things to avoid include:  High-fiber foods, such as dried fruit, raw fruits and vegetables, nuts, seeds, and whole grain foods.  Spicy foods and high-fat foods.  Foods and beverages sweetened with high-fructose corn syrup, honey, or sugar alcohols such as xylitol, sorbitol, and mannitol. WHAT FOODS ARE RECOMMENDED? Grains White rice. White, [place], or pita breads (fresh or toasted), including  plain rolls, buns, or bagels. White pasta. Saltine, soda, or graham crackers. Pretzels. Low-fiber cereal. Cooked cereals made with water (such as cornmeal, farina, or cream cereals). Plain muffins. Matzo. Melba toast. Zwieback.  Vegetables Potatoes (without the skin). Strained tomato and vegetable juices. Most well-cooked and canned vegetables  without seeds. Tender lettuce. Fruits Cooked or canned applesauce, apricots, cherries, fruit cocktail, grapefruit, peaches, pears, or plums. Fresh bananas, apples without skin, cherries, grapes, cantaloupe, grapefruit, peaches, oranges, or plums.  Meat and Other Protein Products Baked or boiled chicken. Eggs. Tofu. Fish. Seafood. Smooth peanut butter. Ground or well-cooked tender beef, ham, veal, lamb, pork, or poultry.  Dairy Plain yogurt, kefir, and unsweetened liquid yogurt. Lactose-free milk, buttermilk, or soy milk. Plain hard cheese. Beverages Sport drinks. Clear broths. Diluted fruit juices (except prune). Regular, caffeine-free sodas such as ginger ale. Water. Decaffeinated teas. Oral rehydration solutions. Sugar-free beverages not sweetened with sugar alcohols. Other Bouillon, broth, or soups made from recommended foods.  The items listed above may not be a complete list of recommended foods or beverages. Contact your dietitian for more options. WHAT FOODS ARE NOT RECOMMENDED? Grains Whole grain, whole wheat, bran, or rye breads, rolls, pastas, crackers, and cereals. Wild or brown rice. Cereals that contain more than 2 g of fiber per serving. Corn tortillas or taco shells. Cooked or dry oatmeal. Granola. Popcorn. Vegetables Raw vegetables. Cabbage, broccoli, Brussels sprouts, artichokes, baked beans, beet greens, corn, kale, legumes, peas, sweet potatoes, and yams. Potato skins. Cooked spinach and cabbage. Fruits Dried fruit, including raisins and dates. Raw fruits. Stewed or dried prunes. Fresh apples with skin, apricots, mangoes, pears, raspberries, and strawberries.  Meat and Other Protein Products Chunky peanut butter. Nuts and seeds. Beans and lentils. Berniece Salines.  Dairy High-fat cheeses. Milk, chocolate milk, and beverages made with milk, such as milk shakes. Cream. Ice cream. Sweets and Desserts Sweet rolls, doughnuts, and sweet breads. Pancakes and waffles. Fats and Oils Butter.  Cream sauces. Margarine. Salad oils. Plain salad dressings. Olives. Avocados.  Beverages Caffeinated beverages (such as coffee, tea, soda, or energy drinks). Alcoholic beverages. Fruit juices with pulp. Prune juice. Soft drinks sweetened with high-fructose corn syrup or sugar alcohols. Other Coconut. Hot sauce. Chili powder. Mayonnaise. Gravy. Cream-based or milk-based soups.  The items listed above may not be a complete list of foods and beverages to avoid. Contact your dietitian for more information. WHAT SHOULD I DO IF I BECOME DEHYDRATED? Diarrhea can sometimes lead to dehydration. Signs of dehydration include dark urine and dry mouth and skin. If you think you are dehydrated, you should rehydrate with an oral rehydration solution. These solutions can be purchased at pharmacies, retail stores, or online.  Drink -1 cup (120-240 mL) of oral rehydration solution each time you have an episode of diarrhea. If drinking this amount makes your diarrhea worse, try drinking smaller amounts more often. For example, drink 1-3 tsp (5-15 mL) every 5-10 minutes.  A general rule for staying hydrated is to drink 1-2 L of fluid per day. Talk to your health care provider about the specific amount you should be drinking each day. Drink enough fluids to keep your urine clear or pale yellow. Document Released: 02/10/2004 Document Revised: 11/25/2013 Document Reviewed: 10/13/2013 [facility] Patient Information 2015 [place], [place]. This information is not intended to replace advice given to you by your health care provider. Make sure you discuss any questions you have with your health care provider.

## 2015-01-07 ENCOUNTER — Encounter: Payer: Self-pay | Admitting: Family Medicine

## 2015-01-07 ENCOUNTER — Ambulatory Visit
Admission: RE | Admit: 2015-01-07 | Discharge: 2015-01-07 | Disposition: A | Discharge status: Home / Self Care | Payer: 59 | Source: Ambulatory Visit | Attending: Family Medicine | Admitting: Family Medicine

## 2015-01-07 ENCOUNTER — Ambulatory Visit (INDEPENDENT_AMBULATORY_CARE_PROVIDER_SITE_OTHER): Payer: 59 | Admitting: Family Medicine

## 2015-01-07 VITALS — BP 130/80 | HR 76 | Ht 65.0 in | Wt 216.0 lb

## 2015-01-07 DIAGNOSIS — M545 Low back pain: Secondary | ICD-10-CM

## 2015-01-07 LAB — POCT URINALYSIS DIPSTICK
Bilirubin, UA: NEGATIVE
Glucose, UA: NEGATIVE
Ketones, UA: NEGATIVE
LEUKOCYTES UA: NEGATIVE
Nitrite, UA: NEGATIVE
PH UA: 6
Protein, UA: NEGATIVE
Spec Grav, UA: 1.025
Urobilinogen, UA: NEGATIVE

## 2015-01-07 MED ORDER — DEXLANSOPRAZOLE 60 MG PO CPDR: 60.0000 mg | DELAYED_RELEASE_CAPSULE | Freq: Every day | ORAL | Status: DC

## 2015-01-07 MED ORDER — TRAMADOL HCL 50 MG PO TABS: 50.0000 mg | ORAL_TABLET | Freq: Four times a day (QID) | ORAL | Status: DC | PRN

## 2015-01-07 MED ORDER — KETOROLAC TROMETHAMINE 60 MG/2ML IM SOLN
60.0000 mg | Freq: Once | INTRAMUSCULAR | Status: AC
  Administered 2015-01-07: 60 mg via INTRAMUSCULAR

## 2015-01-07 NOTE — Patient Instructions (Signed)
Go to [facility] for x-rays of your back ([address]) Ibuprofen 800mg  three times daily with food--take the dexilant once daily while taking the ibuprofen. Wait 6-8 hours after the toradol injection you were given in the office before starting ibuprofen. Use the tramadol as needed for severe pain.  Back Pain, Adult Low back pain is very common. About 1 in 5 people have back pain.The cause of low back pain is rarely dangerous. The pain often gets better over time.About half of people with a sudden onset of back pain feel better in just 2 weeks. About 8 in 10 people feel better by 6 weeks.  CAUSES Some common causes of back pain include:  Strain of the muscles or ligaments supporting the spine.  Wear and tear (degeneration) of the spinal discs.  Arthritis.  Direct injury to the back. DIAGNOSIS Most of the time, the direct cause of low back pain is not known.However, back pain can be treated effectively even when the exact cause of the pain is unknown.Answering your caregiver's questions about your overall health and symptoms is one of the most accurate ways to make sure the cause of your pain is not dangerous. If your caregiver needs more information, he or she may order lab work or imaging tests (X-rays or MRIs).However, even if imaging tests show changes in your back, this usually does not require surgery. HOME CARE INSTRUCTIONS For many people, back pain returns.Since low back pain is rarely dangerous, it is often a condition that people can learn to [facility] their own.   Remain active. It is stressful on the back to sit or stand in one place. Do not sit, drive, or stand in one place for more than 30 minutes at a time. Take short walks on level surfaces as soon as pain allows.Try to increase the length of time you walk each day.  Do not stay in bed.Resting more than 1 or 2 days can delay your recovery.  Do not avoid exercise or work.Your body is made to  move.It is not dangerous to be active, even though your back may hurt.Your back will likely heal faster if you return to being active before your pain is gone.  Pay attention to your body when you bend and lift. Many people have less discomfortwhen lifting if they bend their knees, keep the load close to their bodies,and avoid twisting. Often, the most comfortable positions are those that put less stress on your recovering back.  Find a comfortable position to sleep. Use a firm mattress and lie on your side with your knees slightly bent. If you lie on your back, put a pillow under your knees.  Only take over-the-counter or prescription medicines as directed by your caregiver. Over-the-counter medicines to reduce pain and inflammation are often the most helpful.Your caregiver may prescribe muscle relaxant drugs.These medicines help dull your pain so you can more quickly return to your normal activities and healthy exercise.  Put ice on the injured area.  Put ice in a plastic bag.  Place a towel between your skin and the bag.  Leave the ice on for 15-20 minutes, 03-04 times a day for the first 2 to 3 days. After that, ice and heat may be alternated to reduce pain and spasms.  Ask your caregiver about trying back exercises and gentle massage. This may be of some benefit.  Avoid feeling anxious or stressed.Stress increases muscle tension and can worsen back pain.It is important to recognize when you are  anxious or stressed and learn ways to manage it.Exercise is a great option. SEEK MEDICAL CARE IF:  You have pain that is not relieved with rest or medicine.  You have pain that does not improve in 1 week.  You have new symptoms.  You are generally not feeling well. SEEK IMMEDIATE MEDICAL CARE IF:   You have pain that radiates from your back into your legs.  You develop new bowel or bladder control problems.  You have unusual weakness or numbness in your arms or legs.  You  develop nausea or vomiting.  You develop abdominal pain.  You feel faint. Document Released: 11/20/2005 Document Revised: 05/21/2012 Document Reviewed: 03/24/2014 [facility] Patient Information 2015 [place], [place]. This information is not intended to replace advice given to you by your health care provider. Make sure you discuss any questions you have with your health care provider.

## 2015-01-07 NOTE — Progress Notes (Signed)
Chief Complaint  Patient presents with  . Back Pain    since Saturday. Started in the middle of low back and now radiating left and right.    5 days ago she woke up with pain in the center of her lower back. The night before she had been standing at a concert, no jumping or change in activity. Had a slight pulling sensation in certain positions, and pressure on her lower spine when she leans back.  2 days ago the pain started radiating to both sides of the lower back, rather than staying just in center.  She is also having a "burny achey" sensation in the entire left thigh (front/back/side, everywhere).  No radiation below the knee.  No paresthesias or weakness.  Denies change in activity, injury, trauma or fall.  She feels better when leaning forwards while standing, sharp pain in center of back when standing up straight. Interferes some with her sleep.  She took 800mg  ibuprofen just once, didn't help. She doesn't like taking NSAIDs because they bother her stomach. She also took a pain pill from dental clinic--kept her awake, didn't help with pain (hydrocodone-APAP).  This medication sometimes causes itching.  She has had a runny nose, cough and cold symptoms over the last week, as has her son.  This has improved.  Mucus has always been clear, using Neti-pot.  Cough was never too bad, mostly head congestion/drainage.  PMH, PSH, SH reviewed. Meds: ibuprofen and vicodin as described above. Allergies  Allergen Reactions  . Codeine Other (See Comments)    Migraine.  . Hydrocodone Itching   ROS:  Denies fevers, chills.  Denies urinary complaints (no urgency/frequency/hematuria--currently on cycle).  No nausea, vomiting, diarrhea/bowel changes. Denies lesions or skin rashes, bleeding/bruising.  PHYSICAL EXAM: BP 130/80 mmHg  Pulse 76  Ht 5\' 5"  (1.651 m)  Wt 216 lb (97.977 kg)  BMI 35.94 kg/m2  LMP 01/04/2014 Well developed female, in mild discomfort, especially when sitting on exam table.   Appeared more comfortable sitting in chair, leaning forward Back: Tender at lower lumbar spine (L2-5). No SI joint tenderness, no muscle spasm. Skin intact without rashes.  Negative SLR Neuro: alert and oriented. DTR's 2+ and symmetric. Normal strength, sensation (to light touch and temp). Normal gait Heart: regular rate and rhythm Lungs: clear Extremities: no edema  Urine dip: 1+ blood (on menses), otherwise normal  ASSESSMENT/PLAN:  Low back pain, unspecified back pain laterality, with sciatica presence unspecified - Plan: POCT Urinalysis Dipstick, DG Lumbar Spine Complete, traMADol (ULTRAM) 50 MG tablet, ketorolac (TORADOL) injection 60 mg  No evidence of significant radiculopathy (neg SLR, normal neuro exam) or muscle spasm on exam. Tramadol#30, written rx given, risks/side effects reviewed. toradol 60mg  IM dexilant samples X-ray lumbar spine.   Go to [facility] for x-rays of your back ([address]) Ibuprofen 800mg  three times daily with food--take the dexilant once daily while taking the ibuprofen. Wait 6-8 hours after the toradol injection you were given in the office before starting ibuprofen. Use the tramadol as needed for severe pain.   F/u prn persistent/worsening sx

## 2015-01-08 ENCOUNTER — Telehealth: Payer: Self-pay | Admitting: Internal Medicine

## 2015-01-08 MED ORDER — ALPRAZOLAM 0.25 MG PO TABS: 0.2500 mg | ORAL_TABLET | Freq: Three times a day (TID) | ORAL | Status: DC | PRN

## 2015-01-08 NOTE — Telephone Encounter (Signed)
Advise her to check her MyChart (I sent her message with results) yesterday. X-rays were normal.  Has she been taking 800mg  ibuprofen three times daily (along with the samples of Dexilant)? Next step might need to be steroids if failing NSAIDs

## 2015-01-08 NOTE — Telephone Encounter (Signed)
Advise pt that she can get on MyChart through her phone if she gets the MyChart app (if she wants to).  Message from [place] says she has NOT been taking ibuprofen.  Ibuprofen is an anti-inflammatory, which could help decrease inflammation of the nerves and help with her pain/numbness. If she takes it along with the [place] samples, it might not bother her stomach too much.  If she prefers rx 800mg  to take TID, we can send rx.  If she prefers naproxen 500mg  twice daily we can send rx for that (I only suggested the OTC because she already had it at home, and had taken one dose of 800mg  prior to her visit here).  If she can't tolerate the NSAIDs, then we can do a medrol dosepak (but she should try it first)

## 2015-01-08 NOTE — Telephone Encounter (Signed)
Patient advised. Rx sent. 

## 2015-01-08 NOTE — Telephone Encounter (Signed)
Pt has not heard back about her xray that she had done yesterday. Pt is experiencing some numbness in her feet and having severe low back pain. It is worse than yesterday when she was seen

## 2015-01-08 NOTE — Telephone Encounter (Signed)
Okay for alprazolam.  She previously had both doses (0.25 and 0.5--I believe she had the 0.5 more for sleep, and shouldn't be used during the day, more sedating). Okay for 0.25mg  #20, no refill.  Use 1 TID prn anxiety. This is for short-term use only, for severe anxiety.  If having ongoing anxiety, and requiring this medication daily, needs OV to discuss anxiety.

## 2015-01-08 NOTE — Telephone Encounter (Signed)
Spoke with patient and she will just use OTC IBU 800mg  TID-she will call back of not tolerating. She did ask for xanax because her anxiety is extremely high. She states that she is worried about having cancer. Her sister was being treated for UTI x 2 months but she actually had stage 4 liver and stomach cancer and a spinal tumor as well. The Valium she has do not help with this anxiety.

## 2015-01-08 NOTE — Telephone Encounter (Signed)
Pt states she can not get on her mychart with her phone. She has not been taking ibuprofen and dexilant. Pt was told her xrays were normal but asked what does she need to do about her numbness and her pain.

## 2015-01-31 ENCOUNTER — Emergency Department (HOSPITAL_COMMUNITY): Payer: 59

## 2015-01-31 ENCOUNTER — Emergency Department (HOSPITAL_COMMUNITY)
Admission: EM | Admit: 2015-01-31 | Discharge: 2015-01-31 | Disposition: A | Discharge status: Home / Self Care | Payer: 59 | Attending: Emergency Medicine | Admitting: Emergency Medicine

## 2015-01-31 ENCOUNTER — Encounter (HOSPITAL_COMMUNITY): Payer: Self-pay

## 2015-01-31 DIAGNOSIS — Z86010 Personal history of colonic polyps: Secondary | ICD-10-CM | POA: Insufficient documentation

## 2015-01-31 DIAGNOSIS — K219 Gastro-esophageal reflux disease without esophagitis: Secondary | ICD-10-CM | POA: Diagnosis not present

## 2015-01-31 DIAGNOSIS — Z8679 Personal history of other diseases of the circulatory system: Secondary | ICD-10-CM | POA: Diagnosis not present

## 2015-01-31 DIAGNOSIS — Z9889 Other specified postprocedural states: Secondary | ICD-10-CM | POA: Insufficient documentation

## 2015-01-31 DIAGNOSIS — G8929 Other chronic pain: Secondary | ICD-10-CM | POA: Insufficient documentation

## 2015-01-31 DIAGNOSIS — Z8739 Personal history of other diseases of the musculoskeletal system and connective tissue: Secondary | ICD-10-CM | POA: Diagnosis not present

## 2015-01-31 DIAGNOSIS — Z9851 Tubal ligation status: Secondary | ICD-10-CM | POA: Insufficient documentation

## 2015-01-31 DIAGNOSIS — R1012 Left upper quadrant pain: Secondary | ICD-10-CM | POA: Diagnosis present

## 2015-01-31 DIAGNOSIS — Z3202 Encounter for pregnancy test, result negative: Secondary | ICD-10-CM | POA: Diagnosis not present

## 2015-01-31 DIAGNOSIS — Z79899 Other long term (current) drug therapy: Secondary | ICD-10-CM | POA: Diagnosis not present

## 2015-01-31 DIAGNOSIS — F419 Anxiety disorder, unspecified: Secondary | ICD-10-CM | POA: Insufficient documentation

## 2015-01-31 DIAGNOSIS — R197 Diarrhea, unspecified: Secondary | ICD-10-CM | POA: Insufficient documentation

## 2015-01-31 DIAGNOSIS — Z720 Tobacco use: Secondary | ICD-10-CM | POA: Insufficient documentation

## 2015-01-31 LAB — CBC WITH DIFFERENTIAL/PLATELET
BASOS PCT: 1 % (ref 0–1)
Basophils Absolute: 0.1 10*3/uL (ref 0.0–0.1)
EOS PCT: 3 % (ref 0–5)
Eosinophils Absolute: 0.2 10*3/uL (ref 0.0–0.7)
HCT: 43.8 % (ref 36.0–46.0)
Hemoglobin: 15.1 g/dL — ABNORMAL HIGH (ref 12.0–15.0)
LYMPHS PCT: 23 % (ref 12–46)
Lymphs Abs: 1.7 10*3/uL (ref 0.7–4.0)
MCH: 31.3 pg (ref 26.0–34.0)
MCHC: 34.5 g/dL (ref 30.0–36.0)
MCV: 90.7 fL (ref 78.0–100.0)
MONO ABS: 0.4 10*3/uL (ref 0.1–1.0)
MONOS PCT: 5 % (ref 3–12)
NEUTROS ABS: 5.1 10*3/uL (ref 1.7–7.7)
Neutrophils Relative %: 68 % (ref 43–77)
PLATELETS: 220 10*3/uL (ref 150–400)
RBC: 4.83 MIL/uL (ref 3.87–5.11)
RDW: 13 % (ref 11.5–15.5)
WBC: 7.5 10*3/uL (ref 4.0–10.5)

## 2015-01-31 LAB — COMPREHENSIVE METABOLIC PANEL
ALT: 14 U/L (ref 0–35)
AST: 15 U/L (ref 0–37)
Albumin: 4.2 g/dL (ref 3.5–5.2)
Alkaline Phosphatase: 59 U/L (ref 39–117)
Anion gap: 8 (ref 5–15)
BUN: 9 mg/dL (ref 6–23)
CALCIUM: 9.1 mg/dL (ref 8.4–10.5)
CHLORIDE: 109 mmol/L (ref 96–112)
CO2: 23 mmol/L (ref 19–32)
CREATININE: 0.62 mg/dL (ref 0.50–1.10)
GFR calc Af Amer: >90 mL/min (ref >90)
GFR calc non Af Amer: >90 mL/min (ref >90)
GLUCOSE: 104 mg/dL — AB (ref 70–99)
Potassium: 3.5 mmol/L (ref 3.5–5.1)
Sodium: 140 mmol/L (ref 135–145)
Total Bilirubin: 0.8 mg/dL (ref 0.3–1.2)
Total Protein: 6.8 g/dL (ref 6.0–8.3)

## 2015-01-31 LAB — URINALYSIS, ROUTINE W REFLEX MICROSCOPIC
Bilirubin Urine: NEGATIVE
GLUCOSE, UA: NEGATIVE mg/dL
Ketones, ur: 15 mg/dL — AB
Leukocytes, UA: NEGATIVE
Nitrite: NEGATIVE
PH: 5.5 (ref 5.0–8.0)
Protein, ur: NEGATIVE mg/dL
Specific Gravity, Urine: 1.025 (ref 1.005–1.030)
UROBILINOGEN UA: 1 mg/dL (ref 0.0–1.0)

## 2015-01-31 LAB — I-STAT TROPONIN, ED: Troponin i, poc: 0 ng/mL (ref 0.00–0.08)

## 2015-01-31 LAB — URINE MICROSCOPIC-ADD ON

## 2015-01-31 LAB — POC URINE PREG, ED: Preg Test, Ur: NEGATIVE

## 2015-01-31 LAB — LIPASE, BLOOD: LIPASE: 20 U/L (ref 11–59)

## 2015-01-31 MED ORDER — NAPROXEN 500 MG PO TABS: 500.0000 mg | ORAL_TABLET | Freq: Two times a day (BID) | ORAL | Status: DC

## 2015-01-31 MED ORDER — IOHEXOL 300 MG/ML  SOLN
25.0000 mL | Freq: Once | INTRAMUSCULAR | Status: AC | PRN
  Administered 2015-01-31: 25 mL via ORAL

## 2015-01-31 MED ORDER — IOHEXOL 300 MG/ML  SOLN
100.0000 mL | Freq: Once | INTRAMUSCULAR | Status: AC | PRN
  Administered 2015-01-31: 100 mL via INTRAVENOUS

## 2015-01-31 MED ORDER — PROMETHAZINE HCL 25 MG PO TABS: 25.0000 mg | ORAL_TABLET | Freq: Four times a day (QID) | ORAL | Status: DC | PRN

## 2015-01-31 NOTE — Discharge Instructions (Signed)
You have some sludge in your gallbladder and diverticulosis,  but no diverticulitis.   Additionally, there was a small amount of blood noted in your urine.   THis will need to be rechecked at some point.   Medication for pain and nausea. Follow-up your primary care doctor.

## 2015-01-31 NOTE — ED Notes (Signed)
Patient transported to CT 

## 2015-01-31 NOTE — ED Notes (Signed)
Pt c/o LUQ abdominal pain radiating into epigastric region x 1 year and diarrhea starting this morning.  Pain score 6/10 increasing w/ eating.  Pt reports that abdominal pain "sometimes feels like a pulsation."  Sts "my doctor thinks that it is just anxiety."
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
## 2015-02-03 ENCOUNTER — Encounter (HOSPITAL_COMMUNITY): Payer: Self-pay | Admitting: *Deleted

## 2015-02-03 ENCOUNTER — Inpatient Hospital Stay: Payer: 59 | Admitting: Family Medicine

## 2015-02-03 ENCOUNTER — Emergency Department (HOSPITAL_COMMUNITY)
Admission: EM | Admit: 2015-02-03 | Discharge: 2015-02-03 | Disposition: A | Discharge status: Home / Self Care | Payer: 59 | Attending: Emergency Medicine | Admitting: Emergency Medicine

## 2015-02-03 ENCOUNTER — Emergency Department (HOSPITAL_COMMUNITY): Payer: 59

## 2015-02-03 DIAGNOSIS — K219 Gastro-esophageal reflux disease without esophagitis: Secondary | ICD-10-CM | POA: Insufficient documentation

## 2015-02-03 DIAGNOSIS — F419 Anxiety disorder, unspecified: Secondary | ICD-10-CM | POA: Insufficient documentation

## 2015-02-03 DIAGNOSIS — Z79899 Other long term (current) drug therapy: Secondary | ICD-10-CM | POA: Insufficient documentation

## 2015-02-03 DIAGNOSIS — R109 Unspecified abdominal pain: Secondary | ICD-10-CM | POA: Diagnosis not present

## 2015-02-03 DIAGNOSIS — G8929 Other chronic pain: Secondary | ICD-10-CM | POA: Insufficient documentation

## 2015-02-03 DIAGNOSIS — R05 Cough: Secondary | ICD-10-CM | POA: Insufficient documentation

## 2015-02-03 DIAGNOSIS — R0602 Shortness of breath: Secondary | ICD-10-CM | POA: Insufficient documentation

## 2015-02-03 DIAGNOSIS — Z8719 Personal history of other diseases of the digestive system: Secondary | ICD-10-CM | POA: Insufficient documentation

## 2015-02-03 DIAGNOSIS — Z8679 Personal history of other diseases of the circulatory system: Secondary | ICD-10-CM | POA: Diagnosis not present

## 2015-02-03 DIAGNOSIS — Z720 Tobacco use: Secondary | ICD-10-CM | POA: Diagnosis not present

## 2015-02-03 DIAGNOSIS — Z86010 Personal history of colonic polyps: Secondary | ICD-10-CM | POA: Diagnosis not present

## 2015-02-03 DIAGNOSIS — M549 Dorsalgia, unspecified: Secondary | ICD-10-CM | POA: Insufficient documentation

## 2015-02-03 MED ORDER — DM-GUAIFENESIN ER 30-600 MG PO TB12: 1.0000 | ORAL_TABLET | Freq: Two times a day (BID) | ORAL | Status: DC

## 2015-02-03 MED ORDER — ALBUTEROL SULFATE HFA 108 (90 BASE) MCG/ACT IN AERS: 1.0000 | INHALATION_SPRAY | Freq: Four times a day (QID) | RESPIRATORY_TRACT | Status: DC | PRN

## 2015-02-03 NOTE — Discharge Instructions (Signed)
Chest x-ray was negative. Symptoms may be related to a bronchitis. Take the albuterol inhaler 2 puffs every 6 hours as directed do that for the next week. Take Mucinex DM as directed. Follow-up with your doctor. Return for any new or worse symptoms.

## 2015-02-03 NOTE — ED Notes (Signed)
Patient returned from xray.
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
## 2015-02-03 NOTE — ED Notes (Signed)
Pt. Was seen Sunday at [facility] long for abdominal pain and SOB was dx with diverticulitis. Pt. States the abdominal was take care of there but nothing was done for the SOB. Pt. States the SOB continues and the abdominal pain is more of a pressure now. 10/10 pressure

## 2015-02-03 NOTE — ED Notes (Signed)
Patient transported to X-ray 

## 2015-02-03 NOTE — ED Notes (Signed)
Dr. Rogene Houston at bedside with the patient.
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
## 2015-02-09 ENCOUNTER — Encounter (HOSPITAL_COMMUNITY): Payer: Self-pay | Admitting: Emergency Medicine

## 2015-02-09 ENCOUNTER — Emergency Department (HOSPITAL_COMMUNITY)
Admission: EM | Admit: 2015-02-09 | Discharge: 2015-02-09 | Disposition: A | Discharge status: Home / Self Care | Payer: 59 | Source: Home / Self Care | Attending: Family Medicine | Admitting: Family Medicine

## 2015-02-09 DIAGNOSIS — J0100 Acute maxillary sinusitis, unspecified: Secondary | ICD-10-CM

## 2015-02-09 MED ORDER — PREDNISONE 10 MG PO TABS: 30.0000 mg | ORAL_TABLET | Freq: Every day | ORAL | Status: DC

## 2015-02-09 MED ORDER — IPRATROPIUM BROMIDE 0.06 % NA SOLN: 2.0000 | Freq: Four times a day (QID) | NASAL | Status: DC

## 2015-02-09 NOTE — ED Notes (Signed)
C/o sinus pressure and pain, reports low grade temperature.

## 2015-02-09 NOTE — Discharge Instructions (Signed)
Thank you for coming in today. Take prednisone daily.  Use the nasal spray.  Call or go to the emergency room if you get worse, have trouble breathing, have chest pains, or palpitations.   Sinusitis Sinusitis is redness, soreness, and inflammation of the paranasal sinuses. Paranasal sinuses are air pockets within the bones of your face (beneath the eyes, the middle of the forehead, or above the eyes). In healthy paranasal sinuses, mucus is able to drain out, and air is able to circulate through them by way of your nose. However, when your paranasal sinuses are inflamed, mucus and air can become trapped. This can allow bacteria and other germs to grow and cause infection. Sinusitis can develop quickly and last only a short time (acute) or continue over a long period (chronic). Sinusitis that lasts for more than 12 weeks is considered chronic.  CAUSES  Causes of sinusitis include:  Allergies.  Structural abnormalities, such as displacement of the cartilage that separates your nostrils (deviated septum), which can decrease the air flow through your nose and sinuses and affect sinus drainage.  Functional abnormalities, such as when the small hairs (cilia) that line your sinuses and help remove mucus do not work properly or are not present. SIGNS AND SYMPTOMS  Symptoms of acute and chronic sinusitis are the same. The primary symptoms are pain and pressure around the affected sinuses. Other symptoms include:  Upper toothache.  Earache.  Headache.  Bad breath.  Decreased sense of smell and taste.  A cough, which worsens when you are lying flat.  Fatigue.  Fever.  Thick drainage from your nose, which often is green and may contain pus (purulent).  Swelling and warmth over the affected sinuses. DIAGNOSIS  Your health care provider will perform a physical exam. During the exam, your health care provider may:  Look in your nose for signs of abnormal growths in your nostrils (nasal  polyps).  Tap over the affected sinus to check for signs of infection.  View the inside of your sinuses (endoscopy) using an imaging device that has a light attached (endoscope). If your health care provider suspects that you have chronic sinusitis, one or more of the following tests may be recommended:  Allergy tests.  Nasal culture. A sample of mucus is taken from your nose, sent to a lab, and screened for bacteria.  Nasal cytology. A sample of mucus is taken from your nose and examined by your health care provider to determine if your sinusitis is related to an allergy. TREATMENT  Most cases of acute sinusitis are related to a viral infection and will resolve on their own within 10 days. Sometimes medicines are prescribed to help relieve symptoms (pain medicine, decongestants, nasal steroid sprays, or saline sprays).  However, for sinusitis related to a bacterial infection, your health care provider will prescribe antibiotic medicines. These are medicines that will help kill the bacteria causing the infection.  Rarely, sinusitis is caused by a fungal infection. In theses cases, your health care provider will prescribe antifungal medicine. For some cases of chronic sinusitis, surgery is needed. Generally, these are cases in which sinusitis recurs more than 3 times per year, despite other treatments. HOME CARE INSTRUCTIONS   Drink plenty of water. Water helps thin the mucus so your sinuses can drain more easily.  Use a humidifier.  Inhale steam 3 to 4 times a day (for example, sit in the bathroom with the shower running).  Apply a warm, moist washcloth to your face 3 to  4 times a day, or as directed by your health care provider.  Use saline nasal sprays to help moisten and clean your sinuses.  Take medicines only as directed by your health care provider.  If you were prescribed either an antibiotic or antifungal medicine, finish it all even if you start to feel better. SEEK IMMEDIATE  MEDICAL CARE IF:  You have increasing pain or severe headaches.  You have nausea, vomiting, or drowsiness.  You have swelling around your face.  You have vision problems.  You have a stiff neck.  You have difficulty breathing. MAKE SURE YOU:   Understand these instructions.  Will watch your condition.  Will get help right away if you are not doing well or get worse. Document Released: 11/20/2005 Document Revised: 04/06/2014 Document Reviewed: 12/05/2011 [facility] Patient Information 2015 [place], [place]. This information is not intended to replace advice given to you by your health care provider. Make sure you discuss any questions you have with your health care provider. Marland Kitchen

## 2015-02-09 NOTE — ED Notes (Signed)
Patient is being seen with her son with similar symptoms, same provider.
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
## 2015-02-24 ENCOUNTER — Ambulatory Visit (INDEPENDENT_AMBULATORY_CARE_PROVIDER_SITE_OTHER): Payer: 59 | Admitting: Family Medicine

## 2015-02-24 ENCOUNTER — Encounter: Payer: Self-pay | Admitting: Family Medicine

## 2015-02-24 VITALS — BP 112/64 | HR 84 | Wt 217.0 lb

## 2015-02-24 DIAGNOSIS — Z720 Tobacco use: Secondary | ICD-10-CM | POA: Diagnosis not present

## 2015-02-24 DIAGNOSIS — F17200 Nicotine dependence, unspecified, uncomplicated: Secondary | ICD-10-CM

## 2015-02-24 DIAGNOSIS — K140 Glossitis: Secondary | ICD-10-CM | POA: Diagnosis not present

## 2015-02-24 NOTE — Patient Instructions (Signed)
Gargle twice a day with half water and peroxide and then spit it out. You need to quit smoking

## 2015-02-24 NOTE — Progress Notes (Signed)
   Subjective:    Patient ID: Christine Shah, female    DOB: 02-14-1966, 49 y.o.   MRN: [id number]  HPI She complains of a several day history of soreness on the roof of her mouth as well as fissuring of her tongue and slight discomfort. She does smoke.   Review of Systems     Objective:   Physical Exam Fissuring is noted of the tongue however there is no tenderness to palpation or lesions seen. Oral exam shows her to be edentulous but no lesions are noted on the hard palate.       Assessment & Plan:  Glossitis  Tobacco use disorder Recommend she quit smoking however she is not ready. Recommend half water and peroxide.This is most likely glossitis although no improvement further evaluation will be needed.

## 2015-02-25 ENCOUNTER — Telehealth: Payer: Self-pay | Admitting: Internal Medicine

## 2015-02-25 NOTE — Telephone Encounter (Signed)
Talked to patient and advised her to continue current treatment and if not better over the weekend to call back Monday to be seen again

## 2015-02-25 NOTE — Telephone Encounter (Signed)
Sometimes tongue coated white can be a sign of thrush, but he should have seen evidence of that on exam yesterday (white patches on her mucus membranes); since he didn't, continue with current treatment, since swelling has improved

## 2015-02-25 NOTE — Telephone Encounter (Signed)
Pt called stating that Dr. Redmond School told her, she had Glossitis yesterday and to use ibuprofen and rinse mouth out 3 times a day and use cold water to help with swelling. Pt's swelling has gone down some but her tongue is coated white. Is there anything that you can do for the patient as far as sending something in or giving her more advice. Her pharmacy is [facility].

## 2015-03-03 ENCOUNTER — Telehealth: Payer: Self-pay | Admitting: Family Medicine

## 2015-03-03 NOTE — Telephone Encounter (Signed)
Tongue is no better, what should she do now.  Please call pt [phone]

## 2015-03-04 ENCOUNTER — Encounter: Payer: Self-pay | Admitting: Family Medicine

## 2015-03-04 ENCOUNTER — Ambulatory Visit (INDEPENDENT_AMBULATORY_CARE_PROVIDER_SITE_OTHER): Payer: 59 | Admitting: Family Medicine

## 2015-03-04 VITALS — BP 112/70 | HR 80 | Temp 98.4°F | Wt 221.0 lb

## 2015-03-04 DIAGNOSIS — K146 Glossodynia: Secondary | ICD-10-CM

## 2015-03-04 NOTE — Progress Notes (Signed)
   Subjective:    Patient ID: Christine Shah, female    DOB: 12/14/1965, 49 y.o.   MRN: [id number]  HPI She is here for a reevaluation. She now states that she is having some left-sided tongue discomfort as well as left submandibular pain that radiates into the throat area. The tongue pain is made worse with eating. She also complains of slight dizziness but states that she has had difficulty with this in the past. There's been no blurred vision, double vision, nausea vomiting or balance issues.   Review of Systems     Objective:   Physical Exam Alert and in no distress. Tympanic membranes and canals are normal. Pharyngeal area is normal. Neck is supple without adenopathy or thyromegaly. Cardiac exam shows a regular sinus rhythm without murmurs or gallops. Lungs are clear to auscultation. Exam of the tongue shows no visible or palpable lesions.       Assessment & Plan:  Tongue pain - Plan: Ambulatory referral to ENT

## 2015-03-09 ENCOUNTER — Telehealth: Payer: Self-pay | Admitting: Family Medicine

## 2015-03-09 ENCOUNTER — Encounter (HOSPITAL_COMMUNITY): Payer: Self-pay

## 2015-03-09 ENCOUNTER — Emergency Department (HOSPITAL_COMMUNITY)
Admission: EM | Admit: 2015-03-09 | Discharge: 2015-03-09 | Disposition: A | Discharge status: Home / Self Care | Payer: 59 | Attending: Emergency Medicine | Admitting: Emergency Medicine

## 2015-03-09 DIAGNOSIS — Z8719 Personal history of other diseases of the digestive system: Secondary | ICD-10-CM | POA: Diagnosis not present

## 2015-03-09 DIAGNOSIS — R6889 Other general symptoms and signs: Secondary | ICD-10-CM

## 2015-03-09 DIAGNOSIS — Z79899 Other long term (current) drug therapy: Secondary | ICD-10-CM | POA: Diagnosis not present

## 2015-03-09 DIAGNOSIS — J029 Acute pharyngitis, unspecified: Secondary | ICD-10-CM | POA: Diagnosis present

## 2015-03-09 DIAGNOSIS — G8929 Other chronic pain: Secondary | ICD-10-CM | POA: Diagnosis not present

## 2015-03-09 DIAGNOSIS — Z720 Tobacco use: Secondary | ICD-10-CM | POA: Insufficient documentation

## 2015-03-09 DIAGNOSIS — J392 Other diseases of pharynx: Secondary | ICD-10-CM | POA: Diagnosis not present

## 2015-03-09 DIAGNOSIS — Z86010 Personal history of colonic polyps: Secondary | ICD-10-CM | POA: Insufficient documentation

## 2015-03-09 MED ORDER — DIPHENHYDRAMINE HCL 25 MG PO CAPS
50.0000 mg | ORAL_CAPSULE | Freq: Once | ORAL | Status: AC
  Administered 2015-03-09: 50 mg via ORAL
  Filled 2015-03-09: qty 2

## 2015-03-09 MED ORDER — DEXAMETHASONE 4 MG PO TABS
12.0000 mg | ORAL_TABLET | Freq: Once | ORAL | Status: AC
  Administered 2015-03-09: 12 mg via ORAL
  Filled 2015-03-09: qty 3

## 2015-03-09 NOTE — Telephone Encounter (Signed)
Have her come in and that see if any has changed. I might need to get a scan but I do evaluate her first for insurance purposes

## 2015-03-09 NOTE — Telephone Encounter (Signed)
Left message for pt has appointment 02/06/15

## 2015-03-09 NOTE — Telephone Encounter (Signed)
Pt called crying, she went to ER with SOB they told her there was nothing they could do.  She went to ENT yesterday Dr. Redmond Baseman and he offered her mouthwash for the sores on her tongue, but did not talk to her about th eswelling.  She said her tongue started swelling today at work and she began having difficulty breathing, she states she does have allergies and she did use her inhaler twice.  Her oxygen level was fine at the hospital.  She said the ENT did not even look into her ears and she is having sharp pain in her neck.  She wants to know what to do.  Please advise pt at [phone]

## 2015-03-09 NOTE — ED Notes (Signed)
Pt has been seen by MD 3 times for throat pain, swelling.  Pt has seen ENT also with no dx.  Pt has had sore throat with swelling x 3 weeks. Pt able to speak in full sentences.  Pt having no difficulty maintaining airway
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
## 2015-03-09 NOTE — ED Notes (Signed)
Called for patient x2. No answer.

## 2015-03-10 ENCOUNTER — Ambulatory Visit (INDEPENDENT_AMBULATORY_CARE_PROVIDER_SITE_OTHER): Payer: 59 | Admitting: Family Medicine

## 2015-03-10 VITALS — BP 114/76 | HR 78 | Temp 98.2°F

## 2015-03-10 DIAGNOSIS — R51 Headache: Secondary | ICD-10-CM

## 2015-03-10 DIAGNOSIS — R1310 Dysphagia, unspecified: Secondary | ICD-10-CM

## 2015-03-10 DIAGNOSIS — H9203 Otalgia, bilateral: Secondary | ICD-10-CM

## 2015-03-10 DIAGNOSIS — K146 Glossodynia: Secondary | ICD-10-CM | POA: Diagnosis not present

## 2015-03-10 DIAGNOSIS — R519 Headache, unspecified: Secondary | ICD-10-CM

## 2015-03-10 NOTE — Progress Notes (Signed)
   Subjective:    Patient ID: Christine Shah, female    DOB: 1966/10/28, 49 y.o.   MRN: [id number]  HPI She is here for recheck.She continues to complain of tongue pain and recent difficulty with swelling as well. She was seen in the emergency room yesterday. The ER record was reviewed and essentially no pathology could be found. She also complains of dizziness, bilateral ear pain, headache, nausea, upper abdominal cramping and pain, leg weakness She had difficulty swallowing while in the emergency room. She apparently did get one or 2 pills down but then was unable to swallow solids but can swallow liquids. She was given Benadryl and Decadron in the ER but unable to continue the dosing regimen. She was seen recently by ENT. That note was also reviewed.  Review of Systems     Objective:   Physical Exam Alert and in no distress. Tympanic membranes show the malleolar to be erythematous and the tympanic membrane to be slightly dull  canals are normal. Pharyngeal area is normal. Exam of the tongue shows no palpable or visible lesions.Neck is supple without adenopathy or thyromegaly. Cardiac exam shows a regular sinus rhythm without murmurs or gallops. Lungs are clear to auscultation.        Assessment & Plan:  Tongue pain - Plan: CT Soft Tissue Neck W Contrast  Swallowing difficulty - Plan: CT Soft Tissue Neck W Contrast  Nonintractable headache, unspecified chronicity pattern, unspecified headache type - Plan: CT Soft Tissue Neck W Contrast  Ear pain, bilateral - Plan: CT Soft Tissue Neck W Contrast Difficult situation to police S. I will get a CT scan to make sure there is no anatomic issues. If this is negative I will treat as if she has otitis media Although the malleolar are erythematous, they're both equal in the amount of erythema which makes me less concerned

## 2015-03-12 ENCOUNTER — Ambulatory Visit
Admission: RE | Admit: 2015-03-12 | Discharge: 2015-03-12 | Disposition: A | Discharge status: Home / Self Care | Payer: 59 | Source: Ambulatory Visit | Attending: Family Medicine | Admitting: Family Medicine

## 2015-03-12 DIAGNOSIS — R519 Headache, unspecified: Secondary | ICD-10-CM

## 2015-03-12 DIAGNOSIS — K146 Glossodynia: Secondary | ICD-10-CM

## 2015-03-12 DIAGNOSIS — R1310 Dysphagia, unspecified: Secondary | ICD-10-CM

## 2015-03-12 DIAGNOSIS — H9203 Otalgia, bilateral: Secondary | ICD-10-CM

## 2015-03-12 DIAGNOSIS — R51 Headache: Secondary | ICD-10-CM

## 2015-03-12 MED ORDER — IOPAMIDOL (ISOVUE-300) INJECTION 61%
75.0000 mL | Freq: Once | INTRAVENOUS | Status: AC | PRN
  Administered 2015-03-12: 75 mL via INTRAVENOUS

## 2015-03-15 ENCOUNTER — Other Ambulatory Visit: Payer: Self-pay

## 2015-03-15 ENCOUNTER — Telehealth: Payer: Self-pay

## 2015-03-15 DIAGNOSIS — R221 Localized swelling, mass and lump, neck: Secondary | ICD-10-CM

## 2015-03-15 MED ORDER — ALPRAZOLAM 0.25 MG PO TABS: 0.2500 mg | ORAL_TABLET | Freq: Three times a day (TID) | ORAL | Status: DC | PRN

## 2015-03-15 NOTE — Telephone Encounter (Signed)
Patient called and asked if you could please refill her xanax due to all of her anxiety please advise

## 2015-03-15 NOTE — Telephone Encounter (Signed)
Okay to refill? 

## 2015-03-15 NOTE — Telephone Encounter (Signed)
done

## 2015-03-16 ENCOUNTER — Ambulatory Visit (HOSPITAL_COMMUNITY)
Admission: RE | Admit: 2015-03-16 | Discharge: 2015-03-16 | Disposition: A | Discharge status: Home / Self Care | Payer: 59 | Source: Ambulatory Visit | Attending: Family Medicine | Admitting: Family Medicine

## 2015-03-16 DIAGNOSIS — R221 Localized swelling, mass and lump, neck: Secondary | ICD-10-CM | POA: Diagnosis not present

## 2015-03-18 ENCOUNTER — Telehealth: Payer: Self-pay

## 2015-03-18 ENCOUNTER — Other Ambulatory Visit: Payer: Self-pay

## 2015-03-18 DIAGNOSIS — R221 Localized swelling, mass and lump, neck: Secondary | ICD-10-CM

## 2015-03-18 NOTE — Telephone Encounter (Signed)
Called [facility] they said they would send in to have review when it is return to scheduling they will call pt and schedule appointment

## 2015-03-22 ENCOUNTER — Ambulatory Visit: Payer: 59 | Admitting: Medical

## 2015-03-22 ENCOUNTER — Telehealth: Payer: Self-pay | Admitting: Internal Medicine

## 2015-03-22 NOTE — Telephone Encounter (Signed)
Pt has an appt for Thyroid biposy tomorrow at [address] @ 3:10pm. Pt is aware of this appt. Pt states that she is just tired and weak and also states she is having SOB and chest pains but thinks that its coming from her anxiety and I told her that if she felt she needed to see shane for reassurance then she could and she said no and said she would just go home. She did ask me if her thyroid issues would make her tired and i did say it can. But she is aware of her biposy tomorrow

## 2015-03-23 ENCOUNTER — Ambulatory Visit
Admission: RE | Admit: 2015-03-23 | Discharge: 2015-03-23 | Disposition: A | Discharge status: Home / Self Care | Payer: 59 | Source: Ambulatory Visit | Attending: Family Medicine | Admitting: Family Medicine

## 2015-03-23 ENCOUNTER — Other Ambulatory Visit (HOSPITAL_COMMUNITY)
Admission: RE | Admit: 2015-03-23 | Discharge: 2015-03-23 | Disposition: A | Discharge status: Home / Self Care | Payer: 59 | Source: Ambulatory Visit | Attending: Interventional Radiology | Admitting: Interventional Radiology

## 2015-03-23 DIAGNOSIS — E041 Nontoxic single thyroid nodule: Secondary | ICD-10-CM | POA: Diagnosis not present

## 2015-03-23 DIAGNOSIS — R221 Localized swelling, mass and lump, neck: Secondary | ICD-10-CM

## 2015-03-24 ENCOUNTER — Telehealth: Payer: Self-pay | Admitting: Family Medicine

## 2015-03-24 NOTE — Telephone Encounter (Signed)
Pt requesting that records be faxed to [facility] @ [phone] so that she can make an appt there and be seen. Call pt when records have been faxed. Did we refer pt to Endo? Is this ok?

## 2015-03-24 NOTE — Telephone Encounter (Signed)
She had seen Dr. Redmond School, who sent her for u/s.  They found a nodule, and she had biopsy yesterday. Looks like pathology was benign.  I don't know that she truly needs to see endocrinologist--I will leave that up to Dr. Redmond School Benign thyroid nodule. Last TSH was 07/2013

## 2015-03-25 NOTE — Telephone Encounter (Signed)
She really does not need to see an endocrinologist. She needs to be scheduled with surgery preferably [facility]

## 2015-03-25 NOTE — Telephone Encounter (Signed)
Pt notified she does not need to see endo. I have sent everything to [facility] for referral for thyroid nodule. When i get the appt info back, [facility] insurance can be done

## 2015-03-26 ENCOUNTER — Ambulatory Visit (INDEPENDENT_AMBULATORY_CARE_PROVIDER_SITE_OTHER): Payer: 59 | Admitting: Family Medicine

## 2015-03-26 ENCOUNTER — Encounter: Payer: Self-pay | Admitting: Family Medicine

## 2015-03-26 VITALS — BP 110/74 | HR 76 | Ht 65.0 in | Wt 212.0 lb

## 2015-03-26 DIAGNOSIS — R1010 Upper abdominal pain, unspecified: Secondary | ICD-10-CM

## 2015-03-26 DIAGNOSIS — K219 Gastro-esophageal reflux disease without esophagitis: Secondary | ICD-10-CM

## 2015-03-26 DIAGNOSIS — Z8669 Personal history of other diseases of the nervous system and sense organs: Secondary | ICD-10-CM | POA: Diagnosis not present

## 2015-03-26 DIAGNOSIS — R51 Headache: Secondary | ICD-10-CM

## 2015-03-26 DIAGNOSIS — R519 Headache, unspecified: Secondary | ICD-10-CM

## 2015-03-26 NOTE — Progress Notes (Signed)
   Subjective:    Patient ID: Christine Shah, female    DOB: 11-30-1966, 49 y.o.   MRN: [id number]  HPI She is here for consult concerning continued difficulty with multiple issues. She was originally being evaluated for throat pain. A thyroid nodule which was found to be benign was found but she continues to complain of some throat discomfort. She also has several other complaints. It was very difficult to get a coherent history from her but essentially she complains of being dizzy since 2014. She then described a foggy/blurred sensation to her eyes because her to become dizzy followed by a headache it ends up in the occipital area that can last all day. She has tried ibuprofen 4 pills twice per day without benefit. She then mentioned that she did have a history of migraine headache but states that this headache is a Haematologist. She states she was given occipital injections to help with the headache. She then on to describe a three-week history of chest pain that is sharp and in the mid chest area with some shortness of breath. It can be 3 or 4 times per day. She then states that when she eats approximately 20 minutes later she will have left upper quadrant pain followed by chest pain, shortness breath and asked if she had any other symptoms she responded no but then further questioning indicates she was having difficulty with gas and increased flatulence but no vomiting or diarrhea.she does have a previous history of reflux disease.   Review of Systems     Objective:   Physical Exam Alert and in no distress. Tympanic membranes and canals are normal. Pharyngeal area is normal. Neck is supple without adenopathy or thyromegaly. Cardiac exam shows a regular sinus rhythm without murmurs or gallops. Lungs are clear to auscultation.abdominal exam shows decreased bowel sounds without hepatosplenomegaly. Murphy's sign and Murphy's punch negative.        Assessment & Plan:  Nonintractable  headache, unspecified chronicity pattern, unspecified headache type - Plan: Ambulatory referral to Neurology  History of migraine - Plan: Ambulatory referral to Neurology  Pain of upper abdomen - Plan: US Abdomen Limited RUQ, CANCELED: US Abdomen Limited  Gastroesophageal reflux disease without esophagitis it was very difficult to get a coherent history from her without quizzing her extensively even after she said she had no symptoms, more were elicited. I will rule out gallbladder disease. Also refer back to neurology for the headaches.

## 2015-03-29 ENCOUNTER — Telehealth: Payer: Self-pay | Admitting: Internal Medicine

## 2015-03-29 ENCOUNTER — Emergency Department (HOSPITAL_COMMUNITY): Payer: 59

## 2015-03-29 ENCOUNTER — Emergency Department (HOSPITAL_COMMUNITY)
Admission: EM | Admit: 2015-03-29 | Discharge: 2015-03-29 | Disposition: A | Discharge status: Home / Self Care | Payer: 59 | Attending: Emergency Medicine | Admitting: Emergency Medicine

## 2015-03-29 ENCOUNTER — Encounter (HOSPITAL_COMMUNITY): Payer: Self-pay | Admitting: Emergency Medicine

## 2015-03-29 ENCOUNTER — Telehealth: Payer: Self-pay | Admitting: Neurology

## 2015-03-29 DIAGNOSIS — Z720 Tobacco use: Secondary | ICD-10-CM | POA: Diagnosis not present

## 2015-03-29 DIAGNOSIS — Z8719 Personal history of other diseases of the digestive system: Secondary | ICD-10-CM | POA: Insufficient documentation

## 2015-03-29 DIAGNOSIS — Z8709 Personal history of other diseases of the respiratory system: Secondary | ICD-10-CM | POA: Insufficient documentation

## 2015-03-29 DIAGNOSIS — F419 Anxiety disorder, unspecified: Secondary | ICD-10-CM | POA: Insufficient documentation

## 2015-03-29 DIAGNOSIS — Z86010 Personal history of colonic polyps: Secondary | ICD-10-CM | POA: Insufficient documentation

## 2015-03-29 DIAGNOSIS — Z8739 Personal history of other diseases of the musculoskeletal system and connective tissue: Secondary | ICD-10-CM | POA: Insufficient documentation

## 2015-03-29 DIAGNOSIS — R0602 Shortness of breath: Secondary | ICD-10-CM | POA: Insufficient documentation

## 2015-03-29 DIAGNOSIS — Z8679 Personal history of other diseases of the circulatory system: Secondary | ICD-10-CM | POA: Diagnosis not present

## 2015-03-29 LAB — BASIC METABOLIC PANEL
Anion gap: 8 (ref 5–15)
BUN: 8 mg/dL (ref 6–23)
CALCIUM: 8.7 mg/dL (ref 8.4–10.5)
CO2: 23 mmol/L (ref 19–32)
Chloride: 107 mmol/L (ref 96–112)
Creatinine, Ser: 0.62 mg/dL (ref 0.50–1.10)
GFR calc Af Amer: >90 mL/min (ref >90)
GFR calc non Af Amer: >90 mL/min (ref >90)
Glucose, Bld: 106 mg/dL — ABNORMAL HIGH (ref 70–99)
Potassium: 3.9 mmol/L (ref 3.5–5.1)
SODIUM: 138 mmol/L (ref 135–145)

## 2015-03-29 LAB — CBC
HCT: 43.3 % (ref 36.0–46.0)
Hemoglobin: 14.9 g/dL (ref 12.0–15.0)
MCH: 31.3 pg (ref 26.0–34.0)
MCHC: 34.4 g/dL (ref 30.0–36.0)
MCV: 91 fL (ref 78.0–100.0)
PLATELETS: 198 10*3/uL (ref 150–400)
RBC: 4.76 MIL/uL (ref 3.87–5.11)
RDW: 12.7 % (ref 11.5–15.5)
WBC: 6.6 10*3/uL (ref 4.0–10.5)

## 2015-03-29 LAB — I-STAT TROPONIN, ED: TROPONIN I, POC: 0 ng/mL (ref 0.00–0.08)

## 2015-03-29 LAB — BRAIN NATRIURETIC PEPTIDE: B Natriuretic Peptide: 16.5 pg/mL (ref 0.0–100.0)

## 2015-03-29 MED ORDER — METHYLPREDNISOLONE SODIUM SUCC 125 MG IJ SOLR
125.0000 mg | Freq: Once | INTRAMUSCULAR | Status: AC
  Administered 2015-03-29: 125 mg via INTRAMUSCULAR

## 2015-03-29 MED ORDER — ALBUTEROL SULFATE HFA 108 (90 BASE) MCG/ACT IN AERS: 2.0000 | INHALATION_SPRAY | RESPIRATORY_TRACT | Status: DC | PRN

## 2015-03-29 MED ORDER — IPRATROPIUM-ALBUTEROL 0.5-2.5 (3) MG/3ML IN SOLN
3.0000 mL | Freq: Once | RESPIRATORY_TRACT | Status: AC
  Administered 2015-03-29: 3 mL via RESPIRATORY_TRACT
  Filled 2015-03-29: qty 3

## 2015-03-29 MED ORDER — METHYLPREDNISOLONE SODIUM SUCC 125 MG IJ SOLR
125.0000 mg | Freq: Once | INTRAMUSCULAR | Status: DC
  Filled 2015-03-29: qty 2

## 2015-03-29 NOTE — Telephone Encounter (Signed)
Spoke to pt and told her what to do depending if she was admitted or not

## 2015-03-29 NOTE — ED Notes (Signed)
Per pt, states trouble breathing for 5 days, states getting worse

## 2015-03-29 NOTE — ED Notes (Signed)
Pt alert x4 respirations easy non labored. Skin w/d

## 2015-03-29 NOTE — Telephone Encounter (Signed)
Pt called just a little bit ago and left a message stating she has an ultrasound tomorrow and does she still need to go. When i looked at test for ultrasound it says due to abdominal pain and bloating. She is currently at ER for trouble breathing. Please advise

## 2015-03-29 NOTE — Telephone Encounter (Signed)
A lot depends on what happens with her today. If they admit her then she doesn't need to go but if she doesn't get admitted then have her keep the appointment
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
## 2015-03-29 NOTE — Discharge Instructions (Signed)

## 2015-03-29 NOTE — Telephone Encounter (Signed)
Dr Redmond School office called and canceled new patient appt to due to referral sent to [place] by mistake

## 2015-03-30 ENCOUNTER — Ambulatory Visit
Admission: RE | Admit: 2015-03-30 | Discharge: 2015-03-30 | Disposition: A | Discharge status: Home / Self Care | Payer: 59 | Source: Ambulatory Visit | Attending: Family Medicine | Admitting: Family Medicine

## 2015-03-30 DIAGNOSIS — R1010 Upper abdominal pain, unspecified: Secondary | ICD-10-CM

## 2015-04-15 ENCOUNTER — Telehealth: Payer: Self-pay | Admitting: *Deleted

## 2015-04-15 NOTE — Telephone Encounter (Signed)
Patient called and states that she is having a lot of fatigue, her cycle is abnormal and she has dry itchy skin. Wants to know if you would order TSH on her for lab visit only-last TSH 07/2013. Please advise. Thanks.

## 2015-04-15 NOTE — Telephone Encounter (Signed)
Pt coming in monday

## 2015-04-15 NOTE — Telephone Encounter (Signed)
Explained to her that there are multiple reasons for the symptoms that she is having and thyroid is only one of them. I would want to examine her and order appropriate tests so just ordering a TSH would not be appropriate

## 2015-04-16 ENCOUNTER — Other Ambulatory Visit: Payer: Self-pay | Admitting: Surgery

## 2015-04-16 DIAGNOSIS — E041 Nontoxic single thyroid nodule: Secondary | ICD-10-CM

## 2015-04-17 ENCOUNTER — Encounter (HOSPITAL_COMMUNITY): Payer: Self-pay | Admitting: Emergency Medicine

## 2015-04-17 ENCOUNTER — Emergency Department (HOSPITAL_COMMUNITY)
Admission: EM | Admit: 2015-04-17 | Discharge: 2015-04-17 | Disposition: A | Discharge status: Home / Self Care | Payer: 59 | Attending: Emergency Medicine | Admitting: Emergency Medicine

## 2015-04-17 DIAGNOSIS — Z86010 Personal history of colonic polyps: Secondary | ICD-10-CM | POA: Insufficient documentation

## 2015-04-17 DIAGNOSIS — Z3202 Encounter for pregnancy test, result negative: Secondary | ICD-10-CM | POA: Diagnosis not present

## 2015-04-17 DIAGNOSIS — F419 Anxiety disorder, unspecified: Secondary | ICD-10-CM | POA: Insufficient documentation

## 2015-04-17 DIAGNOSIS — K219 Gastro-esophageal reflux disease without esophagitis: Secondary | ICD-10-CM | POA: Diagnosis not present

## 2015-04-17 DIAGNOSIS — M5430 Sciatica, unspecified side: Secondary | ICD-10-CM | POA: Insufficient documentation

## 2015-04-17 DIAGNOSIS — R1012 Left upper quadrant pain: Secondary | ICD-10-CM

## 2015-04-17 DIAGNOSIS — Z720 Tobacco use: Secondary | ICD-10-CM | POA: Insufficient documentation

## 2015-04-17 DIAGNOSIS — Z8679 Personal history of other diseases of the circulatory system: Secondary | ICD-10-CM | POA: Insufficient documentation

## 2015-04-17 DIAGNOSIS — K2970 Gastritis, unspecified, without bleeding: Secondary | ICD-10-CM | POA: Insufficient documentation

## 2015-04-17 LAB — URINALYSIS, ROUTINE W REFLEX MICROSCOPIC
Bilirubin Urine: NEGATIVE
GLUCOSE, UA: NEGATIVE mg/dL
Hgb urine dipstick: NEGATIVE
KETONES UR: NEGATIVE mg/dL
Leukocytes, UA: NEGATIVE
NITRITE: NEGATIVE
PROTEIN: NEGATIVE mg/dL
SPECIFIC GRAVITY, URINE: 1.015 (ref 1.005–1.030)
Urobilinogen, UA: 1 mg/dL (ref 0.0–1.0)
pH: 6.5 (ref 5.0–8.0)

## 2015-04-17 LAB — CBC WITH DIFFERENTIAL/PLATELET
Basophils Absolute: 0.1 10*3/uL (ref 0.0–0.1)
Basophils Relative: 1 % (ref 0–1)
EOS PCT: 4 % (ref 0–5)
Eosinophils Absolute: 0.2 10*3/uL (ref 0.0–0.7)
HCT: 42.1 % (ref 36.0–46.0)
Hemoglobin: 14.1 g/dL (ref 12.0–15.0)
Lymphocytes Relative: 25 % (ref 12–46)
Lymphs Abs: 1.5 10*3/uL (ref 0.7–4.0)
MCH: 30.4 pg (ref 26.0–34.0)
MCHC: 33.5 g/dL (ref 30.0–36.0)
MCV: 90.7 fL (ref 78.0–100.0)
MONO ABS: 0.4 10*3/uL (ref 0.1–1.0)
Monocytes Relative: 6 % (ref 3–12)
Neutro Abs: 3.9 10*3/uL (ref 1.7–7.7)
Neutrophils Relative %: 64 % (ref 43–77)
PLATELETS: 202 10*3/uL (ref 150–400)
RBC: 4.64 MIL/uL (ref 3.87–5.11)
RDW: 12.8 % (ref 11.5–15.5)
WBC: 6.1 10*3/uL (ref 4.0–10.5)

## 2015-04-17 LAB — COMPREHENSIVE METABOLIC PANEL
ALBUMIN: 4 g/dL (ref 3.5–5.0)
ALT: 19 U/L (ref 14–54)
ANION GAP: 4 — AB (ref 5–15)
AST: 20 U/L (ref 15–41)
Alkaline Phosphatase: 53 U/L (ref 38–126)
BILIRUBIN TOTAL: 0.9 mg/dL (ref 0.3–1.2)
BUN: 8 mg/dL (ref 6–20)
CALCIUM: 8.8 mg/dL — AB (ref 8.9–10.3)
CHLORIDE: 113 mmol/L — AB (ref 101–111)
CO2: 24 mmol/L (ref 22–32)
CREATININE: 0.59 mg/dL (ref 0.44–1.00)
Glucose, Bld: 105 mg/dL — ABNORMAL HIGH (ref 65–99)
POTASSIUM: 3.8 mmol/L (ref 3.5–5.1)
SODIUM: 141 mmol/L (ref 135–145)
Total Protein: 6.5 g/dL (ref 6.5–8.1)

## 2015-04-17 LAB — LIPASE, BLOOD: Lipase: 21 U/L — ABNORMAL LOW (ref 22–51)

## 2015-04-17 LAB — PREGNANCY, URINE: Preg Test, Ur: NEGATIVE

## 2015-04-17 MED ORDER — METOCLOPRAMIDE HCL 5 MG/ML IJ SOLN
10.0000 mg | INTRAMUSCULAR | Status: AC
  Administered 2015-04-17: 10 mg via INTRAVENOUS
  Filled 2015-04-17: qty 2

## 2015-04-17 MED ORDER — KETOROLAC TROMETHAMINE 30 MG/ML IJ SOLN
30.0000 mg | Freq: Once | INTRAMUSCULAR | Status: AC
  Administered 2015-04-17: 30 mg via INTRAVENOUS
  Filled 2015-04-17: qty 1

## 2015-04-17 MED ORDER — PANTOPRAZOLE SODIUM 20 MG PO TBEC: 20.0000 mg | DELAYED_RELEASE_TABLET | Freq: Every day | ORAL | Status: DC

## 2015-04-17 MED ORDER — SUCRALFATE 1 GM/10ML PO SUSP: 1.0000 g | Freq: Three times a day (TID) | ORAL | Status: DC

## 2015-04-17 MED ORDER — PANTOPRAZOLE SODIUM 40 MG IV SOLR
40.0000 mg | INTRAVENOUS | Status: AC
  Administered 2015-04-17: 40 mg via INTRAVENOUS
  Filled 2015-04-17: qty 40
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
## 2015-04-17 NOTE — ED Notes (Signed)
Pt from home c/o generalized abdominal pain and nausea since 2300. She reports taking naproxen for costochondritis. She is also reporting bilateral ankle edema (not seen upon assessment)  And red bump to right lower leg. Denies shortness of breath.

## 2015-04-17 NOTE — Discharge Instructions (Signed)

## 2015-04-19 ENCOUNTER — Institutional Professional Consult (permissible substitution): Payer: 59 | Admitting: Family Medicine

## 2015-04-22 ENCOUNTER — Ambulatory Visit: Payer: 59 | Admitting: Internal Medicine

## 2015-04-23 ENCOUNTER — Encounter: Payer: Self-pay | Admitting: Family Medicine

## 2015-05-04 ENCOUNTER — Ambulatory Visit: Payer: 59 | Admitting: Neurology

## 2015-05-20 ENCOUNTER — Emergency Department (HOSPITAL_COMMUNITY)
Admission: EM | Admit: 2015-05-20 | Discharge: 2015-05-20 | Disposition: A | Discharge status: Home / Self Care | Payer: 59 | Attending: Emergency Medicine | Admitting: Emergency Medicine

## 2015-05-20 ENCOUNTER — Encounter (HOSPITAL_COMMUNITY): Payer: Self-pay | Admitting: Physical Medicine and Rehabilitation

## 2015-05-20 DIAGNOSIS — Z8739 Personal history of other diseases of the musculoskeletal system and connective tissue: Secondary | ICD-10-CM | POA: Diagnosis not present

## 2015-05-20 DIAGNOSIS — F419 Anxiety disorder, unspecified: Secondary | ICD-10-CM | POA: Diagnosis not present

## 2015-05-20 DIAGNOSIS — K219 Gastro-esophageal reflux disease without esophagitis: Secondary | ICD-10-CM | POA: Diagnosis not present

## 2015-05-20 DIAGNOSIS — Z8679 Personal history of other diseases of the circulatory system: Secondary | ICD-10-CM | POA: Insufficient documentation

## 2015-05-20 DIAGNOSIS — Z86010 Personal history of colonic polyps: Secondary | ICD-10-CM | POA: Insufficient documentation

## 2015-05-20 DIAGNOSIS — R1012 Left upper quadrant pain: Secondary | ICD-10-CM | POA: Diagnosis present

## 2015-05-20 DIAGNOSIS — Z720 Tobacco use: Secondary | ICD-10-CM | POA: Diagnosis not present

## 2015-05-20 DIAGNOSIS — G8929 Other chronic pain: Secondary | ICD-10-CM | POA: Diagnosis not present

## 2015-05-20 LAB — CBC WITH DIFFERENTIAL/PLATELET
BASOS ABS: 0.1 10*3/uL (ref 0.0–0.1)
Basophils Relative: 1 % (ref 0–1)
EOS ABS: 0.3 10*3/uL (ref 0.0–0.7)
EOS PCT: 5 % (ref 0–5)
HCT: 41.2 % (ref 36.0–46.0)
Hemoglobin: 14.4 g/dL (ref 12.0–15.0)
Lymphocytes Relative: 24 % (ref 12–46)
Lymphs Abs: 1.5 10*3/uL (ref 0.7–4.0)
MCH: 31.2 pg (ref 26.0–34.0)
MCHC: 35 g/dL (ref 30.0–36.0)
MCV: 89.2 fL (ref 78.0–100.0)
MONO ABS: 0.4 10*3/uL (ref 0.1–1.0)
Monocytes Relative: 6 % (ref 3–12)
NEUTROS ABS: 3.9 10*3/uL (ref 1.7–7.7)
Neutrophils Relative %: 64 % (ref 43–77)
Platelets: 196 10*3/uL (ref 150–400)
RBC: 4.62 MIL/uL (ref 3.87–5.11)
RDW: 12.8 % (ref 11.5–15.5)
WBC: 6.1 10*3/uL (ref 4.0–10.5)

## 2015-05-20 LAB — COMPREHENSIVE METABOLIC PANEL
ALBUMIN: 3.8 g/dL (ref 3.5–5.0)
ALK PHOS: 50 U/L (ref 38–126)
ALT: 15 U/L (ref 14–54)
AST: 16 U/L (ref 15–41)
Anion gap: 9 (ref 5–15)
BUN: 7 mg/dL (ref 6–20)
CHLORIDE: 108 mmol/L (ref 101–111)
CO2: 24 mmol/L (ref 22–32)
Calcium: 9 mg/dL (ref 8.9–10.3)
Creatinine, Ser: 0.76 mg/dL (ref 0.44–1.00)
GFR calc Af Amer: >60 mL/min (ref >60)
GFR calc non Af Amer: >60 mL/min (ref >60)
GLUCOSE: 97 mg/dL (ref 65–99)
Potassium: 3.5 mmol/L (ref 3.5–5.1)
Sodium: 141 mmol/L (ref 135–145)
Total Bilirubin: 1.1 mg/dL (ref 0.3–1.2)
Total Protein: 6.2 g/dL — ABNORMAL LOW (ref 6.5–8.1)

## 2015-05-20 LAB — LIPASE, BLOOD: Lipase: 16 U/L — ABNORMAL LOW (ref 22–51)

## 2015-05-20 MED ORDER — SUCRALFATE 1 GM/10ML PO SUSP: 1.0000 g | Freq: Three times a day (TID) | ORAL | Status: DC

## 2015-05-20 MED ORDER — GI COCKTAIL ~~LOC~~
30.0000 mL | Freq: Once | ORAL | Status: AC
  Administered 2015-05-20: 30 mL via ORAL
  Filled 2015-05-20: qty 30

## 2015-05-20 MED ORDER — PANTOPRAZOLE SODIUM 20 MG PO TBEC: 20.0000 mg | DELAYED_RELEASE_TABLET | Freq: Every day | ORAL | Status: DC
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
## 2015-05-20 NOTE — Discharge Instructions (Signed)
Gastroesophageal Reflux Disease, Adult Gastroesophageal reflux disease (GERD) happens when acid from your stomach flows up into the esophagus. When acid comes in contact with the esophagus, the acid causes soreness (inflammation) in the esophagus. Over time, GERD may create small holes (ulcers) in the lining of the esophagus. CAUSES   Increased body weight. This puts pressure on the stomach, making acid rise from the stomach into the esophagus.  Smoking. This increases acid production in the stomach.  Drinking alcohol. This causes decreased pressure in the lower esophageal sphincter (valve or ring of muscle between the esophagus and stomach), allowing acid from the stomach into the esophagus.  Late evening meals and a full stomach. This increases pressure and acid production in the stomach.  A malformed lower esophageal sphincter. Sometimes, no cause is found. SYMPTOMS   Burning pain in the lower part of the mid-chest behind the breastbone and in the mid-stomach area. This may occur twice a week or more often.  Trouble swallowing.  Sore throat.  Dry cough.  Asthma-like symptoms including chest tightness, shortness of breath, or wheezing. DIAGNOSIS  Your caregiver may be able to diagnose GERD based on your symptoms. In some cases, X-rays and other tests may be done to check for complications or to check the condition of your stomach and esophagus. TREATMENT  Your caregiver may recommend over-the-counter or prescription medicines to help decrease acid production. Ask your caregiver before starting or adding any new medicines.  HOME CARE INSTRUCTIONS   Change the factors that you can control. Ask your caregiver for guidance concerning weight loss, quitting smoking, and alcohol consumption.  Avoid foods and drinks that make your symptoms worse, such as:  Caffeine or alcoholic drinks.  Chocolate.  Peppermint or mint flavorings.  Garlic and onions.  Spicy foods.  Citrus fruits,  such as oranges, lemons, or limes.  Tomato-based foods such as sauce, chili, salsa, and pizza.  Fried and fatty foods.  Avoid lying down for the 3 hours prior to your bedtime or prior to taking a nap.  Eat small, frequent meals instead of large meals.  Wear loose-fitting clothing. Do not wear anything tight around your waist that causes pressure on your stomach.  Raise the head of your bed 6 to 8 inches with wood blocks to help you sleep. Extra pillows will not help.  Only take over-the-counter or prescription medicines for pain, discomfort, or fever as directed by your caregiver.  Do not take aspirin, ibuprofen, or other nonsteroidal anti-inflammatory drugs (NSAIDs). SEEK IMMEDIATE MEDICAL CARE IF:   You have pain in your arms, neck, jaw, teeth, or back.  Your pain increases or changes in intensity or duration.  You develop nausea, vomiting, or sweating (diaphoresis).  You develop shortness of breath, or you faint.  Your vomit is green, yellow, black, or looks like coffee grounds or blood.  Your stool is red, bloody, or black. These symptoms could be signs of other problems, such as heart disease, gastric bleeding, or esophageal bleeding. MAKE SURE YOU:   Understand these instructions.  Will watch your condition.  Will get help right away if you are not doing well or get worse. Document Released: 08/30/2005 Document Revised: 02/12/2012 Document Reviewed: 06/09/2011 ExitCare Patient Information 2015 [employer]. This information is not intended to replace advice given to you by your health care provider. Make sure you discuss any questions you have with your health care provider.  

## 2015-05-20 NOTE — ED Notes (Signed)
Pt reports diffuse abdominal pain and nausea. Ongoing x1 day. 8/10 pain upon arrival to ED

## 2015-05-24 ENCOUNTER — Other Ambulatory Visit: Payer: Self-pay

## 2015-05-24 DIAGNOSIS — R51 Headache: Secondary | ICD-10-CM

## 2015-05-24 DIAGNOSIS — R519 Headache, unspecified: Secondary | ICD-10-CM

## 2015-05-24 DIAGNOSIS — K319 Disease of stomach and duodenum, unspecified: Secondary | ICD-10-CM

## 2015-05-25 ENCOUNTER — Encounter: Payer: Self-pay | Admitting: Neurology

## 2015-05-25 ENCOUNTER — Ambulatory Visit (INDEPENDENT_AMBULATORY_CARE_PROVIDER_SITE_OTHER): Payer: 59 | Admitting: Neurology

## 2015-05-25 VITALS — BP 110/70 | HR 80 | Resp 16 | Ht 65.0 in | Wt 207.1 lb

## 2015-05-25 DIAGNOSIS — R208 Other disturbances of skin sensation: Secondary | ICD-10-CM | POA: Diagnosis not present

## 2015-05-25 DIAGNOSIS — Z720 Tobacco use: Secondary | ICD-10-CM

## 2015-05-25 DIAGNOSIS — G44219 Episodic tension-type headache, not intractable: Secondary | ICD-10-CM | POA: Diagnosis not present

## 2015-05-25 HISTORY — DX: Tobacco use: Z72.0

## 2015-05-25 NOTE — Patient Instructions (Addendum)
We will get MRI of brain with and without contrast [place]  06/08/15 4:25 pm  Must have a driver  Refer to physical therapy for the neck Follow up in 3 months. Work on quitting smoking
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
## 2015-06-03 ENCOUNTER — Ambulatory Visit (INDEPENDENT_AMBULATORY_CARE_PROVIDER_SITE_OTHER): Payer: 59 | Admitting: Gastroenterology

## 2015-06-03 ENCOUNTER — Encounter: Payer: Self-pay | Admitting: Gastroenterology

## 2015-06-03 VITALS — BP 122/70 | HR 64 | Ht 64.5 in | Wt 206.2 lb

## 2015-06-03 DIAGNOSIS — R1012 Left upper quadrant pain: Secondary | ICD-10-CM | POA: Diagnosis not present

## 2015-06-03 DIAGNOSIS — K219 Gastro-esophageal reflux disease without esophagitis: Secondary | ICD-10-CM | POA: Diagnosis not present

## 2015-06-03 DIAGNOSIS — R1013 Epigastric pain: Secondary | ICD-10-CM | POA: Diagnosis not present

## 2015-06-03 HISTORY — DX: Gastro-esophageal reflux disease without esophagitis: K21.9

## 2015-06-03 MED ORDER — OMEPRAZOLE 40 MG PO CPDR: 40.0000 mg | DELAYED_RELEASE_CAPSULE | Freq: Two times a day (BID) | ORAL | Status: DC
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
## 2015-06-03 NOTE — Patient Instructions (Addendum)
You have been given a separate informational sheet regarding your tobacco use, the importance of quitting and local resources to help you quit.  We have sent  medications to your pharmacy for you to pick up at your convenience.  Follow up appointment 07/01/2015 at 10:30am

## 2015-06-04 ENCOUNTER — Telehealth: Payer: Self-pay | Admitting: *Deleted

## 2015-06-04 NOTE — Telephone Encounter (Signed)
Patient notified of recommendations. 

## 2015-06-04 NOTE — Telephone Encounter (Signed)
-----   Message from Loralie Champagne, PA-C sent at 06/03/2015  4:50 PM EDT ----- Please let patient know that we found a biopsy report for an EGD in 2011 showing that she had Barrett's esophagus.  We are still waiting for records from Dr. Shepard [facility] office to see exactly when her procedures were performed, however, if 2011 was her last EGD then she will definitely need another procedure to follow-up on the Barrett's.  All of this can be discussed further at her follow-up with me in a few weeks, but I want to make sure that she actually keeps that appointment and does not cancel even if she is feeling better.  Thank you,  Jess

## 2015-06-08 ENCOUNTER — Other Ambulatory Visit: Payer: 59

## 2015-06-08 NOTE — Progress Notes (Signed)
Agree with Ms. Zehr's management.  Danali Marinos E. Myron Lona, MD, FACG  

## 2015-07-01 ENCOUNTER — Ambulatory Visit: Payer: 59 | Admitting: Gastroenterology

## 2015-07-01 ENCOUNTER — Encounter: Payer: Self-pay | Admitting: Nurse Practitioner

## 2015-07-01 ENCOUNTER — Ambulatory Visit (INDEPENDENT_AMBULATORY_CARE_PROVIDER_SITE_OTHER): Payer: Medicaid Other | Admitting: Nurse Practitioner

## 2015-07-01 VITALS — BP 94/60 | HR 80 | Ht 64.5 in | Wt 202.0 lb

## 2015-07-01 DIAGNOSIS — R1013 Epigastric pain: Secondary | ICD-10-CM | POA: Diagnosis not present

## 2015-07-01 DIAGNOSIS — R1012 Left upper quadrant pain: Secondary | ICD-10-CM

## 2015-07-01 NOTE — Progress Notes (Signed)
     History of Present Illness:  Patient is a 49 year old female seen one month ago for epigastric pain / left upper quadrant pain. EGD discussed but patient wanted to hold off as she was in the midst of an insurance change. We increased omeprazole to 40 mg twice daily and recommended follow-up in 3-4 weeks.  Increasing PPI has not made any difference. Patient still has postprandial epigastric pressure and left upper quadrant bloating. She has no associated nausea. Bowel movements are fine. Her weight is down about 15 pounds since March (documented). Patient has been to the emergency department several times this year for these symptoms. LFTs and lipase have been normal. She had a CT scan in February of this year for same symptoms. Gallbladder sludge, possibly gallstones and diverticulosis were seen  Current Medications, Allergies, Past Medical History, Past Surgical History, Family History and Social History were reviewed in [employer] [employer] record.  Physical Exam: General: Pleasant, obese, white female in no acute distress Head: Normocephalic and atraumatic Eyes:  sclerae anicteric, conjunctiva pink  Ears: Normal auditory acuity Lungs: Clear throughout to auscultation Heart: Regular rate and rhythm Abdomen: Soft, non distended, non-tender. No masses, no hepatomegaly. Normal bowel sounds Musculoskeletal: Symmetrical with no gross deformities  Extremities: No edema  Neurological: Alert oriented x 4, grossly nonfocal Psychological:  Alert and cooperative. Normal mood and affect  Assessment and Recommendations:  85. 49 year old female with postprandial epigastric pressure and left upper quadrant bloating. Symptoms refractory to twice a day PPI. She does have a history of a large hiatal hernia and erosive gastritis on previous EGDs by Dr. Earlean Shawl. For further evaluation patient will be scheduled for upper endoscopy, her last one was over 4 years ago. Advised to decrease PPI  back to once a day since dosage increase wasn't beneficial.   2. Barrett's esophagus( 2012). On daily PPI. Surveillance at time of upcoming EGD.

## 2015-07-01 NOTE — Patient Instructions (Signed)

## 2015-07-08 NOTE — Progress Notes (Signed)
Agree with Ms. Guenther's assessment and plan. Zayyan Mullen E. Yanil Dawe, MD, FACG   

## 2015-07-21 ENCOUNTER — Telehealth: Payer: Self-pay | Admitting: *Deleted

## 2015-07-21 MED ORDER — ALPRAZOLAM 0.25 MG PO TABS: 0.2500 mg | ORAL_TABLET | ORAL | Status: DC

## 2015-07-21 NOTE — Telephone Encounter (Signed)
Phoned in.

## 2015-07-21 NOTE — Telephone Encounter (Signed)
If she has a doctor recommending a specific strength/dose, they should be writing the prescription for her (they were giving recommendations for something they probably thought she already had at home).  If they were referring to what is on her med list, 0.25mg , and thought she had some and doesn't, I would do as they suggested--rx 4 tablets of the 0.25 and she can follow their directions.

## 2015-07-21 NOTE — Telephone Encounter (Signed)
I'm okay with prescribing #2 (vs her asking her ortho)

## 2015-07-21 NOTE — Telephone Encounter (Signed)
Called patient back to let her know that I am able to call in #2 of the xanax. She asked which strength? I was unsure-she states that neuro told her to take 4 of the tablets at once 30 minutes prior to scan(.25 on her med list, they thought she still had some left which she does not). Should I just call in #1 1mg ? #2 of the .5mg ? Let me know, thanks.

## 2015-07-21 NOTE — Telephone Encounter (Signed)
Patient has MRI schedule for Friday 07/30/15-per computer look like Dr.Jaffe ordered this scan. She called here and is asking for "a few xanax to take" prior to the scan. I told her she may need to get from ordering physician. Please advise. Thanks.

## 2015-07-25 ENCOUNTER — Encounter (HOSPITAL_COMMUNITY): Payer: Self-pay | Admitting: Emergency Medicine

## 2015-07-25 ENCOUNTER — Emergency Department (HOSPITAL_COMMUNITY)
Admission: EM | Admit: 2015-07-25 | Discharge: 2015-07-25 | Disposition: A | Discharge status: Home / Self Care | Payer: Medicaid Other | Attending: Emergency Medicine | Admitting: Emergency Medicine

## 2015-07-25 DIAGNOSIS — G8929 Other chronic pain: Secondary | ICD-10-CM | POA: Diagnosis not present

## 2015-07-25 DIAGNOSIS — F419 Anxiety disorder, unspecified: Secondary | ICD-10-CM | POA: Diagnosis not present

## 2015-07-25 DIAGNOSIS — H9201 Otalgia, right ear: Secondary | ICD-10-CM | POA: Diagnosis not present

## 2015-07-25 DIAGNOSIS — Z79899 Other long term (current) drug therapy: Secondary | ICD-10-CM | POA: Insufficient documentation

## 2015-07-25 DIAGNOSIS — Z8679 Personal history of other diseases of the circulatory system: Secondary | ICD-10-CM | POA: Diagnosis not present

## 2015-07-25 DIAGNOSIS — Z720 Tobacco use: Secondary | ICD-10-CM | POA: Diagnosis not present

## 2015-07-25 DIAGNOSIS — Z8541 Personal history of malignant neoplasm of cervix uteri: Secondary | ICD-10-CM | POA: Insufficient documentation

## 2015-07-25 DIAGNOSIS — K219 Gastro-esophageal reflux disease without esophagitis: Secondary | ICD-10-CM | POA: Insufficient documentation

## 2015-07-25 DIAGNOSIS — Z86010 Personal history of colonic polyps: Secondary | ICD-10-CM | POA: Insufficient documentation

## 2015-07-25 DIAGNOSIS — Z8739 Personal history of other diseases of the musculoskeletal system and connective tissue: Secondary | ICD-10-CM | POA: Diagnosis not present

## 2015-07-25 MED ORDER — PSEUDOEPHEDRINE HCL ER 120 MG PO TB12
120.0000 mg | ORAL_TABLET | Freq: Two times a day (BID) | ORAL | Status: DC
  Filled 2015-07-25 (×3): qty 1

## 2015-07-25 MED ORDER — PSEUDOEPHEDRINE HCL ER 120 MG PO TB12: 120.0000 mg | ORAL_TABLET | Freq: Two times a day (BID) | ORAL | Status: DC

## 2015-07-25 NOTE — Discharge Instructions (Signed)
Your ear appears to have a small amount of fluid behind the ear drum You have been given a decongestant please take this on a regular basis fo rthe next several days

## 2015-07-25 NOTE — ED Notes (Signed)
Pt c/o R ear pain and pressure onset yesterday
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
## 2015-07-25 NOTE — ED Notes (Signed)
NP at bedside.

## 2015-07-25 NOTE — ED Notes (Signed)
Pt alert,oriented, and ambulatory upon DC. 

## 2015-07-28 ENCOUNTER — Encounter: Payer: Self-pay | Admitting: Family Medicine

## 2015-07-30 ENCOUNTER — Telehealth: Payer: Self-pay | Admitting: *Deleted

## 2015-07-30 ENCOUNTER — Telehealth: Payer: Self-pay | Admitting: Neurology

## 2015-07-30 ENCOUNTER — Ambulatory Visit
Admission: RE | Admit: 2015-07-30 | Discharge: 2015-07-30 | Disposition: A | Discharge status: Home / Self Care | Payer: Medicaid Other | Source: Ambulatory Visit | Attending: Neurology | Admitting: Neurology

## 2015-07-30 DIAGNOSIS — G44219 Episodic tension-type headache, not intractable: Secondary | ICD-10-CM

## 2015-07-30 DIAGNOSIS — R208 Other disturbances of skin sensation: Secondary | ICD-10-CM

## 2015-07-30 MED ORDER — GADOBENATE DIMEGLUMINE 529 MG/ML IV SOLN
19.0000 mL | Freq: Once | INTRAVENOUS | Status: AC | PRN
  Administered 2015-07-30: 19 mL via INTRAVENOUS

## 2015-07-30 NOTE — Telephone Encounter (Signed)
[phone] Called patient and informed her that I have already given the number to [place].

## 2015-07-30 NOTE — Telephone Encounter (Signed)
Pt called about MRI Referral/ for today/ didn't have the out of pocket in July of this year/call back @ [phone]

## 2015-07-30 NOTE — Telephone Encounter (Signed)
[id number]  MRI auth #

## 2015-08-02 ENCOUNTER — Telehealth: Payer: Self-pay | Admitting: Family Medicine

## 2015-08-02 NOTE — Telephone Encounter (Signed)
Pt called not sure why she was dismissed.  Reviewed chart and advised the multiple times to ER.  Pt was speechless

## 2015-08-03 ENCOUNTER — Telehealth: Payer: Self-pay | Admitting: *Deleted

## 2015-08-03 NOTE — Telephone Encounter (Signed)
-----   Message from Pieter Partridge, DO sent at 08/03/2015  9:58 AM EDT ----- MRI of brain shows tiny spots related to her history of smoking.  However, there is nothing like stroke, bleed or tumor that would be a cause for her headache, tingling or dizziness.  Recommend to try and stop smoking.

## 2015-08-03 NOTE — Telephone Encounter (Signed)
Patient is aware MRI of brain shows tiny spots related to her history of smoking. However, there is nothing like stroke, bleed or tumor that would be a cause for her headache, tingling or dizziness. Recommend to try and stop smoking.

## 2015-08-05 ENCOUNTER — Telehealth: Payer: Self-pay | Admitting: Neurology

## 2015-08-05 NOTE — Telephone Encounter (Signed)
Left patient regarding Physical  Therapy this is what Dr Tomi Likens ordered  Referral is in system .

## 2015-08-05 NOTE — Telephone Encounter (Signed)
Pt called and wanted to know about a referral that Dr Tomi Likens had told her about regarding she said massage therapy/Dawn CB# [phone]

## 2015-08-06 ENCOUNTER — Encounter: Payer: Self-pay | Admitting: Internal Medicine

## 2015-08-06 ENCOUNTER — Telehealth: Payer: Self-pay

## 2015-08-06 ENCOUNTER — Other Ambulatory Visit: Payer: Self-pay

## 2015-08-06 ENCOUNTER — Ambulatory Visit (AMBULATORY_SURGERY_CENTER): Payer: Medicaid Other | Admitting: Internal Medicine

## 2015-08-06 VITALS — BP 116/78 | HR 48 | Temp 96.8°F | Resp 20 | Ht 64.5 in | Wt 202.0 lb

## 2015-08-06 DIAGNOSIS — K2100 Gastro-esophageal reflux disease with esophagitis, without bleeding: Secondary | ICD-10-CM

## 2015-08-06 DIAGNOSIS — K2270 Barrett's esophagus without dysplasia: Secondary | ICD-10-CM | POA: Diagnosis not present

## 2015-08-06 DIAGNOSIS — R1012 Left upper quadrant pain: Secondary | ICD-10-CM

## 2015-08-06 DIAGNOSIS — K219 Gastro-esophageal reflux disease without esophagitis: Secondary | ICD-10-CM

## 2015-08-06 DIAGNOSIS — R6881 Early satiety: Secondary | ICD-10-CM

## 2015-08-06 LAB — GLUCOSE, CAPILLARY
Glucose-Capillary: 77 mg/dL (ref 65–99)
Glucose-Capillary: 95 mg/dL (ref 65–99)

## 2015-08-06 MED ORDER — DEXTROSE 5 % IV SOLN: INTRAVENOUS | Status: DC

## 2015-08-06 MED ORDER — SODIUM CHLORIDE 0.9 % IV SOLN: 500.0000 mL | INTRAVENOUS | Status: DC

## 2015-08-06 NOTE — Op Note (Signed)
[facility]  [address] [place], [id number]   ENDOSCOPY PROCEDURE REPORT  PATIENT: Christine Shah, Christine Shah  MR#: [id number] BIRTHDATE: Oct 01, 1966 , 48  yrs. old GENDER: female ENDOSCOPIST: Gatha Mayer, MD, [facility] PROCEDURE DATE:  08/06/2015 PROCEDURE:  EGD w/ biopsy ASA CLASS:     Class II INDICATIONS:  abdominal pain in upper left quadrant, history of Barrett's esophagus, and follow up of Barrett's esophagus. MEDICATIONS: Propofol 160 mg IV TOPICAL ANESTHETIC: none  DESCRIPTION OF PROCEDURE: After the risks benefits and alternatives of the procedure were thoroughly explained, informed consent was obtained.  The [id number] endoscope was introduced through the mouth and advanced to the second portion of the duodenum , Without limitations.  The instrument was slowly withdrawn as the mucosa was fully examined.    1) 3 tongues of barrett's 1-2 cm length in distal esophagus 33-35 cm - biopsies taken.  2) 5 cm (35-40 cm) hiatal hernia 3) Otherwise normal EGD.  Retroflexed views revealed as previously described.     The scope was then withdrawn from the patient and the procedure completed.  COMPLICATIONS: There were no immediate complications.  ENDOSCOPIC IMPRESSION: 1) 3 tongues of barrett's 1-2 cm length in distal esophagus 33-35 cm - biopsies taken. 2) 5 cm (35-40 cm) hiatal hernia 3) Otherwise normal EGD  RECOMMENDATIONS: 1.  Await biopsy results 2.  Schedule gastric emptying scan (4hr) to evaluate reflux, LUQ pain, early satiety    - pending that consider UGI to assess hiatal hernia, functional dyspepsia Tx   eSigned:  Gatha Mayer, MD, [facility] 08/06/2015 10:22 AM    CC:Eve Tomi Bamberger, MD and The Patient

## 2015-08-06 NOTE — Progress Notes (Signed)
Pt states she feels light headed while in the admitting area- blood sugar-77.  She drank sweet tea at 700.  150 mL of D5 IVF given while in admitting  Pt states relief from lightheadedness before leaving admitting for procedure room

## 2015-08-06 NOTE — Patient Instructions (Addendum)
I found the hiatal hernia and Barrett's esophagus. I took biopsies and will let you know.  Do not have a clear cause of your symptoms.  My office will arrange a test of your stomach emptying (xray study)  I appreciate the opportunity to care for you. Gatha Mayer, MD, FACG  YOU HAD AN ENDOSCOPIC PROCEDURE TODAY AT [facility]:   Refer to the procedure report that was given to you for any specific questions about what was found during the examination.  If the procedure report does not answer your questions, please call your gastroenterologist to clarify.  If you requested that your care partner not be given the details of your procedure findings, then the procedure report has been included in a sealed envelope for you to review at your convenience later.  YOU SHOULD EXPECT: Some feelings of bloating in the abdomen. Passage of more gas than usual.  Walking can help get rid of the air that was put into your GI tract during the procedure and reduce the bloating. If you had a lower endoscopy (such as a colonoscopy or flexible sigmoidoscopy) you may notice spotting of blood in your stool or on the toilet paper. If you underwent a bowel prep for your procedure, you may not have a normal bowel movement for a few days.  Please Note:  You might notice some irritation and congestion in your nose or some drainage.  This is from the oxygen used during your procedure.  There is no need for concern and it should clear up in a day or so.  SYMPTOMS TO REPORT IMMEDIATELY:     Following upper endoscopy (EGD)  Vomiting of blood or coffee ground material  New chest pain or pain under the shoulder blades  Painful or persistently difficult swallowing  New shortness of breath  Fever of 100F or higher  Black, tarry-looking stools  For urgent or emergent issues, a gastroenterologist can be reached at any hour by calling [phone].   DIET: Your first meal following the  procedure should be a small meal and then it is ok to progress to your normal diet. Heavy or fried foods are harder to digest and may make you feel nauseous or bloated.  Likewise, meals heavy in dairy and vegetables can increase bloating.  Drink plenty of fluids but you should avoid alcoholic beverages for 24 hours.  ACTIVITY:  You should plan to take it easy for the rest of today and you should NOT DRIVE or use heavy machinery until tomorrow (because of the sedation medicines used during the test).    FOLLOW UP: Our staff will call the number listed on your records the next business day following your procedure to check on you and address any questions or concerns that you may have regarding the information given to you following your procedure. If we do not reach you, we will leave a message.  However, if you are feeling well and you are not experiencing any problems, there is no need to return our call.  We will assume that you have returned to your regular daily activities without incident.  If any biopsies were taken you will be contacted by phone or by letter within the next 1-3 weeks.  Please call us at [phone] if you have not heard about the biopsies in 3 weeks.    SIGNATURES/CONFIDENTIALITY: You and/or your care partner have signed paperwork which will be entered into your electronic medical record.  These signatures  attest to the fact that that the information above on your After Visit Summary has been reviewed and is understood.  Full responsibility of the confidentiality of this discharge information lies with you and/or your care-partner.  Barrett's esophagus and hiatal hernia information given.

## 2015-08-06 NOTE — Progress Notes (Signed)
To recovery, report to Scott, RN, VSS 

## 2015-08-06 NOTE — Telephone Encounter (Signed)
Patient is scheduled for GES per procedure report from today.  She verbalized understanding of appt instructions and to arrive at 9:15 for a 9:30 appt at [facility] 08/24/15.

## 2015-08-06 NOTE — Progress Notes (Signed)
Called to room to assist during endoscopic procedure.  Patient ID and intended procedure confirmed with present staff. Received instructions for my participation in the procedure from the performing physician.  

## 2015-08-10 ENCOUNTER — Telehealth: Payer: Self-pay

## 2015-08-10 NOTE — Telephone Encounter (Signed)
  Follow up Call-  Call back number 08/06/2015  Post procedure Call Back phone  #  [phone]  Permission to leave phone message Yes     Patient questions:  Do you have a fever, pain , or abdominal swelling? No. Pain Score  0 *  Have you tolerated food without any problems? Yes.    Have you been able to return to your normal activities? Yes.    Do you have any questions about your discharge instructions: Diet   No. Medications  No. Follow up visit  No.  Do you have questions or concerns about your Care? No.  Actions: * If pain score is 4 or above: No action needed, pain <4.

## 2015-08-15 ENCOUNTER — Encounter: Payer: Self-pay | Admitting: Internal Medicine

## 2015-08-15 DIAGNOSIS — K2270 Barrett's esophagus without dysplasia: Secondary | ICD-10-CM

## 2015-08-15 HISTORY — DX: Barrett's esophagus without dysplasia: K22.70

## 2015-08-15 NOTE — Progress Notes (Signed)
Quick Note:  + Barrett's Repeat EGD 3 yrs 2019 Notify by My Chart No letter ______

## 2015-08-24 ENCOUNTER — Encounter (HOSPITAL_COMMUNITY)
Admission: RE | Admit: 2015-08-24 | Discharge: 2015-08-24 | Disposition: A | Discharge status: Home / Self Care | Payer: Medicaid Other | Source: Ambulatory Visit | Attending: Internal Medicine | Admitting: Internal Medicine

## 2015-08-24 DIAGNOSIS — R6881 Early satiety: Secondary | ICD-10-CM

## 2015-08-24 DIAGNOSIS — R1012 Left upper quadrant pain: Secondary | ICD-10-CM | POA: Insufficient documentation

## 2015-08-24 DIAGNOSIS — K219 Gastro-esophageal reflux disease without esophagitis: Secondary | ICD-10-CM | POA: Diagnosis present

## 2015-08-24 MED ORDER — TECHNETIUM TC 99M SULFUR COLLOID
2.1000 | Freq: Once | INTRAVENOUS | Status: DC | PRN
  Administered 2015-08-24: 2.1 via INTRAVENOUS
  Filled 2015-08-24: qty 2.1

## 2015-08-27 ENCOUNTER — Other Ambulatory Visit: Payer: Self-pay

## 2015-08-27 DIAGNOSIS — K449 Diaphragmatic hernia without obstruction or gangrene: Secondary | ICD-10-CM

## 2015-08-27 DIAGNOSIS — R1012 Left upper quadrant pain: Secondary | ICD-10-CM

## 2015-08-27 NOTE — Progress Notes (Signed)
Quick Note:  Stomach is emptying in a normal fashion Schedule upper GI series to evaluate hiatal hernia and LUQ pain please ______

## 2015-09-03 ENCOUNTER — Ambulatory Visit (HOSPITAL_COMMUNITY)
Admission: RE | Admit: 2015-09-03 | Discharge: 2015-09-03 | Disposition: A | Discharge status: Home / Self Care | Payer: Medicaid Other | Source: Ambulatory Visit | Attending: Internal Medicine | Admitting: Internal Medicine

## 2015-09-03 DIAGNOSIS — K219 Gastro-esophageal reflux disease without esophagitis: Secondary | ICD-10-CM | POA: Insufficient documentation

## 2015-09-03 DIAGNOSIS — R1012 Left upper quadrant pain: Secondary | ICD-10-CM

## 2015-09-03 DIAGNOSIS — K449 Diaphragmatic hernia without obstruction or gangrene: Secondary | ICD-10-CM

## 2015-09-03 NOTE — Progress Notes (Signed)
Quick Note:  Upper GI shows small hiatal hernia  So far none of the tests tell me exactly why she is hurting Based upon what she has told us it could be that her stomach is not relaxing well and accepting the food she eats - so she has pain and bloating then  Buspirone is a medication that can help that problem - it was originally for anxiety but it helps with problems like she has  I recommend 10 mg bid # 60 2 RF - take 1/2 tab bid first week  See me Nov/dec for f/u Sometimes this medication can make one sleepy so may need to stay at lower dose, also very rarely causes nightmares - if she thinks she is having side effects then call back before REV   ______

## 2015-09-06 ENCOUNTER — Other Ambulatory Visit: Payer: Self-pay

## 2015-09-06 MED ORDER — BUSPIRONE HCL 10 MG PO TABS: ORAL_TABLET | ORAL | Status: DC

## 2015-09-07 ENCOUNTER — Ambulatory Visit
Admission: RE | Admit: 2015-09-07 | Discharge: 2015-09-07 | Disposition: A | Discharge status: Home / Self Care | Payer: Medicaid Other | Source: Ambulatory Visit | Attending: Surgery | Admitting: Surgery

## 2015-09-10 ENCOUNTER — Encounter: Payer: Self-pay | Admitting: Neurology

## 2015-09-10 ENCOUNTER — Inpatient Hospital Stay (HOSPITAL_COMMUNITY): Payer: Medicaid Other

## 2015-09-10 ENCOUNTER — Ambulatory Visit (INDEPENDENT_AMBULATORY_CARE_PROVIDER_SITE_OTHER): Payer: Medicaid Other | Admitting: Neurology

## 2015-09-10 ENCOUNTER — Encounter (HOSPITAL_COMMUNITY): Payer: Self-pay | Admitting: *Deleted

## 2015-09-10 ENCOUNTER — Inpatient Hospital Stay (HOSPITAL_COMMUNITY)
Admission: AD | Admit: 2015-09-10 | Discharge: 2015-09-10 | Disposition: A | Discharge status: Home / Self Care | Payer: Medicaid Other | Source: Ambulatory Visit | Attending: Obstetrics & Gynecology | Admitting: Obstetrics & Gynecology

## 2015-09-10 VITALS — BP 118/70 | HR 78 | Ht 64.5 in | Wt 206.3 lb

## 2015-09-10 DIAGNOSIS — F17200 Nicotine dependence, unspecified, uncomplicated: Secondary | ICD-10-CM | POA: Insufficient documentation

## 2015-09-10 DIAGNOSIS — R1030 Lower abdominal pain, unspecified: Secondary | ICD-10-CM | POA: Insufficient documentation

## 2015-09-10 DIAGNOSIS — Z720 Tobacco use: Secondary | ICD-10-CM

## 2015-09-10 DIAGNOSIS — Z8541 Personal history of malignant neoplasm of cervix uteri: Secondary | ICD-10-CM | POA: Diagnosis not present

## 2015-09-10 DIAGNOSIS — G44219 Episodic tension-type headache, not intractable: Secondary | ICD-10-CM | POA: Diagnosis not present

## 2015-09-10 DIAGNOSIS — K219 Gastro-esophageal reflux disease without esophagitis: Secondary | ICD-10-CM | POA: Insufficient documentation

## 2015-09-10 DIAGNOSIS — F419 Anxiety disorder, unspecified: Secondary | ICD-10-CM | POA: Diagnosis not present

## 2015-09-10 DIAGNOSIS — R208 Other disturbances of skin sensation: Secondary | ICD-10-CM

## 2015-09-10 DIAGNOSIS — K2270 Barrett's esophagus without dysplasia: Secondary | ICD-10-CM | POA: Diagnosis not present

## 2015-09-10 LAB — CBC WITH DIFFERENTIAL/PLATELET
BASOS ABS: 0.1 10*3/uL (ref 0.0–0.1)
Basophils Relative: 1 %
EOS PCT: 5 %
Eosinophils Absolute: 0.6 10*3/uL (ref 0.0–0.7)
HEMATOCRIT: 43.5 % (ref 36.0–46.0)
Hemoglobin: 15.1 g/dL — ABNORMAL HIGH (ref 12.0–15.0)
LYMPHS ABS: 2.2 10*3/uL (ref 0.7–4.0)
LYMPHS PCT: 19 %
MCH: 32.1 pg (ref 26.0–34.0)
MCHC: 34.7 g/dL (ref 30.0–36.0)
MCV: 92.4 fL (ref 78.0–100.0)
MONO ABS: 0.7 10*3/uL (ref 0.1–1.0)
Monocytes Relative: 6 %
NEUTROS ABS: 8.2 10*3/uL — AB (ref 1.7–7.7)
Neutrophils Relative %: 69 %
PLATELETS: 209 10*3/uL (ref 150–400)
RBC: 4.71 MIL/uL (ref 3.87–5.11)
RDW: 13.1 % (ref 11.5–15.5)
WBC: 11.7 10*3/uL — ABNORMAL HIGH (ref 4.0–10.5)

## 2015-09-10 LAB — URINALYSIS, ROUTINE W REFLEX MICROSCOPIC
BILIRUBIN URINE: NEGATIVE
Glucose, UA: NEGATIVE mg/dL
Ketones, ur: NEGATIVE mg/dL
Leukocytes, UA: NEGATIVE
Nitrite: NEGATIVE
PH: 6 (ref 5.0–8.0)
Protein, ur: NEGATIVE mg/dL
UROBILINOGEN UA: 0.2 mg/dL (ref 0.0–1.0)

## 2015-09-10 LAB — URINE MICROSCOPIC-ADD ON

## 2015-09-10 LAB — POCT PREGNANCY, URINE: PREG TEST UR: NEGATIVE

## 2015-09-10 MED ORDER — KETOROLAC TROMETHAMINE 60 MG/2ML IM SOLN
60.0000 mg | Freq: Once | INTRAMUSCULAR | Status: AC
  Administered 2015-09-10: 60 mg via INTRAMUSCULAR
  Filled 2015-09-10: qty 2

## 2015-09-10 NOTE — Discharge Instructions (Signed)
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
## 2015-09-10 NOTE — Patient Instructions (Signed)
Consider supplements:  Magnesium oxide 400mg  to 600mg  daily, riboflavin 400mg , Coenzyme Q 10 100mg  three times daily Follow up as needed.

## 2015-09-10 NOTE — MAU Note (Signed)
Pain and pressure in lower abd, burning.  Has been going on for a couple wks.  Getting worse. Some frequency and pressure with urination. No GI complaints

## 2015-09-13 ENCOUNTER — Telehealth: Payer: Self-pay | Admitting: Internal Medicine

## 2015-09-13 NOTE — Telephone Encounter (Signed)
Patient was seen with lower pelvic pain in the ED at [facility] this weekend.  They instructed her to go to the ER at [facility], she did not go.  This is not consistent with the pain she has been evaluated here for (treating here for LUQ pain). She is having lower abdominal pain, pressure and urinary frequency.   She is asked to see her primary care , I do not have any office visits this week.  She states that her primary care has "let me go".  She is advised to contact Medicaid to get assigned to a new primary care.  i recommended she proceed to the ER for eval .

## 2015-09-18 ENCOUNTER — Encounter (HOSPITAL_COMMUNITY): Payer: Self-pay | Admitting: Emergency Medicine

## 2015-09-18 ENCOUNTER — Emergency Department (HOSPITAL_COMMUNITY)
Admission: EM | Admit: 2015-09-18 | Discharge: 2015-09-19 | Disposition: A | Discharge status: Home / Self Care | Payer: Medicaid Other | Attending: Emergency Medicine | Admitting: Emergency Medicine

## 2015-09-18 DIAGNOSIS — F419 Anxiety disorder, unspecified: Secondary | ICD-10-CM | POA: Insufficient documentation

## 2015-09-18 DIAGNOSIS — K5732 Diverticulitis of large intestine without perforation or abscess without bleeding: Secondary | ICD-10-CM | POA: Insufficient documentation

## 2015-09-18 DIAGNOSIS — Z8541 Personal history of malignant neoplasm of cervix uteri: Secondary | ICD-10-CM | POA: Diagnosis not present

## 2015-09-18 DIAGNOSIS — Z86010 Personal history of colonic polyps: Secondary | ICD-10-CM | POA: Insufficient documentation

## 2015-09-18 DIAGNOSIS — Z9851 Tubal ligation status: Secondary | ICD-10-CM | POA: Insufficient documentation

## 2015-09-18 DIAGNOSIS — R1031 Right lower quadrant pain: Secondary | ICD-10-CM | POA: Diagnosis present

## 2015-09-18 DIAGNOSIS — Z79899 Other long term (current) drug therapy: Secondary | ICD-10-CM | POA: Diagnosis not present

## 2015-09-18 DIAGNOSIS — Z8739 Personal history of other diseases of the musculoskeletal system and connective tissue: Secondary | ICD-10-CM | POA: Diagnosis not present

## 2015-09-18 DIAGNOSIS — Z3202 Encounter for pregnancy test, result negative: Secondary | ICD-10-CM | POA: Diagnosis not present

## 2015-09-18 DIAGNOSIS — G8929 Other chronic pain: Secondary | ICD-10-CM | POA: Diagnosis not present

## 2015-09-18 DIAGNOSIS — Z720 Tobacco use: Secondary | ICD-10-CM | POA: Diagnosis not present

## 2015-09-18 LAB — COMPREHENSIVE METABOLIC PANEL
ALK PHOS: 60 U/L (ref 38–126)
ALT: 15 U/L (ref 14–54)
AST: 18 U/L (ref 15–41)
Albumin: 4 g/dL (ref 3.5–5.0)
Anion gap: 9 (ref 5–15)
BILIRUBIN TOTAL: 0.5 mg/dL (ref 0.3–1.2)
BUN: 5 mg/dL — AB (ref 6–20)
CALCIUM: 9 mg/dL (ref 8.9–10.3)
CO2: 24 mmol/L (ref 22–32)
CREATININE: 0.72 mg/dL (ref 0.44–1.00)
Chloride: 107 mmol/L (ref 101–111)
GFR calc Af Amer: >60 mL/min (ref >60)
GFR calc non Af Amer: >60 mL/min (ref >60)
GLUCOSE: 110 mg/dL — AB (ref 65–99)
POTASSIUM: 3.8 mmol/L (ref 3.5–5.1)
Sodium: 140 mmol/L (ref 135–145)
TOTAL PROTEIN: 6.9 g/dL (ref 6.5–8.1)

## 2015-09-18 LAB — CBC
HEMATOCRIT: 44.4 % (ref 36.0–46.0)
Hemoglobin: 15.4 g/dL — ABNORMAL HIGH (ref 12.0–15.0)
MCH: 31.4 pg (ref 26.0–34.0)
MCHC: 34.7 g/dL (ref 30.0–36.0)
MCV: 90.6 fL (ref 78.0–100.0)
Platelets: 206 10*3/uL (ref 150–400)
RBC: 4.9 MIL/uL (ref 3.87–5.11)
RDW: 12.5 % (ref 11.5–15.5)
WBC: 11.5 10*3/uL — ABNORMAL HIGH (ref 4.0–10.5)

## 2015-09-18 LAB — HCG, QUANTITATIVE, PREGNANCY: hCG, Beta Chain, Quant, S: <1 m[IU]/mL (ref <5)

## 2015-09-18 LAB — LIPASE, BLOOD: Lipase: 28 U/L (ref 22–51)

## 2015-09-18 NOTE — ED Notes (Signed)
Pt observed in lobby eating and drinking.

## 2015-09-18 NOTE — ED Notes (Signed)
Pt arrived to the ED with a complaint of lower abdominal pain.  Pt was seen at [facility] a week ago with no significant findings.  Pt is complaining of pressure in her vaginal and rectal areas.  Pt also states she has sharp pain in her lower stomach.

## 2015-09-19 ENCOUNTER — Emergency Department (HOSPITAL_COMMUNITY): Payer: Medicaid Other

## 2015-09-19 LAB — URINALYSIS, ROUTINE W REFLEX MICROSCOPIC
BILIRUBIN URINE: NEGATIVE
GLUCOSE, UA: NEGATIVE mg/dL
Hgb urine dipstick: NEGATIVE
KETONES UR: NEGATIVE mg/dL
LEUKOCYTES UA: NEGATIVE
NITRITE: NEGATIVE
PH: 6.5 (ref 5.0–8.0)
PROTEIN: NEGATIVE mg/dL
Specific Gravity, Urine: 1.014 (ref 1.005–1.030)
Urobilinogen, UA: 1 mg/dL (ref 0.0–1.0)

## 2015-09-19 LAB — DIFFERENTIAL
BASOS PCT: 1 %
Basophils Absolute: 0.1 10*3/uL (ref 0.0–0.1)
EOS ABS: 0.5 10*3/uL (ref 0.0–0.7)
Eosinophils Relative: 5 %
Lymphocytes Relative: 15 %
Lymphs Abs: 1.7 10*3/uL (ref 0.7–4.0)
MONO ABS: 0.8 10*3/uL (ref 0.1–1.0)
MONOS PCT: 6 %
NEUTROS ABS: 8.8 10*3/uL — AB (ref 1.7–7.7)
Neutrophils Relative %: 73 %

## 2015-09-19 MED ORDER — CIPROFLOXACIN HCL 500 MG PO TABS: 500.0000 mg | ORAL_TABLET | Freq: Two times a day (BID) | ORAL | Status: DC

## 2015-09-19 MED ORDER — IOHEXOL 300 MG/ML  SOLN
50.0000 mL | Freq: Once | INTRAMUSCULAR | Status: AC | PRN
  Administered 2015-09-19: 50 mL via ORAL

## 2015-09-19 MED ORDER — ONDANSETRON HCL 4 MG/2ML IJ SOLN
4.0000 mg | Freq: Once | INTRAMUSCULAR | Status: AC
  Administered 2015-09-19: 4 mg via INTRAVENOUS
  Filled 2015-09-19: qty 2

## 2015-09-19 MED ORDER — ONDANSETRON HCL 4 MG PO TABS: 4.0000 mg | ORAL_TABLET | Freq: Four times a day (QID) | ORAL | Status: DC

## 2015-09-19 MED ORDER — SODIUM CHLORIDE 0.9 % IV BOLUS (SEPSIS)
500.0000 mL | Freq: Once | INTRAVENOUS | Status: AC
  Administered 2015-09-19: 500 mL via INTRAVENOUS

## 2015-09-19 MED ORDER — CIPROFLOXACIN HCL 500 MG PO TABS
500.0000 mg | ORAL_TABLET | Freq: Once | ORAL | Status: AC
Start: 2015-09-19 — End: 2015-09-19
  Administered 2015-09-19: 500 mg via ORAL
  Filled 2015-09-19: qty 1

## 2015-09-19 MED ORDER — KETOROLAC TROMETHAMINE 30 MG/ML IJ SOLN
30.0000 mg | Freq: Once | INTRAMUSCULAR | Status: AC
  Administered 2015-09-19: 30 mg via INTRAVENOUS
  Filled 2015-09-19: qty 1

## 2015-09-19 MED ORDER — METRONIDAZOLE 500 MG PO TABS
500.0000 mg | ORAL_TABLET | Freq: Once | ORAL | Status: AC
  Administered 2015-09-19: 500 mg via ORAL
  Filled 2015-09-19: qty 1

## 2015-09-19 MED ORDER — HYDROMORPHONE HCL 1 MG/ML IJ SOLN
0.5000 mg | Freq: Once | INTRAMUSCULAR | Status: AC
  Administered 2015-09-19: 0.5 mg via INTRAVENOUS
  Filled 2015-09-19: qty 1

## 2015-09-19 MED ORDER — METRONIDAZOLE 500 MG PO TABS: 500.0000 mg | ORAL_TABLET | Freq: Two times a day (BID) | ORAL | Status: DC

## 2015-09-19 MED ORDER — FENTANYL CITRATE (PF) 100 MCG/2ML IJ SOLN
50.0000 ug | Freq: Once | INTRAMUSCULAR | Status: AC
  Administered 2015-09-19: 50 ug via INTRAVENOUS
  Filled 2015-09-19: qty 2

## 2015-09-19 MED ORDER — OXYCODONE-ACETAMINOPHEN 5-325 MG PO TABS: 1.0000 | ORAL_TABLET | ORAL | Status: DC | PRN

## 2015-09-19 MED ORDER — IOHEXOL 300 MG/ML  SOLN
100.0000 mL | Freq: Once | INTRAMUSCULAR | Status: AC | PRN
  Administered 2015-09-19: 100 mL via INTRAVENOUS
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
## 2015-09-19 NOTE — Discharge Instructions (Signed)
Diverticulitis  Diverticulitis is when small pockets that have formed in your colon (large intestine) become infected or swollen.  HOME CARE  · Follow your doctor's instructions.  · Follow a special diet if told by your doctor.  · When you feel better, your doctor may tell you to change your diet. You may be told to eat a lot of fiber. Fruits and vegetables are good sources of fiber. Fiber makes it easier to poop (have bowel movements).  · Take supplements or probiotics as told by your doctor.  · Only take medicines as told by your doctor.  · Keep all follow-up visits with your doctor.  GET HELP IF:  · Your pain does not get better.  · You have a hard time eating food.  · You are not pooping like normal.  GET HELP RIGHT AWAY IF:  · Your pain gets worse.  · Your problems do not get better.  · Your problems suddenly get worse.  · You have a fever.  · You keep throwing up (vomiting).  · You have bloody or black, tarry poop (stool).  MAKE SURE YOU:   · Understand these instructions.  · Will watch your condition.  · Will get help right away if you are not doing well or get worse.     This information is not intended to replace advice given to you by your health care provider. Make sure you discuss any questions you have with your health care provider.     Document Released: 05/08/2008 Document Revised: 11/25/2013 Document Reviewed: 10/15/2013  Elsevier Interactive Patient Education ©2016 [employer]

## 2015-09-21 ENCOUNTER — Telehealth: Payer: Self-pay | Admitting: Internal Medicine

## 2015-09-21 MED ORDER — AMOXICILLIN-POT CLAVULANATE 875-125 MG PO TABS: 1.0000 | ORAL_TABLET | Freq: Two times a day (BID) | ORAL | Status: DC

## 2015-09-21 NOTE — Telephone Encounter (Signed)
Patient notified rx sent 

## 2015-09-21 NOTE — Telephone Encounter (Signed)
Augmetin 875/125 mg, 1 po bid with food, #14 DC Flagyl

## 2015-09-21 NOTE — Telephone Encounter (Signed)
Patient was evaluated in the ED on Saturday and diagnosed with diverticulitis by CT.  She was started on cipro and flagyl.  Patient reports that she has an interaction with cipro and buspirone.  She is having some chest pressure.  Her pharmacist recommended that she stop the cipro.  She is asking if the flagyl will be enough to treat the diverticulitis alone? Dr. Fuller Plan this is a Dr. Carlean Purl patient please advise

## 2015-09-24 ENCOUNTER — Encounter (HOSPITAL_COMMUNITY): Payer: Self-pay | Admitting: Oncology

## 2015-09-24 ENCOUNTER — Emergency Department (HOSPITAL_COMMUNITY): Payer: Medicaid Other

## 2015-09-24 ENCOUNTER — Telehealth: Payer: Self-pay | Admitting: Internal Medicine

## 2015-09-24 ENCOUNTER — Emergency Department (HOSPITAL_COMMUNITY)
Admission: EM | Admit: 2015-09-24 | Discharge: 2015-09-25 | Disposition: A | Discharge status: Home / Self Care | Payer: Medicaid Other | Attending: Emergency Medicine | Admitting: Emergency Medicine

## 2015-09-24 DIAGNOSIS — K219 Gastro-esophageal reflux disease without esophagitis: Secondary | ICD-10-CM | POA: Insufficient documentation

## 2015-09-24 DIAGNOSIS — Z8739 Personal history of other diseases of the musculoskeletal system and connective tissue: Secondary | ICD-10-CM | POA: Insufficient documentation

## 2015-09-24 DIAGNOSIS — F419 Anxiety disorder, unspecified: Secondary | ICD-10-CM | POA: Diagnosis not present

## 2015-09-24 DIAGNOSIS — R1030 Lower abdominal pain, unspecified: Secondary | ICD-10-CM | POA: Diagnosis present

## 2015-09-24 DIAGNOSIS — Z792 Long term (current) use of antibiotics: Secondary | ICD-10-CM | POA: Insufficient documentation

## 2015-09-24 DIAGNOSIS — Z3202 Encounter for pregnancy test, result negative: Secondary | ICD-10-CM | POA: Diagnosis not present

## 2015-09-24 DIAGNOSIS — K2270 Barrett's esophagus without dysplasia: Secondary | ICD-10-CM | POA: Diagnosis not present

## 2015-09-24 DIAGNOSIS — Z86010 Personal history of colonic polyps: Secondary | ICD-10-CM | POA: Diagnosis not present

## 2015-09-24 DIAGNOSIS — K5792 Diverticulitis of intestine, part unspecified, without perforation or abscess without bleeding: Secondary | ICD-10-CM

## 2015-09-24 DIAGNOSIS — Z79899 Other long term (current) drug therapy: Secondary | ICD-10-CM | POA: Diagnosis not present

## 2015-09-24 DIAGNOSIS — Z8541 Personal history of malignant neoplasm of cervix uteri: Secondary | ICD-10-CM | POA: Insufficient documentation

## 2015-09-24 DIAGNOSIS — N898 Other specified noninflammatory disorders of vagina: Secondary | ICD-10-CM | POA: Insufficient documentation

## 2015-09-24 DIAGNOSIS — Z8679 Personal history of other diseases of the circulatory system: Secondary | ICD-10-CM | POA: Insufficient documentation

## 2015-09-24 DIAGNOSIS — Z720 Tobacco use: Secondary | ICD-10-CM | POA: Insufficient documentation

## 2015-09-24 LAB — URINALYSIS, ROUTINE W REFLEX MICROSCOPIC
BILIRUBIN URINE: NEGATIVE
GLUCOSE, UA: NEGATIVE mg/dL
KETONES UR: NEGATIVE mg/dL
NITRITE: NEGATIVE
PH: 6.5 (ref 5.0–8.0)
Protein, ur: NEGATIVE mg/dL
SPECIFIC GRAVITY, URINE: 1.015 (ref 1.005–1.030)
Urobilinogen, UA: 1 mg/dL (ref 0.0–1.0)

## 2015-09-24 LAB — CBC
HEMATOCRIT: 44.3 % (ref 36.0–46.0)
Hemoglobin: 15.7 g/dL — ABNORMAL HIGH (ref 12.0–15.0)
MCH: 32.5 pg (ref 26.0–34.0)
MCHC: 35.4 g/dL (ref 30.0–36.0)
MCV: 91.7 fL (ref 78.0–100.0)
PLATELETS: 225 10*3/uL (ref 150–400)
RBC: 4.83 MIL/uL (ref 3.87–5.11)
RDW: 12.6 % (ref 11.5–15.5)
WBC: 9.1 10*3/uL (ref 4.0–10.5)

## 2015-09-24 LAB — COMPREHENSIVE METABOLIC PANEL
ALBUMIN: 4.3 g/dL (ref 3.5–5.0)
ALT: 15 U/L (ref 14–54)
AST: 22 U/L (ref 15–41)
Alkaline Phosphatase: 62 U/L (ref 38–126)
Anion gap: 7 (ref 5–15)
BUN: 6 mg/dL (ref 6–20)
CHLORIDE: 110 mmol/L (ref 101–111)
CO2: 23 mmol/L (ref 22–32)
CREATININE: 0.7 mg/dL (ref 0.44–1.00)
Calcium: 8.8 mg/dL — ABNORMAL LOW (ref 8.9–10.3)
GFR calc Af Amer: >60 mL/min (ref >60)
GFR calc non Af Amer: >60 mL/min (ref >60)
GLUCOSE: 106 mg/dL — AB (ref 65–99)
POTASSIUM: 4.2 mmol/L (ref 3.5–5.1)
SODIUM: 140 mmol/L (ref 135–145)
Total Bilirubin: 0.7 mg/dL (ref 0.3–1.2)
Total Protein: 7.2 g/dL (ref 6.5–8.1)

## 2015-09-24 LAB — LIPASE, BLOOD: LIPASE: 31 U/L (ref 11–51)

## 2015-09-24 LAB — URINE MICROSCOPIC-ADD ON

## 2015-09-24 LAB — I-STAT BETA HCG BLOOD, ED (MC, WL, AP ONLY)

## 2015-09-24 MED ORDER — FLUCONAZOLE 150 MG PO TABS
150.0000 mg | ORAL_TABLET | Freq: Once | ORAL | Status: AC
  Administered 2015-09-24: 150 mg via ORAL
  Filled 2015-09-24: qty 1

## 2015-09-24 NOTE — ED Notes (Signed)
Pt presents d/t lower abdominal pain.  Pt has had this pain x 1 month.  Recently placed on Cipro and Flagyl that caused SOB, pt's GI doc switched to Augmentin to treat diverticulitis.  Pt is speaking in full sentences, RR in even and unlabored.
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
## 2015-09-25 NOTE — Discharge Instructions (Signed)
Diverticulitis You can take the Percocet previously prescribed to use as needed for pain. Make sure that you finish the antibiotics prescribed for you. If you continue to have significant pain or don't improve after complet the antibiotics prescribed, call Dr. Carlean Purl to schedule follow-up appointment. Return for vomiting, fever, or if pain is not well-controlled with Percocet prescribed. Call the [facility] or any of the numbers on the resource guide to get a primary care physician. Diverticulitis is when small pockets that have formed in your colon (large intestine) become infected or swollen. HOME CARE  Follow your doctor's instructions.  Follow a special diet if told by your doctor.  When you feel better, your doctor may tell you to change your diet. You may be told to eat a lot of fiber. Fruits and vegetables are good sources of fiber. Fiber makes it easier to poop (have bowel movements).  Take supplements or probiotics as told by your doctor.  Only take medicines as told by your doctor.  Keep all follow-up visits with your doctor. GET HELP IF:  Your pain does not get better.  You have a hard time eating food.  You are not pooping like normal. GET HELP RIGHT AWAY IF:  Your pain gets worse.  Your problems do not get better.  Your problems suddenly get worse.  You have a fever.  You keep throwing up (vomiting).  You have bloody or black, tarry poop (stool). MAKE SURE YOU:   Understand these instructions.  Will watch your condition.  Will get help right away if you are not doing well or get worse.   This information is not intended to replace advice given to you by your health care provider. Make sure you discuss any questions you have with your health care provider.   Document Released: 05/08/2008 Document Revised: 11/25/2013 Document Reviewed: 10/15/2013 Elsevier Interactive Patient Education 2016 [employer].  Emergency Department  Resource Guide 1) Find a Doctor and Pay Out of Pocket Although you won't have to find out who is covered by your insurance plan, it is a good idea to ask around and get recommendations. You will then need to call the office and see if the doctor you have chosen will accept you as a new patient and what types of options they offer for patients who are self-pay. Some doctors offer discounts or will set up payment plans for their patients who do not have insurance, but you will need to ask so you aren't surprised when you get to your appointment.  2) Contact Your Local Health Department Not all health departments have doctors that can see patients for sick visits, but many do, so it is worth a call to see if yours does. If you don't know where your local health department is, you can check in your phone book. The CDC also has a tool to help you locate your state's health department, and many state websites also have listings of all of their local health departments.  3) Find a [facility] If your illness is not likely to be very severe or complicated, you may want to try a walk in clinic. These are popping up all over the country in pharmacies, drugstores, and shopping centers. They're usually staffed by nurse practitioners or physician assistants that have been trained to treat common illnesses and complaints. They're usually fairly quick and inexpensive. However, if you have serious medical issues or chronic medical problems, these are probably not your best  option.  No Primary Care Doctor: - Call Health Connect at  [phone] - they can help you locate a primary care doctor that  accepts your insurance, provides certain services, etc. - Physician Referral Service- [phone]  Chronic Pain Problems: Organization         Address  Phone   Notes  [facility]  [phone] Patients need to be referred by their primary care doctor.   Medication Assistance: Organization          Address  Phone   Notes  [facility] Medication [facility] [address] [address] [place], [place] [address] [phone] --Must be a resident of [facility] -- Must have NO insurance coverage whatsoever (no Medicaid/ Medicare, etc.) -- The pt. MUST have a primary care doctor that directs their care regularly and follows them in the community   MedAssist  [phone]   [employer]  [phone]    Agencies that provide inexpensive medical care: Organization         Address  Phone   Notes  [place]  [phone]   [facility]    [phone]   [facility] [place], [place] [address] [phone]   [place] [address], [place] [phone]   Planned Parenthood    [phone]   [facility]    [phone]   [place] and [place] [address], [place] Phone:  [phone], Fax:  [phone] Hours of Operation:  9 am - 6 pm, M-F.  Also accepts Medicaid/Medicare and self-pay.  [facility] [place], [address], [place] Phone: [phone], Fax: [phone]. Hours of Operation:  8:30 am - 5:30 pm, M-F.  Also accepts Medicaid and self-pay.  [facility] [place] [address] [place] Phone: [phone]   [place], [place], [place] [phone], Ext. [phone] Mondays & Thursdays: 7-9 AM.  First 15 patients are seen on a first come, first serve basis.    [place] Providers:  Organization         Address  Phone   Notes  [facility] [address], [place] [phone] Also accepts self-pay patients.  [facility] [address], [place]  [phone]   [place], [address], [place] [phone]   [facility] [address], [place] [phone]   Lucianne Lei [address], [place]   [phone] Only accepts [place] Access [place] patients after they have their name applied to their card.   Self-Pay (no insurance) in [facility]:  Organization         Address  Phone   Notes  Sickle Cell Patients, [facility] Internal Medicine [place] [phone]   [facility] Urgent Care [place] [phone]   [facility] [place]  [place], [place], [place] [phone]   [facility]/Dr. Osei-Bonsu  [address], [place] or [address], [place] [phone] Phone number for both [place] and [place] locations is the same.  Urgent Medical and [facility] [address], [place] (  [place]) [phone]   [place], [place] or [address] [phone]   [facility] [address], [place] [phone], phone; [phone], fax Sees patients 1st and 3rd Saturday of every month.  Must not qualify for public or private insurance (i.e. Medicaid, Medicare, [place] Health Choice, Veterans' Benefits)  Household income should be no more than 200% of the poverty level The clinic cannot treat you if you are pregnant or think you are pregnant  Sexually transmitted diseases are not treated at the clinic.    Dental Care: Organization         Address  Phone  Notes  [facility] of [facility] [place] [phone] Accepts children up to age 37 who are enrolled in [place] or [place]; pregnant women with a Medicaid card; and children who have applied for Medicaid or [place] Health Choice, but were declined, whose parents can pay a reduced fee at time of service.  [facility] of [facility]  [address], [place] [phone] Accepts children up to age 42 who are enrolled in [place] or [place]; pregnant women with a Medicaid card; and children who have applied for Medicaid or [place] Health Choice, but were declined, whose parents can pay a reduced fee at time of service.  [place] Adult Dental Access PROGRAM  [place] [phone] Patients are seen by appointment only. Walk-ins are not accepted. [place] will see patients 58 years of age and older. Monday - Tuesday (8am-5pm) Most Wednesdays (8:30-5pm) $30 per visit, cash only  [facility] Adult Dental Access PROGRAM  [address], [facility] [phone] Patients are seen by appointment only. Walk-ins are not accepted. [place] will see patients 37 years of age and older. One Wednesday Evening (Monthly: Volunteer Based).  $30 per visit, cash only  [place]  [phone] for adults; Children under age 29, call Graduate Pediatric Dentistry at [phone]. Children aged 4-14, please call [phone] to request a pediatric application.  Dental services are provided in all areas of dental care including fillings, crowns and bridges, complete and partial dentures, implants, gum treatment, root canals, and extractions. Preventive care is also provided. Treatment is provided to both adults and children. Patients are selected via a lottery and there is often a waiting list.   [facility] [address], [place]  [phone] [email or web address]   [facility] [address] [place], [place] [phone], Ext. [phone] Second and Fourth Thursday of each month, opens at 6:30 AM; Clinic ends at 9 AM.  Patients are seen on a first-come first-served basis, and a limited number are seen during each clinic.   [facility]  [address] Hillard Danker [place], [place] [phone]   Eligibility Requirements You must  have lived in [place], [place], or [place] counties for at least the last three months.   You cannot be eligible for state or federal sponsored [employer], including [employer], [place], or [employer].   You generally cannot be eligible for healthcare insurance through your employer.    How to apply: Eligibility screenings are held every Tuesday and Wednesday afternoon from 1:00 pm until 4:00 pm. You do not need an appointment for the interview!  [facility] [address], [place],  [place] Center Department  [place] Department  [place]  [phone]    Behavioral Health Resources in the Community: Intensive Outpatient Programs Organization         Address  Phone  Notes  [place]. [address], [place], [place] [phone]   [facility] Outpatient [address], [place], [place]   ADS: Alcohol & Drug Svcs [address] [place], [place]   [place] [address],  [place], [place] or [phone]   Substance Abuse Resources Organization         Address  Phone  Notes  Alcohol and Drug Services  [phone]   [place]  [phone]   [facility] [place]   Chinita Pester  [phone]   Residential & Outpatient Substance Abuse Program  [phone]   Psychological Services Organization         Address  Phone  Notes  [facility] [place]  [place]  [phone]   [place] [address] [place] or [phone]    Mobile Crisis Teams Organization         Address  Phone  Notes  [facility], Mobile Crisis Care Unit  [phone]   [facility] Psychotherapeutic Services  [address] [place], [place]   [place] [address], [place] [phone]    Self-Help/Support Groups Organization         Address  Phone             Notes  [place]. of [place] - variety of support groups  [place] Call for more information  Narcotics Anonymous (NA), [facility] [address], [employer] [place]  2 meetings at this location   Special educational needs teacher         Address  Phone  Notes  ASAP Residential Treatment [place],    [place]  [phone]   [facility]  [address] [place] [address], [place], [place]   [place], [place] [phone] Admissions: 8am-3pm M-F  Incentives Substance [place] [address],    [place], [place] [phone]   [facility] [address] [place], [place], [place]   [facility] [address]  [place], [place]   Insight Programs - Intensive Outpatient [address] Kristeen Mans [address], [employer], [place]   [facility] ([place].,  [place], [place] [phone] or [phone]   [facility] Services (RTS) [address] [place] [address], [place], [place] Accepts Medicaid  [facility] [place] [address],  [place] [phone] Substance Abuse/Addiction Treatment   [facility] Organization         Address  Phone  Notes  [facility]  [phone]   Domenic Schwab, PhD [address] Arlis Porta [place], [place]   [phone] or [phone]   [place], [place] [phone]   [place] [address], [place], [place] [phone] Insurance/Medicaid/sponsorship through [employer] and Families [address], [address] [address]  [place], [place] [phone] [place], [place] [phone]    Dr. Adele Schilder  [phone]   [facility] Dept. 1) [address] [place] 2) [place] 3)  [place] [address], [place] [phone]  [phone]   [place] [phone] or [phone] (After Hours)

## 2015-09-27 NOTE — Telephone Encounter (Signed)
Patient reports that she went to the ER over the weekend. They instructed her to continue her antibiotics and use percocet as needed for pain.  She will call back if her pain does not improve after the completion on the antibiotics

## 2015-11-08 ENCOUNTER — Ambulatory Visit: Payer: Medicaid Other | Admitting: Internal Medicine

## 2015-12-12 ENCOUNTER — Emergency Department (HOSPITAL_COMMUNITY): Payer: Medicaid Other

## 2015-12-12 ENCOUNTER — Encounter (HOSPITAL_COMMUNITY): Payer: Self-pay | Admitting: Nurse Practitioner

## 2015-12-12 ENCOUNTER — Emergency Department (HOSPITAL_COMMUNITY)
Admission: EM | Admit: 2015-12-12 | Discharge: 2015-12-12 | Disposition: A | Discharge status: Home / Self Care | Payer: Medicaid Other | Attending: Emergency Medicine | Admitting: Emergency Medicine

## 2015-12-12 DIAGNOSIS — Z79899 Other long term (current) drug therapy: Secondary | ICD-10-CM | POA: Insufficient documentation

## 2015-12-12 DIAGNOSIS — G8929 Other chronic pain: Secondary | ICD-10-CM | POA: Diagnosis not present

## 2015-12-12 DIAGNOSIS — R1084 Generalized abdominal pain: Secondary | ICD-10-CM | POA: Diagnosis not present

## 2015-12-12 DIAGNOSIS — R42 Dizziness and giddiness: Secondary | ICD-10-CM | POA: Diagnosis not present

## 2015-12-12 DIAGNOSIS — F419 Anxiety disorder, unspecified: Secondary | ICD-10-CM | POA: Insufficient documentation

## 2015-12-12 DIAGNOSIS — R110 Nausea: Secondary | ICD-10-CM | POA: Diagnosis not present

## 2015-12-12 DIAGNOSIS — R079 Chest pain, unspecified: Secondary | ICD-10-CM | POA: Diagnosis not present

## 2015-12-12 DIAGNOSIS — R197 Diarrhea, unspecified: Secondary | ICD-10-CM | POA: Diagnosis not present

## 2015-12-12 DIAGNOSIS — Z8719 Personal history of other diseases of the digestive system: Secondary | ICD-10-CM | POA: Insufficient documentation

## 2015-12-12 DIAGNOSIS — Z8541 Personal history of malignant neoplasm of cervix uteri: Secondary | ICD-10-CM | POA: Diagnosis not present

## 2015-12-12 DIAGNOSIS — Z8679 Personal history of other diseases of the circulatory system: Secondary | ICD-10-CM | POA: Insufficient documentation

## 2015-12-12 DIAGNOSIS — Z9851 Tubal ligation status: Secondary | ICD-10-CM | POA: Insufficient documentation

## 2015-12-12 DIAGNOSIS — R109 Unspecified abdominal pain: Secondary | ICD-10-CM | POA: Diagnosis present

## 2015-12-12 DIAGNOSIS — Z792 Long term (current) use of antibiotics: Secondary | ICD-10-CM | POA: Diagnosis not present

## 2015-12-12 DIAGNOSIS — Z86010 Personal history of colonic polyps: Secondary | ICD-10-CM | POA: Diagnosis not present

## 2015-12-12 DIAGNOSIS — F17210 Nicotine dependence, cigarettes, uncomplicated: Secondary | ICD-10-CM | POA: Insufficient documentation

## 2015-12-12 LAB — COMPREHENSIVE METABOLIC PANEL
ALK PHOS: 60 U/L (ref 38–126)
ALT: 16 U/L (ref 14–54)
AST: 16 U/L (ref 15–41)
Albumin: 3.9 g/dL (ref 3.5–5.0)
Anion gap: 9 (ref 5–15)
BILIRUBIN TOTAL: 0.6 mg/dL (ref 0.3–1.2)
BUN: 16 mg/dL (ref 6–20)
CALCIUM: 8.9 mg/dL (ref 8.9–10.3)
CO2: 24 mmol/L (ref 22–32)
CREATININE: 0.81 mg/dL (ref 0.44–1.00)
Chloride: 107 mmol/L (ref 101–111)
GFR calc non Af Amer: >60 mL/min (ref >60)
Glucose, Bld: 117 mg/dL — ABNORMAL HIGH (ref 65–99)
Potassium: 3.7 mmol/L (ref 3.5–5.1)
SODIUM: 140 mmol/L (ref 135–145)
TOTAL PROTEIN: 6.5 g/dL (ref 6.5–8.1)

## 2015-12-12 LAB — LIPASE, BLOOD: Lipase: 27 U/L (ref 11–51)

## 2015-12-12 LAB — CBC
HCT: 42.3 % (ref 36.0–46.0)
Hemoglobin: 14.8 g/dL (ref 12.0–15.0)
MCH: 31.5 pg (ref 26.0–34.0)
MCHC: 35 g/dL (ref 30.0–36.0)
MCV: 90 fL (ref 78.0–100.0)
PLATELETS: 204 10*3/uL (ref 150–400)
RBC: 4.7 MIL/uL (ref 3.87–5.11)
RDW: 12.4 % (ref 11.5–15.5)
WBC: 9.2 10*3/uL (ref 4.0–10.5)

## 2015-12-12 LAB — TROPONIN I

## 2015-12-12 MED ORDER — DICYCLOMINE HCL 20 MG PO TABS: 20.0000 mg | ORAL_TABLET | Freq: Two times a day (BID) | ORAL | Status: DC

## 2015-12-12 MED ORDER — IOHEXOL 300 MG/ML  SOLN
100.0000 mL | Freq: Once | INTRAMUSCULAR | Status: AC | PRN
  Administered 2015-12-12: 100 mL via INTRAVENOUS

## 2015-12-12 MED ORDER — DIPHENOXYLATE-ATROPINE 2.5-0.025 MG PO TABS: 2.0000 | ORAL_TABLET | Freq: Four times a day (QID) | ORAL | Status: DC | PRN

## 2015-12-12 MED ORDER — SODIUM CHLORIDE 0.9 % IV BOLUS (SEPSIS)
1000.0000 mL | Freq: Once | INTRAVENOUS | Status: AC
  Administered 2015-12-12: 1000 mL via INTRAVENOUS

## 2015-12-12 MED ORDER — SODIUM CHLORIDE 0.9 % IV BOLUS (SEPSIS)
500.0000 mL | Freq: Once | INTRAVENOUS | Status: AC
  Administered 2015-12-12: 500 mL via INTRAVENOUS

## 2015-12-12 MED ORDER — IOHEXOL 300 MG/ML  SOLN
50.0000 mL | Freq: Once | INTRAMUSCULAR | Status: AC | PRN
  Administered 2015-12-12: 50 mL via ORAL

## 2015-12-12 NOTE — Discharge Instructions (Signed)

## 2015-12-12 NOTE — ED Notes (Signed)
Pt is aware that a urine specimen is needed.

## 2015-12-12 NOTE — ED Notes (Signed)
Pt states today she had diarrhea and has been experiencing dizziness/feeling loopy and general malaise. Denies any other symptoms.
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
## 2015-12-31 ENCOUNTER — Ambulatory Visit: Payer: Medicaid Other | Admitting: Internal Medicine

## 2016-03-16 ENCOUNTER — Encounter: Payer: Self-pay | Admitting: Neurology

## 2016-03-16 ENCOUNTER — Ambulatory Visit (INDEPENDENT_AMBULATORY_CARE_PROVIDER_SITE_OTHER): Payer: Medicaid Other | Admitting: Neurology

## 2016-03-16 VITALS — BP 120/82 | HR 68 | Ht 64.5 in | Wt 202.5 lb

## 2016-03-16 DIAGNOSIS — F17200 Nicotine dependence, unspecified, uncomplicated: Secondary | ICD-10-CM

## 2016-03-16 DIAGNOSIS — M940 Chondrocostal junction syndrome [Tietze]: Secondary | ICD-10-CM

## 2016-03-16 DIAGNOSIS — R42 Dizziness and giddiness: Secondary | ICD-10-CM

## 2016-03-16 NOTE — Progress Notes (Signed)
Chart forwarded.  
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
## 2016-03-16 NOTE — Patient Instructions (Signed)
Your exam looks normal.  I don't suspect anything serious.  The dizziness may be related to anxiety.  Regardless, I would like to send you for vestibular rehabilitation.  Follow up with me in about 3 months but call sooner with any concerns.

## 2016-03-24 ENCOUNTER — Encounter: Payer: Self-pay | Admitting: Physical Therapy

## 2016-03-24 ENCOUNTER — Ambulatory Visit: Payer: Medicaid Other | Attending: Neurology | Admitting: Physical Therapy

## 2016-03-24 DIAGNOSIS — R42 Dizziness and giddiness: Secondary | ICD-10-CM | POA: Diagnosis present

## 2016-03-24 NOTE — Patient Instructions (Signed)
Tip Card 1.The goal of habituation training is to assist in decreasing symptoms of vertigo, dizziness, or nausea provoked by specific head and body motions. 2.These exercises may initially increase symptoms; however, be persistent and work through symptoms. With repetition and time, the exercises will assist in reducing or eliminating symptoms. 3.Exercises should be stopped and discussed with the therapist if you experience any of the following: - Sudden change or fluctuation in hearing - New onset of ringing in the ears, or increase in current intensity - Any fluid discharge from the ear - Severe pain in neck or back - Extreme nausea  Copyright  VHI. All rights reserved.  Sit to Side-Lying   Sit on edge of bed. Lie down onto the right side and hold until dizziness stops, plus 20 seconds.  Return to sitting and wait until dizziness stops, plus 20 seconds.  Repeat to the left side. Repeat sequence 5 times per session. Do 2 sessions per day.  Copyright  VHI. All rights reserved.   
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
## 2016-03-31 ENCOUNTER — Ambulatory Visit: Payer: Medicaid Other | Admitting: Physical Therapy

## 2016-04-07 ENCOUNTER — Encounter: Payer: Medicaid Other | Admitting: Physical Therapy

## 2016-04-14 ENCOUNTER — Encounter: Payer: Medicaid Other | Admitting: Physical Therapy

## 2016-04-21 ENCOUNTER — Encounter: Payer: Medicaid Other | Admitting: Physical Therapy

## 2016-04-21 ENCOUNTER — Emergency Department (HOSPITAL_COMMUNITY)
Admission: EM | Admit: 2016-04-21 | Discharge: 2016-04-21 | Disposition: A | Discharge status: Home / Self Care | Payer: Medicaid Other | Attending: Emergency Medicine | Admitting: Emergency Medicine

## 2016-04-21 ENCOUNTER — Emergency Department (HOSPITAL_COMMUNITY): Payer: Medicaid Other

## 2016-04-21 ENCOUNTER — Encounter (HOSPITAL_COMMUNITY): Payer: Self-pay | Admitting: Emergency Medicine

## 2016-04-21 DIAGNOSIS — R1084 Generalized abdominal pain: Secondary | ICD-10-CM

## 2016-04-21 DIAGNOSIS — Z79899 Other long term (current) drug therapy: Secondary | ICD-10-CM | POA: Insufficient documentation

## 2016-04-21 DIAGNOSIS — R079 Chest pain, unspecified: Secondary | ICD-10-CM | POA: Diagnosis not present

## 2016-04-21 DIAGNOSIS — R112 Nausea with vomiting, unspecified: Secondary | ICD-10-CM | POA: Insufficient documentation

## 2016-04-21 DIAGNOSIS — F17210 Nicotine dependence, cigarettes, uncomplicated: Secondary | ICD-10-CM | POA: Insufficient documentation

## 2016-04-21 LAB — CBC
HCT: 42 % (ref 36.0–46.0)
Hemoglobin: 14.4 g/dL (ref 12.0–15.0)
MCH: 31.4 pg (ref 26.0–34.0)
MCHC: 34.3 g/dL (ref 30.0–36.0)
MCV: 91.5 fL (ref 78.0–100.0)
PLATELETS: 170 10*3/uL (ref 150–400)
RBC: 4.59 MIL/uL (ref 3.87–5.11)
RDW: 13.2 % (ref 11.5–15.5)
WBC: 6.6 10*3/uL (ref 4.0–10.5)

## 2016-04-21 LAB — URINALYSIS, ROUTINE W REFLEX MICROSCOPIC
BILIRUBIN URINE: NEGATIVE
GLUCOSE, UA: NEGATIVE mg/dL
Hgb urine dipstick: NEGATIVE
KETONES UR: NEGATIVE mg/dL
Leukocytes, UA: NEGATIVE
NITRITE: NEGATIVE
PH: 7.5 (ref 5.0–8.0)
Protein, ur: NEGATIVE mg/dL
Specific Gravity, Urine: 1.011 (ref 1.005–1.030)

## 2016-04-21 LAB — HEPATIC FUNCTION PANEL
ALBUMIN: 3.8 g/dL (ref 3.5–5.0)
ALT: 13 U/L — ABNORMAL LOW (ref 14–54)
AST: 15 U/L (ref 15–41)
Alkaline Phosphatase: 55 U/L (ref 38–126)
BILIRUBIN DIRECT: 0.2 mg/dL (ref 0.1–0.5)
BILIRUBIN TOTAL: 1 mg/dL (ref 0.3–1.2)
Indirect Bilirubin: 0.8 mg/dL (ref 0.3–0.9)
Total Protein: 6.1 g/dL — ABNORMAL LOW (ref 6.5–8.1)

## 2016-04-21 LAB — BASIC METABOLIC PANEL
Anion gap: 5 (ref 5–15)
BUN: 8 mg/dL (ref 6–20)
CALCIUM: 8.8 mg/dL — AB (ref 8.9–10.3)
CO2: 26 mmol/L (ref 22–32)
CREATININE: 0.78 mg/dL (ref 0.44–1.00)
Chloride: 112 mmol/L — ABNORMAL HIGH (ref 101–111)
Glucose, Bld: 103 mg/dL — ABNORMAL HIGH (ref 65–99)
Potassium: 3.8 mmol/L (ref 3.5–5.1)
SODIUM: 143 mmol/L (ref 135–145)

## 2016-04-21 LAB — I-STAT TROPONIN, ED: TROPONIN I, POC: 0 ng/mL (ref 0.00–0.08)

## 2016-04-21 MED ORDER — PANTOPRAZOLE SODIUM 40 MG IV SOLR
40.0000 mg | Freq: Once | INTRAVENOUS | Status: AC
  Administered 2016-04-21: 40 mg via INTRAVENOUS
  Filled 2016-04-21: qty 40

## 2016-04-21 MED ORDER — DICYCLOMINE HCL 20 MG PO TABS: ORAL_TABLET | ORAL | Status: DC

## 2016-04-21 MED ORDER — MORPHINE SULFATE (PF) 4 MG/ML IV SOLN
4.0000 mg | INTRAVENOUS | Status: DC | PRN
  Administered 2016-04-21: 4 mg via INTRAVENOUS
  Filled 2016-04-21: qty 1

## 2016-04-21 MED ORDER — SODIUM CHLORIDE 0.9 % IV BOLUS (SEPSIS)
1000.0000 mL | Freq: Once | INTRAVENOUS | Status: AC
  Administered 2016-04-21: 1000 mL via INTRAVENOUS

## 2016-04-21 MED ORDER — ONDANSETRON HCL 4 MG/2ML IJ SOLN
4.0000 mg | Freq: Once | INTRAMUSCULAR | Status: AC
  Administered 2016-04-21: 4 mg via INTRAVENOUS
  Filled 2016-04-21: qty 2

## 2016-04-21 MED ORDER — OMEPRAZOLE 20 MG PO CPDR: 20.0000 mg | DELAYED_RELEASE_CAPSULE | Freq: Two times a day (BID) | ORAL | Status: DC

## 2016-04-21 MED ORDER — ONDANSETRON 4 MG PO TBDP
4.0000 mg | ORAL_TABLET | Freq: Three times a day (TID) | ORAL | Status: DC | PRN
Start: 2016-04-21 — End: 2016-07-05

## 2016-04-21 NOTE — Discharge Instructions (Signed)
Abdominal Pain, Adult Many things can cause abdominal pain. Usually, abdominal pain is not caused by a disease and will improve without treatment. It can often be observed and treated at home. Your health care provider will do a physical exam and possibly order blood tests and X-rays to help determine the seriousness of your pain. However, in many cases, more time must pass before a clear cause of the pain can be found. Before that point, your health care provider may not know if you need more testing or further treatment. HOME CARE INSTRUCTIONS Monitor your abdominal pain for any changes. The following actions may help to alleviate any discomfort you are experiencing:  Only take over-the-counter or prescription medicines as directed by your health care provider.  Do not take laxatives unless directed to do so by your health care provider.  Try a clear liquid diet (broth, tea, or water) as directed by your health care provider. Slowly move to a bland diet as tolerated. SEEK MEDICAL CARE IF:  You have unexplained abdominal pain.  You have abdominal pain associated with nausea or diarrhea.  You have pain when you urinate or have a bowel movement.  You experience abdominal pain that wakes you in the night.  You have abdominal pain that is worsened or improved by eating food.  You have abdominal pain that is worsened with eating fatty foods.  You have a fever. SEEK IMMEDIATE MEDICAL CARE IF:  Your pain does not go away within 2 hours.  You keep throwing up (vomiting).  Your pain is felt only in portions of the abdomen, such as the right side or the left lower portion of the abdomen.  You pass bloody or black tarry stools. MAKE SURE YOU:  Understand these instructions.  Will watch your condition.  Will get help right away if you are not doing well or get worse.   This information is not intended to replace advice given to you by your health care provider. Make sure you discuss  any questions you have with your health care provider.   Document Released: 08/30/2005 Document Revised: 08/11/2015 Document Reviewed: 07/30/2013 Elsevier Interactive Patient Education 2016 Elsevier Inc.  Nausea and Vomiting Nausea means you feel sick to your stomach. Throwing up (vomiting) is a reflex where stomach contents come out of your mouth. HOME CARE   Take medicine as told by your doctor.  Do not force yourself to eat. However, you do need to drink fluids.  If you feel like eating, eat a normal diet as told by your doctor.  Eat rice, wheat, potatoes, bread, lean meats, yogurt, fruits, and vegetables.  Avoid high-fat foods.  Drink enough fluids to keep your pee (urine) clear or pale yellow.  Ask your doctor how to replace body fluid losses (rehydrate). Signs of body fluid loss (dehydration) include:  Feeling very thirsty.  Dry lips and mouth.  Feeling dizzy.  Dark pee.  Peeing less than normal.  Feeling confused.  Fast breathing or heart rate. GET HELP RIGHT AWAY IF:   You have blood in your throw up.  You have black or bloody poop (stool).  You have a bad headache or stiff neck.  You feel confused.  You have bad belly (abdominal) pain.  You have chest pain or trouble breathing.  You do not pee at least once every 8 hours.  You have cold, clammy skin.  You keep throwing up after 24 to 48 hours.  You have a fever. MAKE SURE YOU:  Understand these instructions.  Will watch your condition.  Will get help right away if you are not doing well or get worse.   This information is not intended to replace advice given to you by your health care provider. Make sure you discuss any questions you have with your health care provider.   Document Released: 05/08/2008 Document Revised: 02/12/2012 Document Reviewed: 04/21/2011 Elsevier Interactive Patient Education [employer].

## 2016-04-21 NOTE — ED Notes (Signed)
MD at bedside. 

## 2016-04-21 NOTE — ED Notes (Signed)
Discharge instructions, follow up care, and rx x3 reviewed with patient. Patient verbalized understanding. 

## 2016-04-21 NOTE — ED Notes (Signed)
Patient states she does not want any more morphine

## 2016-04-21 NOTE — ED Notes (Signed)
Pt c/o epigastric/chest pain, sharp, starting yesterday continuing into today. C/o nausea. Denies vomiting, SOB, radiation of pain. Crying in triage. Only ate a biscuit for breakfast this morning. Says the last time she felt like this they dx her with costrocondritis.
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
## 2016-06-04 ENCOUNTER — Encounter (HOSPITAL_COMMUNITY): Payer: Self-pay | Admitting: Emergency Medicine

## 2016-06-04 ENCOUNTER — Emergency Department (HOSPITAL_COMMUNITY)
Admission: EM | Admit: 2016-06-04 | Discharge: 2016-06-04 | Disposition: A | Discharge status: Home / Self Care | Payer: Medicaid Other | Attending: Emergency Medicine | Admitting: Emergency Medicine

## 2016-06-04 DIAGNOSIS — M79604 Pain in right leg: Secondary | ICD-10-CM

## 2016-06-04 DIAGNOSIS — M79661 Pain in right lower leg: Secondary | ICD-10-CM | POA: Diagnosis not present

## 2016-06-04 DIAGNOSIS — Z8541 Personal history of malignant neoplasm of cervix uteri: Secondary | ICD-10-CM | POA: Insufficient documentation

## 2016-06-04 DIAGNOSIS — Z79899 Other long term (current) drug therapy: Secondary | ICD-10-CM | POA: Diagnosis not present

## 2016-06-04 DIAGNOSIS — F17210 Nicotine dependence, cigarettes, uncomplicated: Secondary | ICD-10-CM | POA: Insufficient documentation

## 2016-06-04 DIAGNOSIS — M25571 Pain in right ankle and joints of right foot: Secondary | ICD-10-CM | POA: Diagnosis present

## 2016-06-04 LAB — BASIC METABOLIC PANEL
ANION GAP: 8 (ref 5–15)
BUN: 6 mg/dL (ref 6–20)
CALCIUM: 9 mg/dL (ref 8.9–10.3)
CO2: 24 mmol/L (ref 22–32)
Chloride: 109 mmol/L (ref 101–111)
Creatinine, Ser: 0.79 mg/dL (ref 0.44–1.00)
GFR calc non Af Amer: >60 mL/min (ref >60)
Glucose, Bld: 99 mg/dL (ref 65–99)
Potassium: 3.7 mmol/L (ref 3.5–5.1)
SODIUM: 141 mmol/L (ref 135–145)

## 2016-06-04 LAB — CBC WITH DIFFERENTIAL/PLATELET
BASOS ABS: 0 10*3/uL (ref 0.0–0.1)
BASOS PCT: 1 %
Eosinophils Absolute: 0.2 10*3/uL (ref 0.0–0.7)
Eosinophils Relative: 4 %
HEMATOCRIT: 44.9 % (ref 36.0–46.0)
HEMOGLOBIN: 15 g/dL (ref 12.0–15.0)
Lymphocytes Relative: 31 %
Lymphs Abs: 1.8 10*3/uL (ref 0.7–4.0)
MCH: 31.1 pg (ref 26.0–34.0)
MCHC: 33.4 g/dL (ref 30.0–36.0)
MCV: 93 fL (ref 78.0–100.0)
MONO ABS: 0.6 10*3/uL (ref 0.1–1.0)
Monocytes Relative: 11 %
NEUTROS ABS: 3.2 10*3/uL (ref 1.7–7.7)
NEUTROS PCT: 55 %
Platelets: 183 10*3/uL (ref 150–400)
RBC: 4.83 MIL/uL (ref 3.87–5.11)
RDW: 12.8 % (ref 11.5–15.5)
WBC: 5.8 10*3/uL (ref 4.0–10.5)

## 2016-06-04 LAB — D-DIMER, QUANTITATIVE (NOT AT ARMC): D DIMER QUANT: 0.4 ug{FEU}/mL (ref 0.00–0.50)

## 2016-06-04 MED ORDER — ONDANSETRON HCL 4 MG PO TABS: 4.0000 mg | ORAL_TABLET | Freq: Four times a day (QID) | ORAL | Status: DC

## 2016-06-04 MED ORDER — ONDANSETRON 4 MG PO TBDP
4.0000 mg | ORAL_TABLET | Freq: Once | ORAL | Status: AC
  Administered 2016-06-04: 4 mg via ORAL
  Filled 2016-06-04: qty 1

## 2016-06-04 MED ORDER — OXYCODONE-ACETAMINOPHEN 5-325 MG PO TABS
1.0000 | ORAL_TABLET | Freq: Once | ORAL | Status: AC
  Administered 2016-06-04: 1 via ORAL
  Filled 2016-06-04: qty 1

## 2016-06-04 MED ORDER — CEPHALEXIN 250 MG PO CAPS
500.0000 mg | ORAL_CAPSULE | Freq: Once | ORAL | Status: AC
  Administered 2016-06-04: 500 mg via ORAL
  Filled 2016-06-04: qty 2

## 2016-06-04 MED ORDER — OXYCODONE-ACETAMINOPHEN 5-325 MG PO TABS: 1.0000 | ORAL_TABLET | ORAL | Status: DC | PRN

## 2016-06-04 MED ORDER — CEPHALEXIN 500 MG PO CAPS: 500.0000 mg | ORAL_CAPSULE | Freq: Four times a day (QID) | ORAL | Status: DC
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
## 2016-06-04 NOTE — Discharge Instructions (Signed)
RETURN TOMORROW FOR DOPPLER STUDY AS INSTRUCTED. TAKE MEDICATIONS AS PRESCRIBED. YOU SHOULD FOLLOW UP WITH YOUR DOCTOR IN 2-3 DAYS FOR RECHECK OF THE LEG. RETURN TO THE EMERGENCY DEPARTMENT WITH ANY HIGH FEVER, SEVERE PAIN, SEVERE SWELLING OR NEW CONCERN.

## 2016-06-04 NOTE — ED Notes (Signed)
Pt. reports right ankle pain with redness onset Thursday , denies injury / ambulatory .

## 2016-06-04 NOTE — ED Notes (Signed)
Pt given crackers and gingerale.

## 2016-06-05 ENCOUNTER — Ambulatory Visit (HOSPITAL_COMMUNITY)
Admission: RE | Admit: 2016-06-05 | Discharge: 2016-06-05 | Disposition: A | Discharge status: Home / Self Care | Payer: Medicaid Other | Source: Ambulatory Visit | Attending: Emergency Medicine | Admitting: Emergency Medicine

## 2016-06-05 DIAGNOSIS — M7989 Other specified soft tissue disorders: Secondary | ICD-10-CM | POA: Insufficient documentation

## 2016-06-05 NOTE — Progress Notes (Signed)
Preliminary results by tech - Right Lower Ext. Venous Duplex Completed. Negative for deep and superficial vein thrombosis in the right leg.  Zenobia Kuennen, BS, RDMS, RVT  

## 2016-06-05 NOTE — ED Provider Notes (Signed)
MSE was initiated and I personally evaluated the patient and placed orders (if any) at  12:12 AM on June 05, 2016.  S Patient presenting with right posterior calf pain and swelling over the past 3 days. Pain radiates up the leg into her thigh. Also notes an area of erythema to the distal medial leg. Recent history of long road trip to the beach. No history of DVTs. Denies chest pain or shortness of breath.  O Generalized tenderness to palpation of the posterior right calf. Area of erythema noted to the medial distal lower leg. Minor unilateral calf swelling. Right leg is neurovascularly intact with full range of motion.  A With recent long car ride, lower extremity swelling, posterior tenderness and erythema, there is a concern for possible DVT.  P Screening labs and d-dimer ordered. Patient was moved to the acute side of the emergency department for further evaluation.  The patient appears stable so that the remainder of the MSE may be completed by another provider.  Lahoma Crocker Payten Beaumier, PA-C 06/05/16 0017  Gareth Morgan, MD 06/05/16 1238

## 2016-07-05 ENCOUNTER — Ambulatory Visit (INDEPENDENT_AMBULATORY_CARE_PROVIDER_SITE_OTHER): Payer: Medicaid Other | Admitting: Neurology

## 2016-07-05 ENCOUNTER — Encounter: Payer: Self-pay | Admitting: Neurology

## 2016-07-05 VITALS — BP 120/88 | HR 72 | Ht 64.0 in | Wt 203.0 lb

## 2016-07-05 DIAGNOSIS — F17200 Nicotine dependence, unspecified, uncomplicated: Secondary | ICD-10-CM

## 2016-07-05 DIAGNOSIS — M5417 Radiculopathy, lumbosacral region: Secondary | ICD-10-CM | POA: Diagnosis not present

## 2016-07-05 DIAGNOSIS — M5416 Radiculopathy, lumbar region: Secondary | ICD-10-CM

## 2016-07-05 MED ORDER — DIAZEPAM 5 MG PO TABS: ORAL_TABLET | ORAL | 0 refills | Status: DC

## 2016-07-05 NOTE — Progress Notes (Signed)
Note routed to [place], [place].
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
## 2016-07-05 NOTE — Patient Instructions (Addendum)
1.  Will get MRI of lumbar spine to look for any disc bulge that may be pressing on a nerve. We have sent a referral to [facility] for your MRI and they will call you directly to schedule your appt. They are located at [place]. If you need to contact them directly please call [phone]. 2.  Follow up in 3 months.  May need to refer you to pain management for possible epidural injection

## 2016-07-11 ENCOUNTER — Telehealth: Payer: Self-pay

## 2016-07-11 DIAGNOSIS — M5417 Radiculopathy, lumbosacral region: Secondary | ICD-10-CM

## 2016-07-11 NOTE — Telephone Encounter (Signed)
Received fax from [facility] that requested MRI Lumbar Spine w/o contrast has been denied. Fax states that MRI was denied because:   "Based on [employer] Spine Imaging Guidelines, we are unable to approve the requested procedure. Spinal imaging is not generally necessary during the first six weeks of symptoms except when a "red Flag" finding is noted. MRI might be supported in evaluation of suspected or known spinal disease either one of the following: 1) Failure to improve after a recent 6 week trial of physician-guided clinical care (treamtent or observation)weiht clinical re-evaluation, or 2?any signs or symptoms such as significant motor weakness, recent malignancy or infection, cauda equina syndrome, for which conservative treatment isn ot needed. The clinical information we reviewed fails to support meeting these requirements and, therefore, the requested procedure is not indicated at this time"  A request for an appeal can be made. This must be done within 30 days and will require an court  Hearing. Please advise

## 2016-07-11 NOTE — Telephone Encounter (Signed)
She will need to see physical therapy first.  We can also start gabapentin 300mg  at bedtime.  If pain not better in 1 week, she can increase gabapentin to 300mg  twice daily for 1 week and then 300mg  three times daily in 1 week if needed.

## 2016-07-11 NOTE — Telephone Encounter (Signed)
Relayed message to patient. Pt denies gabapentin. Will allow referral for P.T. To be placed, however, pt states that her insurance wouldn't cover PT for Vestibular rehab previously.

## 2016-07-25 ENCOUNTER — Inpatient Hospital Stay: Admission: RE | Admit: 2016-07-25 | Payer: Medicaid Other | Source: Ambulatory Visit

## 2016-09-29 ENCOUNTER — Other Ambulatory Visit: Payer: Self-pay | Admitting: Surgery

## 2016-09-29 DIAGNOSIS — E042 Nontoxic multinodular goiter: Secondary | ICD-10-CM

## 2016-10-03 ENCOUNTER — Other Ambulatory Visit: Payer: Medicaid Other

## 2016-10-05 ENCOUNTER — Telehealth: Payer: Self-pay | Admitting: Neurology

## 2016-10-05 MED ORDER — GABAPENTIN 300 MG PO CAPS: 300.0000 mg | ORAL_CAPSULE | Freq: Every day | ORAL | 1 refills | Status: DC

## 2016-10-05 NOTE — Telephone Encounter (Signed)
Please see message from front desk staff. Denial form insurance is documented on 07/11/16's telephone encounter. Please advise.

## 2016-10-05 NOTE — Telephone Encounter (Signed)
Message relayed to patient. Verbalized understanding and denied questions. Pt will pick up medication. Aware to call in 1 week.

## 2016-10-05 NOTE — Telephone Encounter (Signed)
She needs treatment before an MRI is approved.  Ideally, I would like her to get PT, however Medicaid often does not approve this.  Therefore, if PT is not an option, I recommend starting gabapentin 300mg  at bedtime.  She should follow contact us with update in one week and we can increase dose further if needed.

## 2016-10-05 NOTE — Telephone Encounter (Signed)
Patient canceled her appt to see Dr Tomi Likens on 10-10-16 due to the patient not having her MRI because Ins would not approve

## 2016-10-09 ENCOUNTER — Other Ambulatory Visit: Payer: Medicaid Other

## 2016-10-10 ENCOUNTER — Ambulatory Visit: Payer: Medicaid Other | Admitting: Neurology

## 2016-10-22 IMAGING — CT CT ABD-PELV W/ CM
2 of 5 series · 17 of 46 positions shown, 19 images · IV contrast (OMNIPAQUE 300)
Comparison: CT of the abdomen and pelvis from 09/19/2015, and
pelvic ultrasound performed 09/10/2015

CLINICAL DATA: Acute onset of intermittent diffuse cramping
abdominal pain. Nausea, dry skin and diarrhea. Dizziness and
myalgias. Initial encounter.

EXAM:
CT ABDOMEN AND PELVIS WITH CONTRAST
TECHNIQUE: Multidetector CT imaging of the abdomen and pelvis was performed
using the standard protocol following bolus administration of
intravenous contrast.
CONTRAST:  100mL OMNIPAQUE IOHEXOL 300 MG/ML  SOLN

[Series 2: abd/pel with · axial · 0.86mm/px · z∈[-460,-105]mm · 14 of 81 slices shown, 16 images]
[im 5/81  soft-tissue]
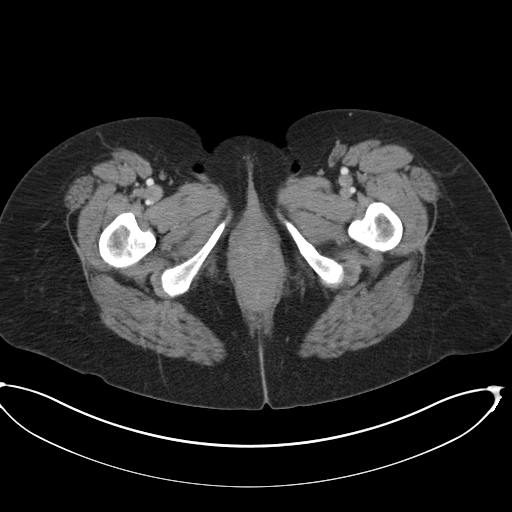
[im 5/81  bone]
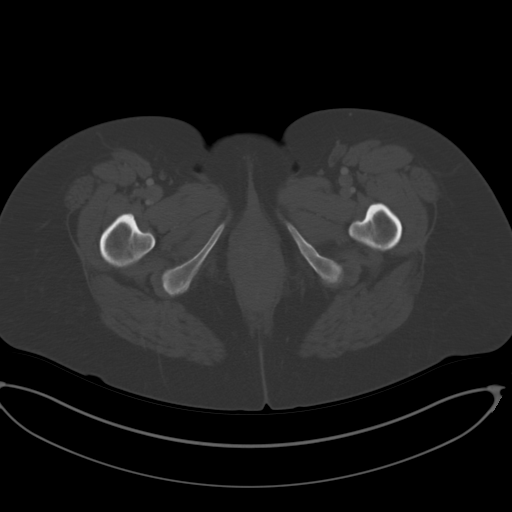
[im 9/81  soft-tissue]
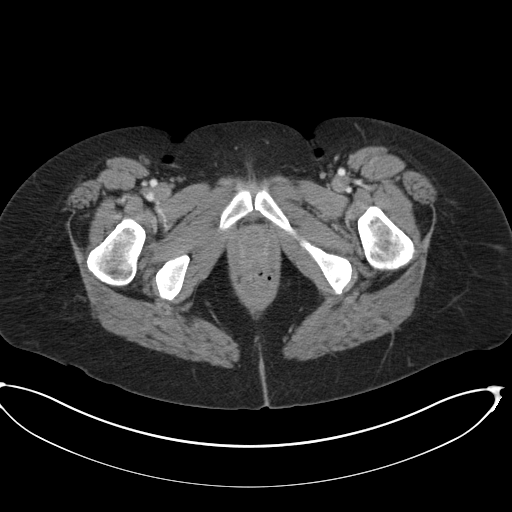
[im 18/81  soft-tissue]
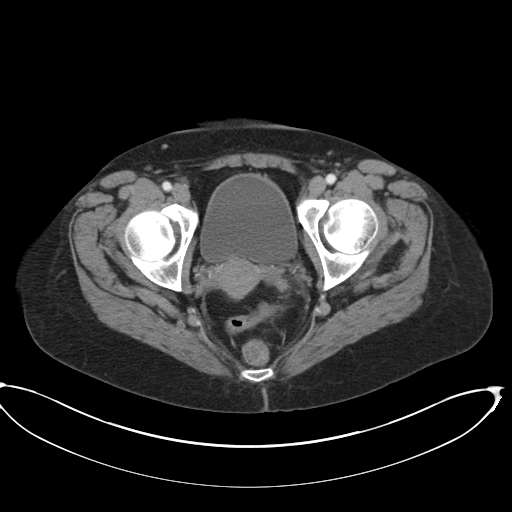
[im 23/81  soft-tissue]
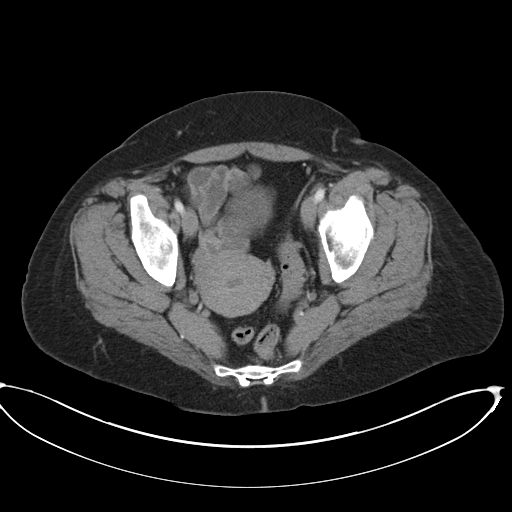
[im 27/81  soft-tissue]
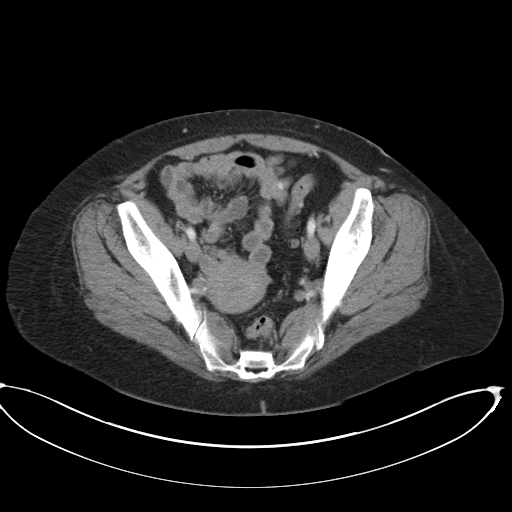
[im 32/81  soft-tissue]
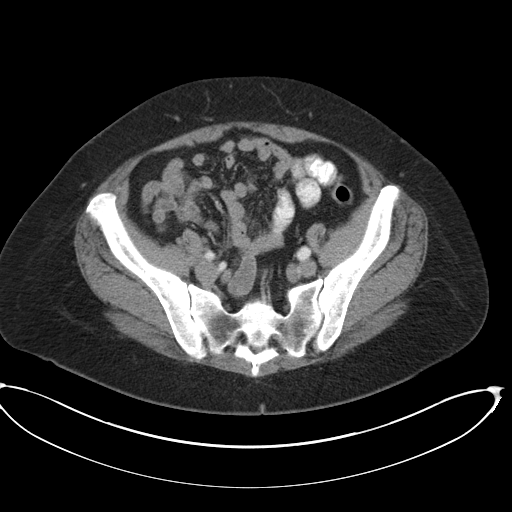
[im 36/81  soft-tissue]
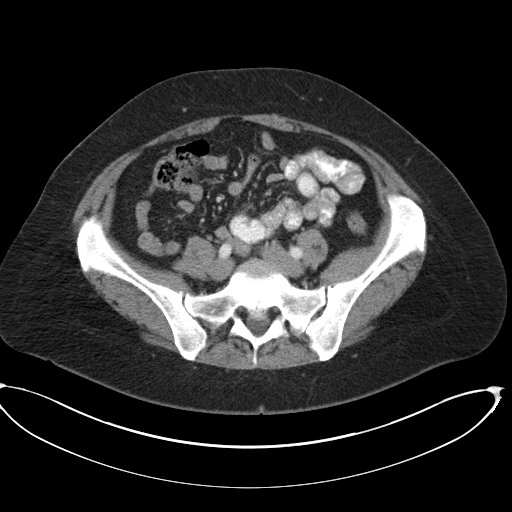
[im 45/81  soft-tissue]
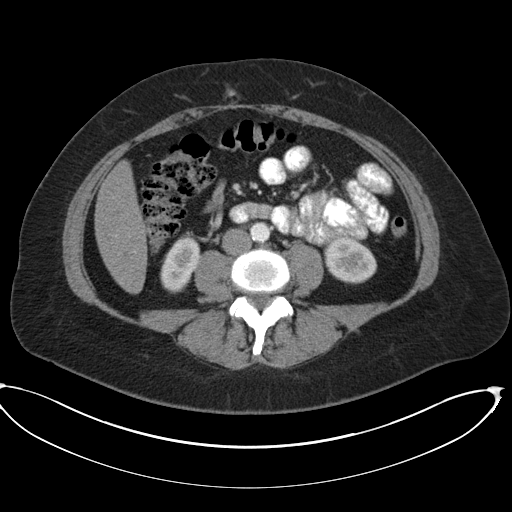
[im 49/81  soft-tissue]
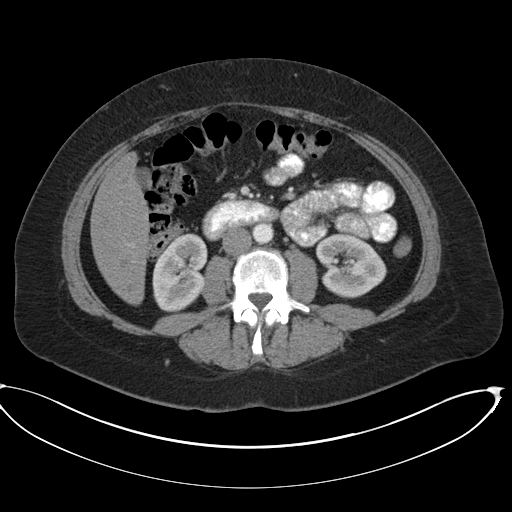
[im 49/81  bone]
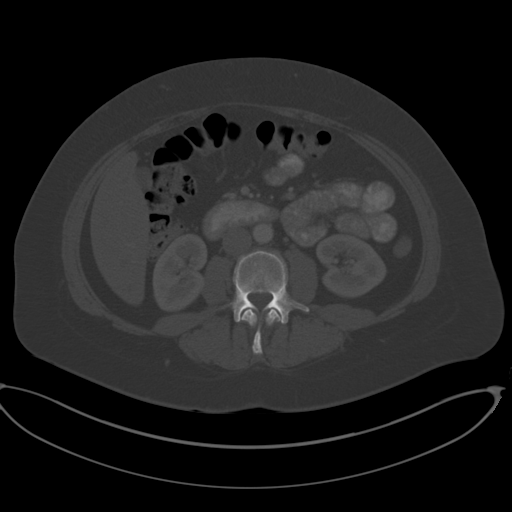
[im 54/81  soft-tissue]
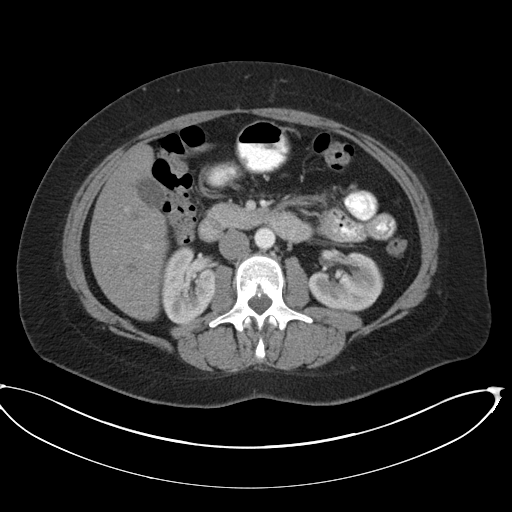
[im 58/81  soft-tissue]
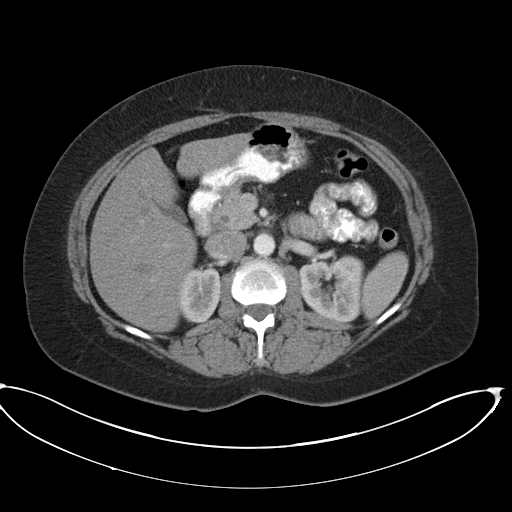
[im 63/81  soft-tissue]
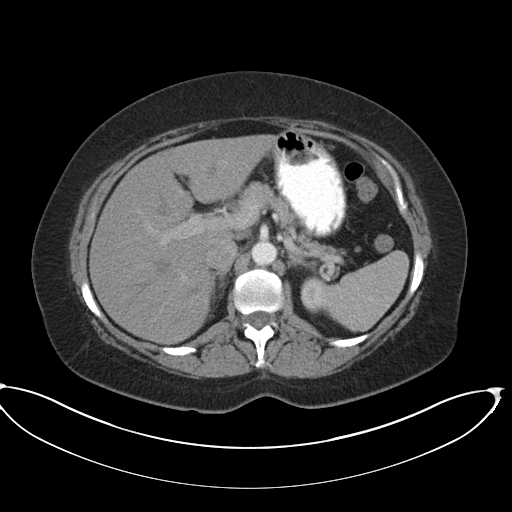
[im 72/81  soft-tissue]
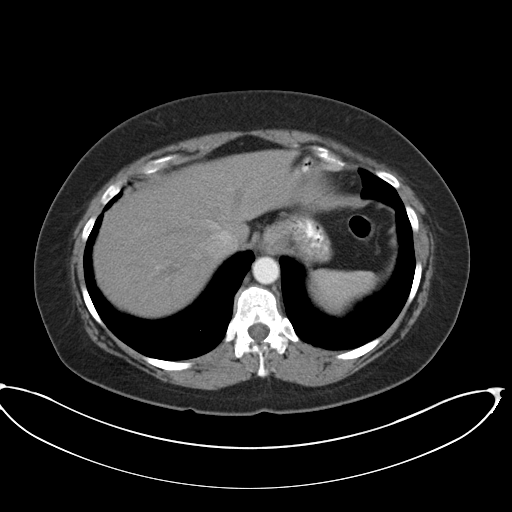
[im 76/81  soft-tissue]
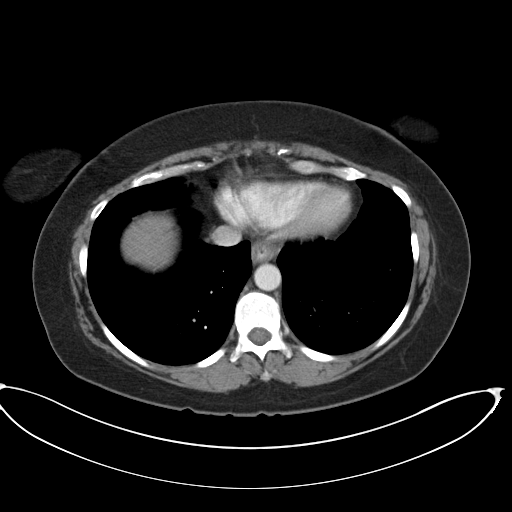

[Series 5: coronal a/|p · coronal · 0.74mm/px · 3 of 100 slices shown]
[im 34/100  soft-tissue]
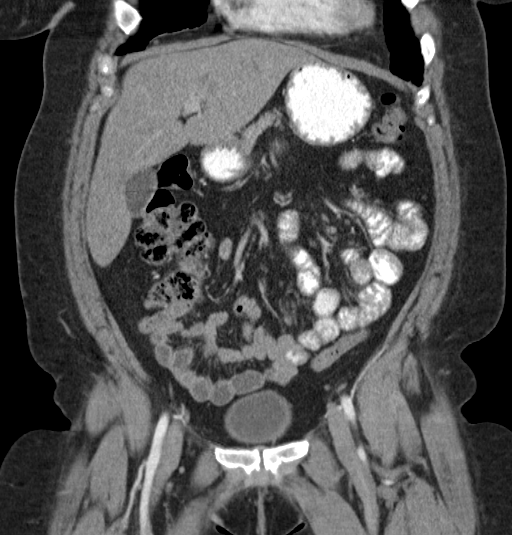
[im 45/100  soft-tissue]
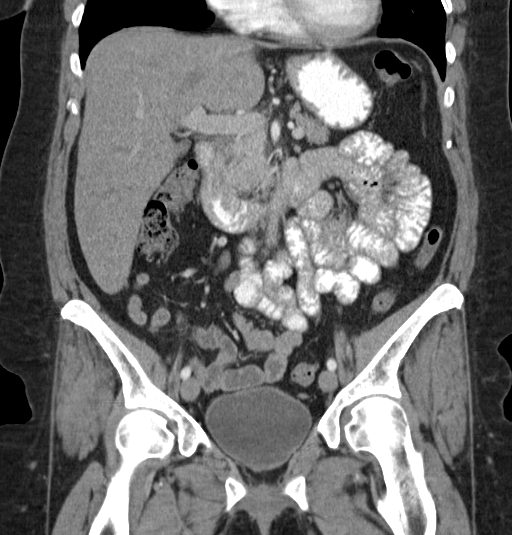
[im 56/100  soft-tissue]
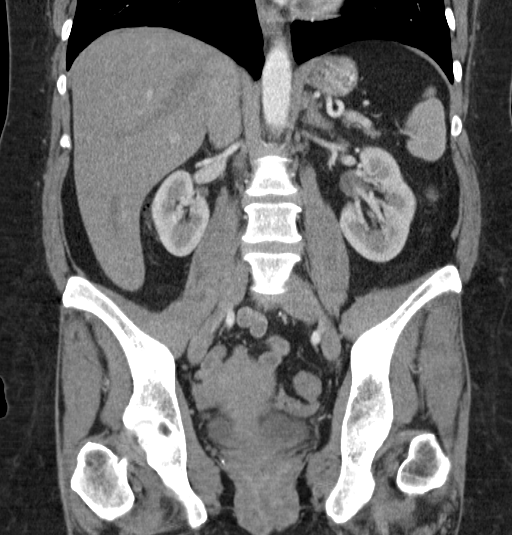

[17 of 46 positions shown; findings below may reference images not displayed]

FINDINGS: The visualized lung bases are clear.

The liver and spleen are unremarkable in appearance. The gallbladder
is within normal limits. The pancreas and adrenal glands are
unremarkable.

The kidneys are unremarkable in appearance. There is no evidence of
hydronephrosis. No renal or ureteral stones are seen. No perinephric
stranding is appreciated.

No free fluid is identified. The small bowel is unremarkable in
appearance. The stomach is within normal limits. No acute vascular
abnormalities are seen.

The appendix is normal in caliber, without evidence of appendicitis.
The colon is grossly unremarkable in appearance.

The bladder is mildly distended and grossly unremarkable. The uterus
is within normal limits. The ovaries are relatively symmetric. No
suspicious adnexal masses are seen. No inguinal lymphadenopathy is
seen.

No acute osseous abnormalities are identified.
IMPRESSION: Unremarkable contrast-enhanced CT of the abdomen and pelvis.

## 2016-10-30 ENCOUNTER — Telehealth: Payer: Self-pay | Admitting: Neurology

## 2016-10-30 MED ORDER — GABAPENTIN 300 MG PO CAPS: ORAL_CAPSULE | ORAL | 1 refills | Status: DC

## 2016-10-30 NOTE — Telephone Encounter (Signed)
PT called and said Dr Tomi Likens called in some Gabapentin and she said nothing has changed/Dawn CB# [phone]

## 2016-10-30 NOTE — Telephone Encounter (Signed)
she can increase gabapentin to 300mg  twice daily for 1 week and then 300mg  three times daily.  She should contact us in 3 to 4 weeks with update.

## 2016-10-30 NOTE — Telephone Encounter (Signed)
RX sent in. Mychart message sent to patient. Attempted to call however no vm had been set up to leave a message one.

## 2016-10-30 NOTE — Telephone Encounter (Signed)
Pt started on gabapentin 300mg  at bedtime on 10/05/16. No change since starting medication. Please advise.

## 2016-11-01 ENCOUNTER — Telehealth: Payer: Self-pay | Admitting: Neurology

## 2016-11-01 NOTE — Telephone Encounter (Signed)
Pt was read message from yesterday, see mychart encounter. Pt verbalized understanding, denied questions.

## 2016-11-01 NOTE — Telephone Encounter (Signed)
Christine Shah 2066/08/27. She was calling regarding her medication (Gabapentin) that Dr. Tomi Likens started her on. She said she is not doing well on it. Her # is [phone]. Thank you

## 2017-01-06 ENCOUNTER — Emergency Department (HOSPITAL_COMMUNITY)
Admission: EM | Admit: 2017-01-06 | Discharge: 2017-01-06 | Disposition: A | Discharge status: Home / Self Care | Payer: Medicaid Other | Attending: Emergency Medicine | Admitting: Emergency Medicine

## 2017-01-06 ENCOUNTER — Emergency Department (HOSPITAL_COMMUNITY): Payer: Medicaid Other

## 2017-01-06 ENCOUNTER — Encounter (HOSPITAL_COMMUNITY): Payer: Self-pay | Admitting: Emergency Medicine

## 2017-01-06 DIAGNOSIS — F17210 Nicotine dependence, cigarettes, uncomplicated: Secondary | ICD-10-CM | POA: Diagnosis not present

## 2017-01-06 DIAGNOSIS — Z79899 Other long term (current) drug therapy: Secondary | ICD-10-CM | POA: Insufficient documentation

## 2017-01-06 DIAGNOSIS — Z8541 Personal history of malignant neoplasm of cervix uteri: Secondary | ICD-10-CM | POA: Insufficient documentation

## 2017-01-06 DIAGNOSIS — J069 Acute upper respiratory infection, unspecified: Secondary | ICD-10-CM | POA: Diagnosis not present

## 2017-01-06 DIAGNOSIS — J029 Acute pharyngitis, unspecified: Secondary | ICD-10-CM | POA: Diagnosis present

## 2017-01-06 MED ORDER — PSEUDOEPHEDRINE HCL ER 120 MG PO TB12: 120.0000 mg | ORAL_TABLET | Freq: Two times a day (BID) | ORAL | 0 refills | Status: DC

## 2017-01-06 MED ORDER — NAPROXEN 500 MG PO TABS: 500.0000 mg | ORAL_TABLET | Freq: Two times a day (BID) | ORAL | 0 refills | Status: DC

## 2017-01-06 NOTE — Discharge Instructions (Signed)
Take the medications as needed to help with discomfort and nasal congestion. Continue your current medications. Follow-up with your primary doctor next week if the symptoms have not resolved

## 2017-01-06 NOTE — ED Notes (Signed)
Bed: WA19 Expected date:  Expected time:  Means of arrival:  Comments: 
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
## 2017-01-06 NOTE — ED Triage Notes (Signed)
Pt complaint of SOB, fever, and sore throat worsening since Tuesday.

## 2017-01-13 ENCOUNTER — Encounter (HOSPITAL_COMMUNITY): Payer: Self-pay | Admitting: Student

## 2017-01-13 ENCOUNTER — Inpatient Hospital Stay (HOSPITAL_COMMUNITY)
Admission: AD | Admit: 2017-01-13 | Discharge: 2017-01-13 | Disposition: A | Discharge status: Home / Self Care | Payer: Medicaid Other | Source: Ambulatory Visit | Attending: Obstetrics and Gynecology | Admitting: Obstetrics and Gynecology

## 2017-01-13 DIAGNOSIS — Z800 Family history of malignant neoplasm of digestive organs: Secondary | ICD-10-CM | POA: Diagnosis not present

## 2017-01-13 DIAGNOSIS — Z888 Allergy status to other drugs, medicaments and biological substances status: Secondary | ICD-10-CM | POA: Insufficient documentation

## 2017-01-13 DIAGNOSIS — N920 Excessive and frequent menstruation with regular cycle: Secondary | ICD-10-CM

## 2017-01-13 DIAGNOSIS — Z833 Family history of diabetes mellitus: Secondary | ICD-10-CM | POA: Diagnosis not present

## 2017-01-13 DIAGNOSIS — F17210 Nicotine dependence, cigarettes, uncomplicated: Secondary | ICD-10-CM | POA: Diagnosis not present

## 2017-01-13 DIAGNOSIS — K219 Gastro-esophageal reflux disease without esophagitis: Secondary | ICD-10-CM | POA: Insufficient documentation

## 2017-01-13 DIAGNOSIS — Z9889 Other specified postprocedural states: Secondary | ICD-10-CM | POA: Diagnosis not present

## 2017-01-13 DIAGNOSIS — Z8249 Family history of ischemic heart disease and other diseases of the circulatory system: Secondary | ICD-10-CM | POA: Insufficient documentation

## 2017-01-13 DIAGNOSIS — Z823 Family history of stroke: Secondary | ICD-10-CM | POA: Diagnosis not present

## 2017-01-13 DIAGNOSIS — Z8541 Personal history of malignant neoplasm of cervix uteri: Secondary | ICD-10-CM | POA: Diagnosis not present

## 2017-01-13 DIAGNOSIS — Z86010 Personal history of colonic polyps: Secondary | ICD-10-CM | POA: Insufficient documentation

## 2017-01-13 DIAGNOSIS — F419 Anxiety disorder, unspecified: Secondary | ICD-10-CM | POA: Diagnosis not present

## 2017-01-13 DIAGNOSIS — Z79899 Other long term (current) drug therapy: Secondary | ICD-10-CM | POA: Insufficient documentation

## 2017-01-13 DIAGNOSIS — K2270 Barrett's esophagus without dysplasia: Secondary | ICD-10-CM | POA: Insufficient documentation

## 2017-01-13 DIAGNOSIS — R51 Headache: Secondary | ICD-10-CM | POA: Insufficient documentation

## 2017-01-13 DIAGNOSIS — Z825 Family history of asthma and other chronic lower respiratory diseases: Secondary | ICD-10-CM | POA: Diagnosis not present

## 2017-01-13 LAB — CBC
HEMATOCRIT: 39.6 % (ref 36.0–46.0)
Hemoglobin: 13.7 g/dL (ref 12.0–15.0)
MCH: 31.2 pg (ref 26.0–34.0)
MCHC: 34.6 g/dL (ref 30.0–36.0)
MCV: 90.2 fL (ref 78.0–100.0)
PLATELETS: 204 10*3/uL (ref 150–400)
RBC: 4.39 MIL/uL (ref 3.87–5.11)
RDW: 12.8 % (ref 11.5–15.5)
WBC: 7.2 10*3/uL (ref 4.0–10.5)

## 2017-01-13 LAB — URINALYSIS, ROUTINE W REFLEX MICROSCOPIC
Bacteria, UA: NONE SEEN
Bilirubin Urine: NEGATIVE
Glucose, UA: NEGATIVE mg/dL
KETONES UR: NEGATIVE mg/dL
LEUKOCYTES UA: NEGATIVE
Nitrite: NEGATIVE
PROTEIN: NEGATIVE mg/dL
Specific Gravity, Urine: 1.002 — ABNORMAL LOW (ref 1.005–1.030)
Squamous Epithelial / LPF: NONE SEEN
pH: 8 (ref 5.0–8.0)

## 2017-01-13 LAB — POCT PREGNANCY, URINE: PREG TEST UR: NEGATIVE

## 2017-01-13 LAB — WET PREP, GENITAL
Clue Cells Wet Prep HPF POC: NONE SEEN
SPERM: NONE SEEN
TRICH WET PREP: NONE SEEN
YEAST WET PREP: NONE SEEN
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
## 2017-01-13 NOTE — MAU Note (Signed)
LMP 01/09/17. Bleeding a lot today to be 5th day of cycles. Used 10 tampons since 1000. Passing some small clots

## 2017-01-13 NOTE — Discharge Instructions (Signed)
Abnormal Uterine Bleeding Abnormal uterine bleeding can affect women at various stages in life, including teenagers, women in their reproductive years, pregnant women, and women who have reached menopause. Several kinds of uterine bleeding are considered abnormal, including:  Bleeding or spotting between periods.  Bleeding after sexual intercourse.  Bleeding that is heavier or more than normal.  Periods that last longer than usual.  Bleeding after menopause. Many cases of abnormal uterine bleeding are minor and simple to treat, while others are more serious. Any type of abnormal bleeding should be evaluated by your health care provider. Treatment will depend on the cause of the bleeding. Follow these instructions at home: Monitor your condition for any changes. The following actions may help to alleviate any discomfort you are experiencing:  Avoid the use of tampons and douches as directed by your health care provider.  Change your pads frequently. You should get regular pelvic exams and Pap tests. Keep all follow-up appointments for diagnostic tests as directed by your health care provider. Contact a health care provider if:  Your bleeding lasts more than 1 week.  You feel dizzy at times. Get help right away if:  You pass out.  You are changing pads every 15 to 30 minutes.  You have abdominal pain.  You have a fever.  You become sweaty or weak.  You are passing large blood clots from the vagina.  You start to feel nauseous and vomit. This information is not intended to replace advice given to you by your health care provider. Make sure you discuss any questions you have with your health care provider. Document Released: 11/20/2005 Document Revised: 05/03/2016 Document Reviewed: 06/19/2013 Elsevier Interactive Patient Education  2017 Elsevier Inc.  

## 2017-01-15 ENCOUNTER — Encounter: Payer: Self-pay | Admitting: Nurse Practitioner

## 2017-01-15 ENCOUNTER — Ambulatory Visit (INDEPENDENT_AMBULATORY_CARE_PROVIDER_SITE_OTHER): Payer: Medicaid Other | Admitting: Nurse Practitioner

## 2017-01-15 VITALS — BP 126/72 | HR 80 | Ht 64.5 in | Wt 215.0 lb

## 2017-01-15 DIAGNOSIS — R1084 Generalized abdominal pain: Secondary | ICD-10-CM
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
## 2017-01-15 NOTE — Patient Instructions (Signed)
Follow up as needed

## 2017-07-17 ENCOUNTER — Emergency Department (HOSPITAL_COMMUNITY): Payer: Medicaid Other

## 2017-07-17 ENCOUNTER — Encounter (HOSPITAL_COMMUNITY): Payer: Self-pay | Admitting: Emergency Medicine

## 2017-07-17 ENCOUNTER — Emergency Department (HOSPITAL_COMMUNITY)
Admission: EM | Admit: 2017-07-17 | Discharge: 2017-07-17 | Disposition: A | Discharge status: Home / Self Care | Payer: Medicaid Other | Attending: Emergency Medicine | Admitting: Emergency Medicine

## 2017-07-17 DIAGNOSIS — Y9367 Activity, basketball: Secondary | ICD-10-CM | POA: Insufficient documentation

## 2017-07-17 DIAGNOSIS — S299XXA Unspecified injury of thorax, initial encounter: Secondary | ICD-10-CM | POA: Diagnosis present

## 2017-07-17 DIAGNOSIS — Y998 Other external cause status: Secondary | ICD-10-CM | POA: Diagnosis not present

## 2017-07-17 DIAGNOSIS — S301XXA Contusion of abdominal wall, initial encounter: Secondary | ICD-10-CM

## 2017-07-17 DIAGNOSIS — S20219A Contusion of unspecified front wall of thorax, initial encounter: Secondary | ICD-10-CM | POA: Insufficient documentation

## 2017-07-17 DIAGNOSIS — W208XXA Other cause of strike by thrown, projected or falling object, initial encounter: Secondary | ICD-10-CM | POA: Diagnosis not present

## 2017-07-17 DIAGNOSIS — Z79899 Other long term (current) drug therapy: Secondary | ICD-10-CM | POA: Diagnosis not present

## 2017-07-17 DIAGNOSIS — Y92310 Basketball court as the place of occurrence of the external cause: Secondary | ICD-10-CM | POA: Diagnosis not present

## 2017-07-17 DIAGNOSIS — F17210 Nicotine dependence, cigarettes, uncomplicated: Secondary | ICD-10-CM | POA: Diagnosis not present

## 2017-07-17 LAB — BASIC METABOLIC PANEL
ANION GAP: 9 (ref 5–15)
BUN: 8 mg/dL (ref 6–20)
CALCIUM: 9 mg/dL (ref 8.9–10.3)
CO2: 24 mmol/L (ref 22–32)
Chloride: 107 mmol/L (ref 101–111)
Creatinine, Ser: 0.74 mg/dL (ref 0.44–1.00)
Glucose, Bld: 111 mg/dL — ABNORMAL HIGH (ref 65–99)
POTASSIUM: 3.5 mmol/L (ref 3.5–5.1)
Sodium: 140 mmol/L (ref 135–145)

## 2017-07-17 LAB — CBC
HEMATOCRIT: 42.1 % (ref 36.0–46.0)
HEMOGLOBIN: 14.8 g/dL (ref 12.0–15.0)
MCH: 31.5 pg (ref 26.0–34.0)
MCHC: 35.2 g/dL (ref 30.0–36.0)
MCV: 89.6 fL (ref 78.0–100.0)
Platelets: 222 10*3/uL (ref 150–400)
RBC: 4.7 MIL/uL (ref 3.87–5.11)
RDW: 12.9 % (ref 11.5–15.5)
WBC: 8.8 10*3/uL (ref 4.0–10.5)

## 2017-07-17 LAB — POCT I-STAT TROPONIN I: TROPONIN I, POC: 0 ng/mL (ref 0.00–0.08)

## 2017-07-17 MED ORDER — KETOROLAC TROMETHAMINE 60 MG/2ML IM SOLN
60.0000 mg | Freq: Once | INTRAMUSCULAR | Status: AC
  Administered 2017-07-17: 60 mg via INTRAMUSCULAR
  Filled 2017-07-17: qty 2

## 2017-07-17 MED ORDER — LIDOCAINE 5 % EX PTCH
1.0000 | MEDICATED_PATCH | CUTANEOUS | Status: DC
  Filled 2017-07-17: qty 1

## 2017-07-17 MED ORDER — OXYCODONE-ACETAMINOPHEN 5-325 MG PO TABS: 2.0000 | ORAL_TABLET | Freq: Three times a day (TID) | ORAL | 0 refills | Status: DC | PRN

## 2017-07-17 MED ORDER — ONDANSETRON 8 MG PO TBDP
8.0000 mg | ORAL_TABLET | Freq: Once | ORAL | Status: AC
  Administered 2017-07-17: 8 mg via ORAL
  Filled 2017-07-17: qty 1

## 2017-07-17 NOTE — Discharge Instructions (Signed)
We advise continued use of 600 mg ibuprofen every 6 hours for pain control. You may take Percocet as needed for severe pain. We advise use of an incentive spirometer at least once per hour while awake to ensure that you are taking deep breaths. You may also purchase Salonpas over-the-counter and massage it to areas of pain as it has a numbing agent which may help with pain control. Follow-up with your primary care doctor to ensure resolution of symptoms.
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
## 2017-07-17 NOTE — ED Triage Notes (Signed)
Pt reports that on 07/14/17 she was putting up a basketball goal and it fell onto her chest. Pt states it feels sore across chest and also reporting nausea.

## 2017-10-07 ENCOUNTER — Encounter (HOSPITAL_COMMUNITY): Payer: Self-pay

## 2017-10-07 DIAGNOSIS — Z5321 Procedure and treatment not carried out due to patient leaving prior to being seen by health care provider: Secondary | ICD-10-CM | POA: Insufficient documentation

## 2017-10-07 DIAGNOSIS — R109 Unspecified abdominal pain: Secondary | ICD-10-CM | POA: Insufficient documentation

## 2017-10-07 NOTE — ED Triage Notes (Signed)
Abdominal pain for a couple of days and getting worse hx of diverticulitis no fever with nausea and diarrhea.

## 2017-10-08 ENCOUNTER — Emergency Department (HOSPITAL_COMMUNITY)
Admission: EM | Admit: 2017-10-08 | Discharge: 2017-10-08 | Disposition: A | Discharge status: Home / Self Care | Payer: Medicaid Other | Attending: Emergency Medicine | Admitting: Emergency Medicine

## 2017-10-08 ENCOUNTER — Encounter (HOSPITAL_COMMUNITY): Payer: Self-pay

## 2017-10-08 DIAGNOSIS — R197 Diarrhea, unspecified: Secondary | ICD-10-CM | POA: Diagnosis not present

## 2017-10-08 DIAGNOSIS — R110 Nausea: Secondary | ICD-10-CM | POA: Insufficient documentation

## 2017-10-08 DIAGNOSIS — Z8541 Personal history of malignant neoplasm of cervix uteri: Secondary | ICD-10-CM | POA: Insufficient documentation

## 2017-10-08 DIAGNOSIS — F17210 Nicotine dependence, cigarettes, uncomplicated: Secondary | ICD-10-CM | POA: Insufficient documentation

## 2017-10-08 DIAGNOSIS — R1030 Lower abdominal pain, unspecified: Secondary | ICD-10-CM | POA: Insufficient documentation

## 2017-10-08 DIAGNOSIS — K5792 Diverticulitis of intestine, part unspecified, without perforation or abscess without bleeding: Secondary | ICD-10-CM | POA: Insufficient documentation

## 2017-10-08 LAB — CBC
HCT: 42.6 % (ref 36.0–46.0)
HEMATOCRIT: 43 % (ref 36.0–46.0)
HEMOGLOBIN: 15 g/dL (ref 12.0–15.0)
Hemoglobin: 14.7 g/dL (ref 12.0–15.0)
MCH: 31.7 pg (ref 26.0–34.0)
MCH: 31.5 pg (ref 26.0–34.0)
MCHC: 34.9 g/dL (ref 30.0–36.0)
MCHC: 34.5 g/dL (ref 30.0–36.0)
MCV: 90.9 fL (ref 78.0–100.0)
MCV: 91.4 fL (ref 78.0–100.0)
PLATELETS: 188 10*3/uL (ref 150–400)
Platelets: 199 10*3/uL (ref 150–400)
RBC: 4.66 MIL/uL (ref 3.87–5.11)
RBC: 4.73 MIL/uL (ref 3.87–5.11)
RDW: 13.1 % (ref 11.5–15.5)
RDW: 13.2 % (ref 11.5–15.5)
WBC: 12.2 10*3/uL — ABNORMAL HIGH (ref 4.0–10.5)
WBC: 10.6 10*3/uL — ABNORMAL HIGH (ref 4.0–10.5)

## 2017-10-08 LAB — COMPREHENSIVE METABOLIC PANEL
ALK PHOS: 62 U/L (ref 38–126)
ALT: 16 U/L (ref 14–54)
ALT: 13 U/L — AB (ref 14–54)
ANION GAP: 8 (ref 5–15)
AST: 16 U/L (ref 15–41)
AST: 15 U/L (ref 15–41)
Albumin: 3.9 g/dL (ref 3.5–5.0)
Albumin: 3.7 g/dL (ref 3.5–5.0)
Alkaline Phosphatase: 62 U/L (ref 38–126)
Anion gap: 7 (ref 5–15)
BUN: 8 mg/dL (ref 6–20)
BUN: 6 mg/dL (ref 6–20)
CALCIUM: 8.5 mg/dL — AB (ref 8.9–10.3)
CHLORIDE: 106 mmol/L (ref 101–111)
CO2: 24 mmol/L (ref 22–32)
CO2: 21 mmol/L — AB (ref 22–32)
CREATININE: 0.7 mg/dL (ref 0.44–1.00)
Calcium: 8.5 mg/dL — ABNORMAL LOW (ref 8.9–10.3)
Chloride: 108 mmol/L (ref 101–111)
Creatinine, Ser: 0.66 mg/dL (ref 0.44–1.00)
GFR calc Af Amer: >60 mL/min (ref >60)
GFR calc non Af Amer: >60 mL/min (ref >60)
GFR calc non Af Amer: >60 mL/min (ref >60)
GLUCOSE: 103 mg/dL — AB (ref 65–99)
Glucose, Bld: 114 mg/dL — ABNORMAL HIGH (ref 65–99)
POTASSIUM: 3.9 mmol/L (ref 3.5–5.1)
Potassium: 4.1 mmol/L (ref 3.5–5.1)
SODIUM: 137 mmol/L (ref 135–145)
SODIUM: 137 mmol/L (ref 135–145)
Total Bilirubin: 0.7 mg/dL (ref 0.3–1.2)
Total Bilirubin: 0.7 mg/dL (ref 0.3–1.2)
Total Protein: 6.6 g/dL (ref 6.5–8.1)
Total Protein: 6.5 g/dL (ref 6.5–8.1)

## 2017-10-08 LAB — URINALYSIS, ROUTINE W REFLEX MICROSCOPIC
Bilirubin Urine: NEGATIVE
GLUCOSE, UA: NEGATIVE mg/dL
HGB URINE DIPSTICK: NEGATIVE
KETONES UR: NEGATIVE mg/dL
Leukocytes, UA: NEGATIVE
Nitrite: NEGATIVE
PROTEIN: NEGATIVE mg/dL
Specific Gravity, Urine: 1.019 (ref 1.005–1.030)
pH: 5 (ref 5.0–8.0)

## 2017-10-08 LAB — LIPASE, BLOOD
LIPASE: 23 U/L (ref 11–51)
Lipase: 29 U/L (ref 11–51)

## 2017-10-08 NOTE — ED Notes (Signed)
Patient has been called 3 times and no answer.

## 2017-10-08 NOTE — ED Triage Notes (Signed)
Pt states that for the past three days she has been having lower abd pain, worse on the lower R, hx of diverticulitis, nausea, no vomiting some diarrhea.

## 2017-10-08 NOTE — ED Notes (Signed)
No answer when called 

## 2017-10-09 ENCOUNTER — Emergency Department (HOSPITAL_COMMUNITY)
Admission: EM | Admit: 2017-10-09 | Discharge: 2017-10-09 | Disposition: A | Discharge status: Home / Self Care | Payer: Medicaid Other | Attending: Emergency Medicine | Admitting: Emergency Medicine

## 2017-10-09 DIAGNOSIS — R1030 Lower abdominal pain, unspecified: Secondary | ICD-10-CM

## 2017-10-09 DIAGNOSIS — K5792 Diverticulitis of intestine, part unspecified, without perforation or abscess without bleeding: Secondary | ICD-10-CM

## 2017-10-09 MED ORDER — ONDANSETRON 4 MG PO TBDP: 4.0000 mg | ORAL_TABLET | Freq: Three times a day (TID) | ORAL | 0 refills | Status: DC | PRN

## 2017-10-09 MED ORDER — OXYCODONE-ACETAMINOPHEN 5-325 MG PO TABS
1.0000 | ORAL_TABLET | Freq: Once | ORAL | Status: DC
  Filled 2017-10-09 (×2): qty 1

## 2017-10-09 MED ORDER — OXYCODONE-ACETAMINOPHEN 5-325 MG PO TABS: 1.0000 | ORAL_TABLET | Freq: Four times a day (QID) | ORAL | 0 refills | Status: DC | PRN

## 2017-10-09 MED ORDER — ACETAMINOPHEN 325 MG PO TABS
650.0000 mg | ORAL_TABLET | Freq: Once | ORAL | Status: AC
  Administered 2017-10-09: 650 mg via ORAL
  Filled 2017-10-09: qty 2

## 2017-10-09 MED ORDER — AMOXICILLIN-POT CLAVULANATE 875-125 MG PO TABS
1.0000 | ORAL_TABLET | Freq: Once | ORAL | Status: AC
  Administered 2017-10-09: 1 via ORAL
  Filled 2017-10-09: qty 1

## 2017-10-09 MED ORDER — AMOXICILLIN-POT CLAVULANATE 875-125 MG PO TABS: 1.0000 | ORAL_TABLET | Freq: Two times a day (BID) | ORAL | 0 refills | Status: DC

## 2017-10-09 NOTE — Discharge Instructions (Signed)
If you develop fever, worsening pain, any new or worsening symptoms you should be reevaluated.
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
## 2017-10-11 ENCOUNTER — Other Ambulatory Visit: Payer: Self-pay | Admitting: Surgery

## 2017-10-11 DIAGNOSIS — E042 Nontoxic multinodular goiter: Secondary | ICD-10-CM

## 2017-11-06 ENCOUNTER — Ambulatory Visit
Admission: RE | Admit: 2017-11-06 | Discharge: 2017-11-06 | Disposition: A | Discharge status: Home / Self Care | Payer: Medicaid Other | Source: Ambulatory Visit | Attending: Surgery | Admitting: Surgery

## 2017-11-06 DIAGNOSIS — E042 Nontoxic multinodular goiter: Secondary | ICD-10-CM

## 2017-11-07 NOTE — Progress Notes (Signed)
Christine Shah, Please notify patient that two nodules will require FNA biopsy.  Please arrange.  [place] Office: [phone]

## 2017-11-08 ENCOUNTER — Other Ambulatory Visit: Payer: Self-pay | Admitting: Surgery

## 2017-11-08 DIAGNOSIS — E042 Nontoxic multinodular goiter: Secondary | ICD-10-CM

## 2017-11-14 ENCOUNTER — Ambulatory Visit
Admission: RE | Admit: 2017-11-14 | Discharge: 2017-11-14 | Disposition: A | Discharge status: Home / Self Care | Payer: Medicaid Other | Source: Ambulatory Visit | Attending: Surgery | Admitting: Surgery

## 2017-11-14 ENCOUNTER — Other Ambulatory Visit (HOSPITAL_COMMUNITY)
Admission: RE | Admit: 2017-11-14 | Discharge: 2017-11-14 | Disposition: A | Discharge status: Home / Self Care | Payer: Medicaid Other | Source: Ambulatory Visit | Attending: Radiology | Admitting: Radiology

## 2017-11-14 DIAGNOSIS — E042 Nontoxic multinodular goiter: Secondary | ICD-10-CM | POA: Insufficient documentation

## 2017-11-14 HISTORY — PX: BIOPSY THYROID: PRO38

## 2017-12-30 ENCOUNTER — Encounter (HOSPITAL_COMMUNITY): Payer: Self-pay

## 2017-12-30 ENCOUNTER — Emergency Department (HOSPITAL_COMMUNITY): Payer: Medicaid Other

## 2017-12-30 ENCOUNTER — Emergency Department (HOSPITAL_COMMUNITY)
Admission: EM | Admit: 2017-12-30 | Discharge: 2017-12-30 | Disposition: A | Discharge status: Home / Self Care | Payer: Medicaid Other | Attending: Physician Assistant | Admitting: Physician Assistant

## 2017-12-30 DIAGNOSIS — R072 Precordial pain: Secondary | ICD-10-CM | POA: Insufficient documentation

## 2017-12-30 DIAGNOSIS — R5383 Other fatigue: Secondary | ICD-10-CM | POA: Diagnosis not present

## 2017-12-30 DIAGNOSIS — R42 Dizziness and giddiness: Secondary | ICD-10-CM | POA: Insufficient documentation

## 2017-12-30 DIAGNOSIS — R079 Chest pain, unspecified: Secondary | ICD-10-CM | POA: Diagnosis present

## 2017-12-30 DIAGNOSIS — Z8541 Personal history of malignant neoplasm of cervix uteri: Secondary | ICD-10-CM | POA: Diagnosis not present

## 2017-12-30 DIAGNOSIS — F17210 Nicotine dependence, cigarettes, uncomplicated: Secondary | ICD-10-CM | POA: Insufficient documentation

## 2017-12-30 LAB — BASIC METABOLIC PANEL
ANION GAP: 11 (ref 5–15)
BUN: 7 mg/dL (ref 6–20)
CALCIUM: 9.2 mg/dL (ref 8.9–10.3)
CO2: 25 mmol/L (ref 22–32)
CREATININE: 0.72 mg/dL (ref 0.44–1.00)
Chloride: 107 mmol/L (ref 101–111)
GFR calc non Af Amer: >60 mL/min (ref >60)
Glucose, Bld: 104 mg/dL — ABNORMAL HIGH (ref 65–99)
Potassium: 3.9 mmol/L (ref 3.5–5.1)
SODIUM: 143 mmol/L (ref 135–145)

## 2017-12-30 LAB — CBC
HCT: 45.2 % (ref 36.0–46.0)
HEMOGLOBIN: 15.9 g/dL — AB (ref 12.0–15.0)
MCH: 32.4 pg (ref 26.0–34.0)
MCHC: 35.2 g/dL (ref 30.0–36.0)
MCV: 92.1 fL (ref 78.0–100.0)
PLATELETS: 192 10*3/uL (ref 150–400)
RBC: 4.91 MIL/uL (ref 3.87–5.11)
RDW: 12.6 % (ref 11.5–15.5)
WBC: 7.3 10*3/uL (ref 4.0–10.5)

## 2017-12-30 LAB — I-STAT TROPONIN, ED: TROPONIN I, POC: 0 ng/mL (ref 0.00–0.08)

## 2017-12-30 LAB — I-STAT BETA HCG BLOOD, ED (MC, WL, AP ONLY)

## 2017-12-30 NOTE — ED Triage Notes (Signed)
Patient complains of 3 weeks of intermittent chest pain and palpitations with weakness x 2 weeks. Patient reports that she wasn't sure if anxiety or her heart. Alert and oriented, NAD
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
## 2017-12-30 NOTE — ED Notes (Signed)
Patient able to ambulate independently  

## 2018-03-14 ENCOUNTER — Emergency Department (HOSPITAL_COMMUNITY): Payer: Medicaid Other

## 2018-03-14 ENCOUNTER — Encounter (HOSPITAL_COMMUNITY): Payer: Self-pay | Admitting: *Deleted

## 2018-03-14 ENCOUNTER — Other Ambulatory Visit: Payer: Self-pay

## 2018-03-14 ENCOUNTER — Emergency Department (HOSPITAL_COMMUNITY)
Admission: EM | Admit: 2018-03-14 | Discharge: 2018-03-14 | Disposition: A | Discharge status: Home / Self Care | Payer: Medicaid Other | Attending: Emergency Medicine | Admitting: Emergency Medicine

## 2018-03-14 DIAGNOSIS — Z79899 Other long term (current) drug therapy: Secondary | ICD-10-CM | POA: Diagnosis not present

## 2018-03-14 DIAGNOSIS — J441 Chronic obstructive pulmonary disease with (acute) exacerbation: Secondary | ICD-10-CM

## 2018-03-14 DIAGNOSIS — F17210 Nicotine dependence, cigarettes, uncomplicated: Secondary | ICD-10-CM | POA: Diagnosis not present

## 2018-03-14 DIAGNOSIS — Z8541 Personal history of malignant neoplasm of cervix uteri: Secondary | ICD-10-CM | POA: Insufficient documentation

## 2018-03-14 DIAGNOSIS — R0602 Shortness of breath: Secondary | ICD-10-CM | POA: Diagnosis present

## 2018-03-14 MED ORDER — PREDNISONE 20 MG PO TABS
60.0000 mg | ORAL_TABLET | Freq: Once | ORAL | Status: AC
  Administered 2018-03-14: 60 mg via ORAL
  Filled 2018-03-14: qty 3

## 2018-03-14 MED ORDER — IPRATROPIUM-ALBUTEROL 0.5-2.5 (3) MG/3ML IN SOLN
3.0000 mL | Freq: Once | RESPIRATORY_TRACT | Status: AC
  Administered 2018-03-14: 3 mL via RESPIRATORY_TRACT
  Filled 2018-03-14: qty 3

## 2018-03-14 MED ORDER — ALBUTEROL SULFATE HFA 108 (90 BASE) MCG/ACT IN AERS
1.0000 | INHALATION_SPRAY | Freq: Once | RESPIRATORY_TRACT | Status: AC
  Administered 2018-03-14: 2 via RESPIRATORY_TRACT
  Filled 2018-03-14: qty 6.7

## 2018-03-14 MED ORDER — VANCOMYCIN HCL IN DEXTROSE 1-5 GM/200ML-% IV SOLN: 1000.0000 mg | Freq: Once | INTRAVENOUS | Status: DC

## 2018-03-14 MED ORDER — PREDNISONE 20 MG PO TABS: 60.0000 mg | ORAL_TABLET | Freq: Every day | ORAL | 0 refills | Status: AC

## 2018-03-14 MED ORDER — SODIUM CHLORIDE 0.9 % IV SOLN: 1.0000 g | Freq: Three times a day (TID) | INTRAVENOUS | Status: DC

## 2018-03-14 NOTE — ED Notes (Signed)
ED Provider at bedside. 
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
## 2018-03-14 NOTE — ED Triage Notes (Signed)
Pt in c/o shortness of breath for the last few days, increased cough and congestion, history of using inhalers and breathing tx, pt speaking in full sentences, no distress noted

## 2018-03-16 ENCOUNTER — Encounter (HOSPITAL_COMMUNITY): Payer: Self-pay | Admitting: Emergency Medicine

## 2018-03-16 ENCOUNTER — Emergency Department (HOSPITAL_COMMUNITY): Payer: Medicaid Other

## 2018-03-16 ENCOUNTER — Emergency Department (HOSPITAL_COMMUNITY)
Admission: EM | Admit: 2018-03-16 | Discharge: 2018-03-16 | Disposition: A | Discharge status: Home / Self Care | Payer: Medicaid Other | Attending: Emergency Medicine | Admitting: Emergency Medicine

## 2018-03-16 DIAGNOSIS — Z8541 Personal history of malignant neoplasm of cervix uteri: Secondary | ICD-10-CM | POA: Diagnosis not present

## 2018-03-16 DIAGNOSIS — F17210 Nicotine dependence, cigarettes, uncomplicated: Secondary | ICD-10-CM | POA: Diagnosis not present

## 2018-03-16 DIAGNOSIS — F419 Anxiety disorder, unspecified: Secondary | ICD-10-CM | POA: Insufficient documentation

## 2018-03-16 DIAGNOSIS — J40 Bronchitis, not specified as acute or chronic: Secondary | ICD-10-CM | POA: Diagnosis not present

## 2018-03-16 DIAGNOSIS — J449 Chronic obstructive pulmonary disease, unspecified: Secondary | ICD-10-CM | POA: Insufficient documentation

## 2018-03-16 DIAGNOSIS — R0602 Shortness of breath: Secondary | ICD-10-CM | POA: Diagnosis present

## 2018-03-16 LAB — CBC WITH DIFFERENTIAL/PLATELET
Basophils Absolute: 0 10*3/uL (ref 0.0–0.1)
Basophils Relative: 0 %
EOS PCT: 0 %
Eosinophils Absolute: 0 10*3/uL (ref 0.0–0.7)
HEMATOCRIT: 43.4 % (ref 36.0–46.0)
Hemoglobin: 14.3 g/dL (ref 12.0–15.0)
LYMPHS ABS: 1.7 10*3/uL (ref 0.7–4.0)
LYMPHS PCT: 22 %
MCH: 30.6 pg (ref 26.0–34.0)
MCHC: 32.9 g/dL (ref 30.0–36.0)
MCV: 92.9 fL (ref 78.0–100.0)
MONO ABS: 0.9 10*3/uL (ref 0.1–1.0)
Monocytes Relative: 12 %
NEUTROS ABS: 4.9 10*3/uL (ref 1.7–7.7)
Neutrophils Relative %: 66 %
PLATELETS: 214 10*3/uL (ref 150–400)
RBC: 4.67 MIL/uL (ref 3.87–5.11)
RDW: 13.6 % (ref 11.5–15.5)
WBC: 7.5 10*3/uL (ref 4.0–10.5)

## 2018-03-16 LAB — BASIC METABOLIC PANEL
ANION GAP: 11 (ref 5–15)
BUN: 10 mg/dL (ref 6–20)
CALCIUM: 9.2 mg/dL (ref 8.9–10.3)
CO2: 23 mmol/L (ref 22–32)
Chloride: 108 mmol/L (ref 101–111)
Creatinine, Ser: 0.75 mg/dL (ref 0.44–1.00)
GFR calc Af Amer: >60 mL/min (ref >60)
GLUCOSE: 113 mg/dL — AB (ref 65–99)
POTASSIUM: 4.2 mmol/L (ref 3.5–5.1)
Sodium: 142 mmol/L (ref 135–145)

## 2018-03-16 LAB — I-STAT TROPONIN, ED: Troponin i, poc: 0 ng/mL (ref 0.00–0.08)

## 2018-03-16 LAB — D-DIMER, QUANTITATIVE (NOT AT ARMC): D DIMER QUANT: 0.53 ug{FEU}/mL — AB (ref 0.00–0.50)

## 2018-03-16 MED ORDER — IPRATROPIUM-ALBUTEROL 0.5-2.5 (3) MG/3ML IN SOLN
3.0000 mL | Freq: Once | RESPIRATORY_TRACT | Status: AC
  Administered 2018-03-16: 3 mL via RESPIRATORY_TRACT
  Filled 2018-03-16: qty 3

## 2018-03-16 MED ORDER — AZITHROMYCIN 250 MG PO TABS: 250.0000 mg | ORAL_TABLET | Freq: Every day | ORAL | 0 refills | Status: DC

## 2018-03-16 MED ORDER — SODIUM CHLORIDE 0.9 % IJ SOLN
INTRAMUSCULAR | Status: AC
  Filled 2018-03-16: qty 50

## 2018-03-16 MED ORDER — FLUCONAZOLE 150 MG PO TABS: 150.0000 mg | ORAL_TABLET | Freq: Once | ORAL | 0 refills | Status: AC

## 2018-03-16 MED ORDER — IOPAMIDOL (ISOVUE-370) INJECTION 76%
100.0000 mL | Freq: Once | INTRAVENOUS | Status: AC | PRN
  Administered 2018-03-16: 100 mL via INTRAVENOUS

## 2018-03-16 MED ORDER — IOPAMIDOL (ISOVUE-370) INJECTION 76%
INTRAVENOUS | Status: AC
  Filled 2018-03-16: qty 100

## 2018-03-16 NOTE — ED Notes (Signed)
Patient transported to CT 
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
## 2018-03-16 NOTE — ED Triage Notes (Signed)
Pt is requesting to be re-evaluated for shortness of breath, states she was seen last night at [facility] diagnosed with COPD excerebration.

## 2018-03-16 NOTE — ED Notes (Signed)
Bed: LM78 Expected date:  Expected time:  Means of arrival:  Comments: EMS 52 yo female SOB x 4 days-seen at [facility] last night and diagnosed with COPD exacerbation

## 2018-04-26 ENCOUNTER — Encounter: Payer: Self-pay | Admitting: Internal Medicine

## 2018-04-26 ENCOUNTER — Ambulatory Visit: Payer: Medicaid Other | Admitting: Internal Medicine

## 2018-04-26 VITALS — BP 114/64 | HR 80 | Ht 66.0 in | Wt 218.6 lb

## 2018-04-26 DIAGNOSIS — R0609 Other forms of dyspnea: Secondary | ICD-10-CM

## 2018-04-26 DIAGNOSIS — R062 Wheezing: Secondary | ICD-10-CM

## 2018-04-26 DIAGNOSIS — R002 Palpitations: Secondary | ICD-10-CM

## 2018-04-26 DIAGNOSIS — Z87891 Personal history of nicotine dependence: Secondary | ICD-10-CM | POA: Diagnosis not present

## 2018-04-26 LAB — NITRIC OXIDE: NITRIC OXIDE: 5

## 2018-04-26 MED ORDER — TIOTROPIUM BROMIDE MONOHYDRATE 2.5 MCG/ACT IN AERS: 2.0000 | INHALATION_SPRAY | Freq: Every day | RESPIRATORY_TRACT | 0 refills | Status: DC

## 2018-04-26 NOTE — Patient Instructions (Addendum)
Other form of dyspnea History of smoking 30 or more pack years Wheezing  - symptoms despite symbicort present - unclear cause  - add empiric inhaled anticholinergic - spiriva for 1 month - do full PFT in 1 month - let us regroup after this  Palpitations - refer cardiology   Snoring - assess ESS next visit  Followup 1 month full PFT Visit Dr Chase Caller or APP in 1 month; do CAT scoer and ESS score at followup If unimprioved then CPST bike test and sleep doc referral

## 2018-04-26 NOTE — Progress Notes (Signed)
Subjective:    Patient ID: Christine Shah, female    DOB: Oct 02, 1966, 52 y.o.   MRN: [id number]  PCP Maryella Shivers, MD   HPI  IOV 04/26/2018  Chief Complaint  Patient presents with  . Consult    Referred by [facility] due to SOB.  Pt has been to ED twice due to trouble breathing. Pt states she has had c/o CP, SOB that can happen at any time, and occ cough with clear mucus.   Christine Shah presents for a new evaluation.  History is gained from her and review of the chart.  As best as I can gather she tells me that she is an ongoing smoker with a greater than 40 pack smoking history.  Chart review shows she had a normal cardiac stress test in 2012.  She does not recollect being seen by cardiologist.  For the last few years she has had intermittent palpitations that last a few seconds.  Sometimes these happen every day but on an average it happens every few days.  Definitely episodic.  Last few seconds.  No clear-cut relieving or aggravating factors.  No associated symptoms up until April of this year.  Does not wake her up at night.  Then in April 2019 she started having sudden onset of shortness of breath.  She says it felt like the abdomen was pushing into her chest as if she was 9 months pregnant and having difficulty breathing.  Ended up in the ER pulmonary embolism was ruled out [I personally visualized that CT scan and agree].  Since then she has had episodic dyspnea in the same pattern although not that severe.  Dyspnea is definitely new since April 2019.  She is also had a cough.  She is on Symbicort since April 2019 without any relief.  The dyspnea and the palpitation episodes do not correlate other than the fact both the episodic and happen on an average every few days.  The dyspnea episodes last few minutes.  There is no associated syncope orthopnea proximal nocturnal dyspnea although when she goes to bed she does have some wheezing.  Walking desaturation test on 85 feet x 3  laps in the office: Is normal but she got tachycardic.  Exam nitric oxide today is normal at 5 ppb but this is on Symbicort  Of note , son says she snores modestly and she admits to moderate degree to mild degree of day time sleepiness  CAT COPD Symptom & Quality of Life Score ([place]) 0 is no burden. 5 is highest burden 04/26/2018   Never Cough -> Cough all the time 3  No phlegm in chest -> Chest is full of phlegm 3  No chest tightness -> Chest feels very tight 4  No dyspnea for 1 flight stairs/hill -> Very dyspneic for 1 flight of stairs 4  No limitations for ADL at home -> Very limited with ADL at home 0  Confident leaving home -> Not at all confident leaving home 0  Sleep soundly -> Do not sleep soundly because of lung condition 2  Lots of Energy -> No energy at all 4  TOTAL Score (max 40)  20        Results for JAMARIS, BIERNAT (MRN [id number]) as of 04/26/2018 10:02  Ref. Range 03/16/2018 03:48  Creatinine Latest Ref Range: 0.44 - 1.00 mg/dL 0.75  Results for KYNDAL, HERINGER (MRN [id number]) as of 04/26/2018 10:02  Ref. Range 03/16/2018 03:48  Hemoglobin  Latest Ref Range: 12.0 - 15.0 g/dL 14.3    Simple office walk 185 feet x  3 laps goal with forehead probe 04/26/2018   O2 used Room air  Number laps completed 3  Comments about pace Normal pace  Resting Pulse Ox/HR 100% and 85/min  Final Pulse Ox/HR 99% and 102/min  Desaturated </= 88% no  Desaturated <= 3% points no  Got Tachycardic >/= 90/min yes  Symptoms at end of test x  Miscellaneous comments x    IMPRESSION: CTA chest - personally visualized and agree Lungs/Pleura: Moderate bronchial thickening. No consolidation. No pulmonary edema or pleural fluid. Breathing motion artifact at the lung bases partially obscures evaluation. No pulmonary mass. 1. No pulmonary embolus. 2. Bronchial thickening suggesting bronchitis or smoking related lung disease, appears chronic based on prior radiographs.   Electronically  Signed   By: Jeb Levering M.D.   On: 03/16/2018 05:42  Nuclear medicine stress test 2012 0- reviewd  IMPRESSION: No evidence of prior infraction or pharmacologically induced ischemia.  Ejection fraction - 72%.  Original Report Authenticated By: Rachel Moulds, M.D.  has a past medical history of Anxiety, Barrett's esophagus (08/15/2015), Cervical cancer ([place]), Chronic headaches, Colon polyps, Costochondritis, Diverticulitis, Diverticulitis, Diverticulosis, GERD (gastroesophageal reflux disease), Sciatica, and Varicose veins.   reports that she has been smoking cigarettes.  She has a 43.50 pack-year smoking history. She has never used smokeless tobacco.  Past Surgical History:  Procedure Laterality Date  . COLONOSCOPY    . LEEP  90's  . TUBAL LIGATION    . UMBILICAL HERNIA REPAIR    . UPPER GASTROINTESTINAL ENDOSCOPY    . VARICOSE VEIN SURGERY     laser    Allergies  Allergen Reactions  . Ciprofloxacin Other (See Comments)    Severe headache from combination of Cipro and Buspar approx 2016 (stopped Cipro and issue was resolved)  . Codeine Other (See Comments)    Migraine.  . Tramadol Other (See Comments)    dizziness  . Hydrocodone Itching    Immunization History  Administered Date(s) Administered  . Tdap 05/07/2013    Family History  Problem Relation Age of Onset  . Seizures Mother   . Diabetes Mother   . Heart disease Mother        MI in her 35's  . Hyperlipidemia Mother   . Hypertension Mother   . Stroke Mother        34's  . Heart disease Father   . Diabetes Sister   . Hypertension Sister   . Thyroid disease Sister   . Cancer Sister 28       liver and stomach  . Heart disease Sister        enlarged heart and leaky valve (2014)  . Stomach cancer Sister   . Hypertension Brother        x 2  . Diabetes Brother        x 2  . Stroke Brother        40's  . Heart disease Brother   . Diabetes Sister   . Heart disease Sister 37       MI  .  Hyperlipidemia Sister   . Seizures Sister   . Hypertension Sister   . Heart disease Paternal Aunt   . Asthma Paternal Aunt   . COPD Paternal 26   . Breast cancer Neg Hx   . Colon cancer Neg Hx   . Esophageal cancer Neg Hx   .  Rectal cancer Neg Hx      Current Outpatient Medications:  .  albuterol (PROVENTIL HFA;VENTOLIN HFA) 108 (90 BASE) MCG/ACT inhaler, Inhale 2 puffs into the lungs every 4 (four) hours as needed for wheezing or shortness of breath., Disp: 1 Inhaler, Rfl: 0 .  ALPRAZolam (XANAX) 0.25 MG tablet, Take 0.125 mg by mouth at bedtime as needed for anxiety., Disp: , Rfl:  .  budesonide-formoterol (SYMBICORT) 80-4.5 MCG/ACT inhaler, Inhale 2 puffs into the lungs 2 (two) times daily., Disp: , Rfl:  .  ibuprofen (ADVIL,MOTRIN) 200 MG tablet, Take 400 mg by mouth every 6 (six) hours as needed (pain). Took 4 at once , Disp: , Rfl:     Review of Systems  Constitutional: Negative for fever and unexpected weight change.  HENT: Positive for sneezing and trouble swallowing. Negative for congestion, dental problem, ear pain, nosebleeds, postnasal drip, rhinorrhea, sinus pressure and sore throat.   Eyes: Negative for redness and itching.  Respiratory: Positive for cough, chest tightness, shortness of breath and wheezing.   Cardiovascular: Positive for chest pain, palpitations and leg swelling.  Gastrointestinal: Negative for nausea and vomiting.  Musculoskeletal: Negative for joint swelling.  Skin: Negative for rash.  Allergic/Immunologic: Negative.  Negative for environmental allergies, food allergies and immunocompromised state.  Neurological: Positive for headaches.  Hematological: Bruises/bleeds easily.  Psychiatric/Behavioral: Negative for dysphoric mood. The patient is nervous/anxious.        Objective:   Physical Exam  Constitutional: She is oriented to person, place, and time. She appears well-developed and well-nourished. No distress.  HENT:  Head: Normocephalic  and atraumatic.  Right Ear: External ear normal.  Left Ear: External ear normal.  Mouth/Throat: Oropharynx is clear and moist. No oropharyngeal exudate.  Mild tobacco smell in the oral cavity  Eyes: Pupils are equal, round, and reactive to light. Conjunctivae and EOM are normal. Right eye exhibits no discharge. Left eye exhibits no discharge. No scleral icterus.  Neck: Normal range of motion. Neck supple. No JVD present. No tracheal deviation present. No thyromegaly present.  Cardiovascular: Normal rate, regular rhythm, normal heart sounds and intact distal pulses. Exam reveals no gallop and no friction rub.  No murmur heard. Pulmonary/Chest: Effort normal and breath sounds normal. No respiratory distress. She has no wheezes. She has no rales. She exhibits no tenderness.  Abdominal: Soft. Bowel sounds are normal. She exhibits no distension and no mass. There is no tenderness. There is no rebound and no guarding.  Musculoskeletal: Normal range of motion. She exhibits no edema or tenderness.  Lymphadenopathy:    She has no cervical adenopathy.  Neurological: She is alert and oriented to person, place, and time. She has normal reflexes. No cranial nerve deficit. She exhibits normal muscle tone. Coordination normal.  Skin: Skin is warm and dry. No rash noted. She is not diaphoretic. No erythema. No pallor.  Psychiatric: She has a normal mood and affect. Her behavior is normal. Judgment and thought content normal.  Vitals reviewed.   Vitals:   04/26/18 1000  BP: 114/64  Pulse: 80  SpO2: 96%    Estimated body mass index is 33.89 kg/m as calculated from the following:   Height as of 03/16/18: 5\' 6"  (1.676 m).   Weight as of 03/16/18: 210 lb (95.3 kg).       Assessment & Plan:     ICD-10-CM   1. Other form of dyspnea R06.09 POCT EXHALED NITRIC OXIDE    Pulmonary function test  2. History of smoking 30  or more pack years Z87.891   3. Palpitations R00.2 Ambulatory referral to Cardiology    4. Wheezing R06.2    Other form of dyspnea History of smoking 30 or more pack years Wheezing  - symptoms despite symbicort present ? Uncontrolled copd, v diast dysfn v tied to palpitations. Overall, unclear cause  - add empiric inhaled anticholinergic - spiriva for 1 month - do full PFT in 1 month - let us regroup after this  Palpitations - refer cardiology   Snoring - assess ESS next visit  Followup 1 month full PFT Visit Dr Chase Caller or APP in 1 month; do CAT scoer and ESS score at followup If unimprioved then CPST bike test and sleep doc referral   Dr. Brand Males, M.D., [facility].C.P Pulmonary and Critical Care Medicine Staff Physician, [place] Director - Interstitial Lung Disease  Program  Pulmonary [place] at [place], [place], [address]  Pager: [phone], If no answer or between  15:00h - 7:00h: call [phone] Telephone: [phone]

## 2018-05-01 ENCOUNTER — Emergency Department (HOSPITAL_COMMUNITY): Payer: Medicaid Other

## 2018-05-01 ENCOUNTER — Emergency Department (HOSPITAL_COMMUNITY)
Admission: EM | Admit: 2018-05-01 | Discharge: 2018-05-01 | Disposition: A | Discharge status: Home / Self Care | Payer: Medicaid Other | Source: Home / Self Care

## 2018-05-01 ENCOUNTER — Encounter (HOSPITAL_COMMUNITY): Payer: Self-pay | Admitting: Family Medicine

## 2018-05-01 ENCOUNTER — Emergency Department (HOSPITAL_BASED_OUTPATIENT_CLINIC_OR_DEPARTMENT_OTHER)
Admission: EM | Admit: 2018-05-01 | Discharge: 2018-05-01 | Disposition: A | Discharge status: Home / Self Care | Payer: Medicaid Other | Attending: Emergency Medicine | Admitting: Emergency Medicine

## 2018-05-01 ENCOUNTER — Encounter (HOSPITAL_BASED_OUTPATIENT_CLINIC_OR_DEPARTMENT_OTHER): Payer: Self-pay | Admitting: Emergency Medicine

## 2018-05-01 ENCOUNTER — Other Ambulatory Visit: Payer: Self-pay

## 2018-05-01 ENCOUNTER — Emergency Department (HOSPITAL_COMMUNITY)
Admission: EM | Admit: 2018-05-01 | Discharge: 2018-05-02 | Disposition: A | Discharge status: Home / Self Care | Payer: Medicaid Other | Source: Home / Self Care | Attending: Emergency Medicine | Admitting: Emergency Medicine

## 2018-05-01 DIAGNOSIS — Z8541 Personal history of malignant neoplasm of cervix uteri: Secondary | ICD-10-CM | POA: Diagnosis not present

## 2018-05-01 DIAGNOSIS — R51 Headache: Secondary | ICD-10-CM | POA: Diagnosis not present

## 2018-05-01 DIAGNOSIS — F17210 Nicotine dependence, cigarettes, uncomplicated: Secondary | ICD-10-CM | POA: Diagnosis not present

## 2018-05-01 DIAGNOSIS — J449 Chronic obstructive pulmonary disease, unspecified: Secondary | ICD-10-CM | POA: Insufficient documentation

## 2018-05-01 DIAGNOSIS — R002 Palpitations: Secondary | ICD-10-CM

## 2018-05-01 DIAGNOSIS — Z79899 Other long term (current) drug therapy: Secondary | ICD-10-CM | POA: Diagnosis not present

## 2018-05-01 DIAGNOSIS — R519 Headache, unspecified: Secondary | ICD-10-CM

## 2018-05-01 DIAGNOSIS — Z5321 Procedure and treatment not carried out due to patient leaving prior to being seen by health care provider: Secondary | ICD-10-CM

## 2018-05-01 HISTORY — DX: Chronic obstructive pulmonary disease, unspecified: J44.9

## 2018-05-01 LAB — CBC
HEMATOCRIT: 44.5 % (ref 36.0–46.0)
Hemoglobin: 15.2 g/dL — ABNORMAL HIGH (ref 12.0–15.0)
MCH: 31 pg (ref 26.0–34.0)
MCHC: 34.2 g/dL (ref 30.0–36.0)
MCV: 90.8 fL (ref 78.0–100.0)
Platelets: 231 10*3/uL (ref 150–400)
RBC: 4.9 MIL/uL (ref 3.87–5.11)
RDW: 12.9 % (ref 11.5–15.5)
WBC: 8 10*3/uL (ref 4.0–10.5)

## 2018-05-01 LAB — BASIC METABOLIC PANEL
Anion gap: 9 (ref 5–15)
BUN: 9 mg/dL (ref 6–20)
CHLORIDE: 106 mmol/L (ref 101–111)
CO2: 25 mmol/L (ref 22–32)
Calcium: 9.1 mg/dL (ref 8.9–10.3)
Creatinine, Ser: 0.76 mg/dL (ref 0.44–1.00)
GFR calc Af Amer: >60 mL/min (ref >60)
GFR calc non Af Amer: >60 mL/min (ref >60)
GLUCOSE: 106 mg/dL — AB (ref 65–99)
POTASSIUM: 4.1 mmol/L (ref 3.5–5.1)
Sodium: 140 mmol/L (ref 135–145)

## 2018-05-01 LAB — I-STAT BETA HCG BLOOD, ED (MC, WL, AP ONLY): I-stat hCG, quantitative: <5 m[IU]/mL (ref <5)

## 2018-05-01 LAB — I-STAT TROPONIN, ED: Troponin i, poc: 0.01 ng/mL (ref 0.00–0.08)

## 2018-05-01 NOTE — ED Triage Notes (Signed)
Patient reports she had had intermittent heart palpitations for the last two years but started experiencing this on Friday. Also, reports she has a headache at her vertex. Denies any associated symptoms such as nausea, vomiting, neck pain, or photo sensitivity.

## 2018-05-01 NOTE — ED Triage Notes (Signed)
Pt c/o headache x4 days. Pt reports palpitations for a long time and has been evaluated by PMD and pulmonology for same.

## 2018-05-02 MED ORDER — DIPHENHYDRAMINE HCL 50 MG/ML IJ SOLN
25.0000 mg | Freq: Once | INTRAMUSCULAR | Status: DC
  Filled 2018-05-02: qty 1

## 2018-05-02 MED ORDER — ACETAMINOPHEN 500 MG PO TABS
1000.0000 mg | ORAL_TABLET | Freq: Once | ORAL | Status: AC
  Administered 2018-05-02: 1000 mg via ORAL
  Filled 2018-05-02: qty 2

## 2018-05-02 MED ORDER — METOPROLOL TARTRATE 25 MG PO TABS: 12.5000 mg | ORAL_TABLET | Freq: Two times a day (BID) | ORAL | 0 refills | Status: DC | PRN

## 2018-05-02 MED ORDER — DEXAMETHASONE SODIUM PHOSPHATE 10 MG/ML IJ SOLN
10.0000 mg | Freq: Once | INTRAMUSCULAR | Status: DC
  Filled 2018-05-02: qty 1

## 2018-05-02 MED ORDER — IBUPROFEN 800 MG PO TABS
800.0000 mg | ORAL_TABLET | Freq: Once | ORAL | Status: AC
  Administered 2018-05-02: 800 mg via ORAL
  Filled 2018-05-02: qty 1

## 2018-05-02 MED ORDER — KETOROLAC TROMETHAMINE 30 MG/ML IJ SOLN
15.0000 mg | Freq: Once | INTRAMUSCULAR | Status: DC
  Filled 2018-05-02: qty 1

## 2018-05-02 MED ORDER — SODIUM CHLORIDE 0.9 % IV BOLUS: 500.0000 mL | Freq: Once | INTRAVENOUS | Status: DC

## 2018-05-02 NOTE — ED Notes (Signed)
Nurse entered pt room to start IV and give medications. Explained to pt what the medications were and why they were ordered. Pt then refused IV and medications. Pt said she would be driving herself home and did not want to take anything that would make her sleepy. Pt stated "Why can't I just take an Advil and go home?". MD made aware.
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
## 2018-05-03 ENCOUNTER — Inpatient Hospital Stay (HOSPITAL_COMMUNITY)
Admission: EM | Admit: 2018-05-03 | Discharge: 2018-05-07 | DRG: 392 | Disposition: A | Discharge status: Home / Self Care | Payer: Medicaid Other | Attending: Internal Medicine | Admitting: Internal Medicine

## 2018-05-03 ENCOUNTER — Encounter (HOSPITAL_COMMUNITY): Payer: Self-pay | Admitting: Emergency Medicine

## 2018-05-03 DIAGNOSIS — R0902 Hypoxemia: Secondary | ICD-10-CM | POA: Diagnosis present

## 2018-05-03 DIAGNOSIS — K219 Gastro-esophageal reflux disease without esophagitis: Secondary | ICD-10-CM | POA: Diagnosis present

## 2018-05-03 DIAGNOSIS — Z823 Family history of stroke: Secondary | ICD-10-CM

## 2018-05-03 DIAGNOSIS — K5720 Diverticulitis of large intestine with perforation and abscess without bleeding: Principal | ICD-10-CM | POA: Diagnosis present

## 2018-05-03 DIAGNOSIS — K5732 Diverticulitis of large intestine without perforation or abscess without bleeding: Secondary | ICD-10-CM | POA: Diagnosis present

## 2018-05-03 DIAGNOSIS — Z833 Family history of diabetes mellitus: Secondary | ICD-10-CM

## 2018-05-03 DIAGNOSIS — K2270 Barrett's esophagus without dysplasia: Secondary | ICD-10-CM | POA: Diagnosis present

## 2018-05-03 DIAGNOSIS — Z800 Family history of malignant neoplasm of digestive organs: Secondary | ICD-10-CM

## 2018-05-03 DIAGNOSIS — Z8541 Personal history of malignant neoplasm of cervix uteri: Secondary | ICD-10-CM

## 2018-05-03 DIAGNOSIS — Z720 Tobacco use: Secondary | ICD-10-CM | POA: Diagnosis present

## 2018-05-03 DIAGNOSIS — Z8249 Family history of ischemic heart disease and other diseases of the circulatory system: Secondary | ICD-10-CM

## 2018-05-03 DIAGNOSIS — Z885 Allergy status to narcotic agent status: Secondary | ICD-10-CM

## 2018-05-03 DIAGNOSIS — Z825 Family history of asthma and other chronic lower respiratory diseases: Secondary | ICD-10-CM

## 2018-05-03 DIAGNOSIS — J449 Chronic obstructive pulmonary disease, unspecified: Secondary | ICD-10-CM | POA: Diagnosis present

## 2018-05-03 DIAGNOSIS — B373 Candidiasis of vulva and vagina: Secondary | ICD-10-CM | POA: Diagnosis present

## 2018-05-03 DIAGNOSIS — Z86010 Personal history of colonic polyps: Secondary | ICD-10-CM

## 2018-05-03 DIAGNOSIS — Z8349 Family history of other endocrine, nutritional and metabolic diseases: Secondary | ICD-10-CM

## 2018-05-03 DIAGNOSIS — Z881 Allergy status to other antibiotic agents status: Secondary | ICD-10-CM

## 2018-05-03 DIAGNOSIS — F419 Anxiety disorder, unspecified: Secondary | ICD-10-CM | POA: Diagnosis present

## 2018-05-03 DIAGNOSIS — F17210 Nicotine dependence, cigarettes, uncomplicated: Secondary | ICD-10-CM | POA: Diagnosis present

## 2018-05-03 LAB — COMPREHENSIVE METABOLIC PANEL
ALT: 14 U/L (ref 14–54)
AST: 14 U/L — AB (ref 15–41)
Albumin: 3.9 g/dL (ref 3.5–5.0)
Alkaline Phosphatase: 61 U/L (ref 38–126)
Anion gap: 9 (ref 5–15)
BUN: 8 mg/dL (ref 6–20)
CHLORIDE: 107 mmol/L (ref 101–111)
CO2: 24 mmol/L (ref 22–32)
Calcium: 8.8 mg/dL — ABNORMAL LOW (ref 8.9–10.3)
Creatinine, Ser: 0.78 mg/dL (ref 0.44–1.00)
Glucose, Bld: 94 mg/dL (ref 65–99)
POTASSIUM: 3.9 mmol/L (ref 3.5–5.1)
SODIUM: 140 mmol/L (ref 135–145)
Total Bilirubin: 1.2 mg/dL (ref 0.3–1.2)
Total Protein: 6.4 g/dL — ABNORMAL LOW (ref 6.5–8.1)

## 2018-05-03 LAB — I-STAT BETA HCG BLOOD, ED (MC, WL, AP ONLY)

## 2018-05-03 LAB — URINALYSIS, ROUTINE W REFLEX MICROSCOPIC
BACTERIA UA: NONE SEEN
Bilirubin Urine: NEGATIVE
Glucose, UA: NEGATIVE mg/dL
KETONES UR: 5 mg/dL — AB
Leukocytes, UA: NEGATIVE
Nitrite: NEGATIVE
Protein, ur: NEGATIVE mg/dL
Specific Gravity, Urine: 1.011 (ref 1.005–1.030)
pH: 5 (ref 5.0–8.0)

## 2018-05-03 LAB — CBC
HEMATOCRIT: 44.5 % (ref 36.0–46.0)
Hemoglobin: 15 g/dL (ref 12.0–15.0)
MCH: 30.9 pg (ref 26.0–34.0)
MCHC: 33.7 g/dL (ref 30.0–36.0)
MCV: 91.6 fL (ref 78.0–100.0)
Platelets: 202 10*3/uL (ref 150–400)
RBC: 4.86 MIL/uL (ref 3.87–5.11)
RDW: 12.6 % (ref 11.5–15.5)
WBC: 10.5 10*3/uL (ref 4.0–10.5)

## 2018-05-03 LAB — LIPASE, BLOOD: LIPASE: 31 U/L (ref 11–51)

## 2018-05-03 NOTE — ED Triage Notes (Signed)
Pt reports generalized lower abd pain.  Pt stated it started yesterday after dinner.  Pt denies n/v/d

## 2018-05-04 ENCOUNTER — Encounter (HOSPITAL_COMMUNITY): Payer: Self-pay | Admitting: *Deleted

## 2018-05-04 ENCOUNTER — Emergency Department (HOSPITAL_COMMUNITY): Payer: Medicaid Other

## 2018-05-04 ENCOUNTER — Other Ambulatory Visit: Payer: Self-pay

## 2018-05-04 DIAGNOSIS — Z86010 Personal history of colonic polyps: Secondary | ICD-10-CM | POA: Diagnosis not present

## 2018-05-04 DIAGNOSIS — B373 Candidiasis of vulva and vagina: Secondary | ICD-10-CM | POA: Diagnosis present

## 2018-05-04 DIAGNOSIS — Z881 Allergy status to other antibiotic agents status: Secondary | ICD-10-CM | POA: Diagnosis not present

## 2018-05-04 DIAGNOSIS — Z8249 Family history of ischemic heart disease and other diseases of the circulatory system: Secondary | ICD-10-CM | POA: Diagnosis not present

## 2018-05-04 DIAGNOSIS — K2270 Barrett's esophagus without dysplasia: Secondary | ICD-10-CM | POA: Diagnosis present

## 2018-05-04 DIAGNOSIS — F419 Anxiety disorder, unspecified: Secondary | ICD-10-CM | POA: Diagnosis present

## 2018-05-04 DIAGNOSIS — Z823 Family history of stroke: Secondary | ICD-10-CM | POA: Diagnosis not present

## 2018-05-04 DIAGNOSIS — Z8541 Personal history of malignant neoplasm of cervix uteri: Secondary | ICD-10-CM | POA: Diagnosis not present

## 2018-05-04 DIAGNOSIS — K5732 Diverticulitis of large intestine without perforation or abscess without bleeding: Secondary | ICD-10-CM

## 2018-05-04 DIAGNOSIS — Z800 Family history of malignant neoplasm of digestive organs: Secondary | ICD-10-CM | POA: Diagnosis not present

## 2018-05-04 DIAGNOSIS — J449 Chronic obstructive pulmonary disease, unspecified: Secondary | ICD-10-CM | POA: Diagnosis present

## 2018-05-04 DIAGNOSIS — Z825 Family history of asthma and other chronic lower respiratory diseases: Secondary | ICD-10-CM | POA: Diagnosis not present

## 2018-05-04 DIAGNOSIS — R0902 Hypoxemia: Secondary | ICD-10-CM | POA: Diagnosis present

## 2018-05-04 DIAGNOSIS — K219 Gastro-esophageal reflux disease without esophagitis: Secondary | ICD-10-CM | POA: Diagnosis present

## 2018-05-04 DIAGNOSIS — K5792 Diverticulitis of intestine, part unspecified, without perforation or abscess without bleeding: Secondary | ICD-10-CM | POA: Diagnosis not present

## 2018-05-04 DIAGNOSIS — Z833 Family history of diabetes mellitus: Secondary | ICD-10-CM | POA: Diagnosis not present

## 2018-05-04 DIAGNOSIS — K5720 Diverticulitis of large intestine with perforation and abscess without bleeding: Secondary | ICD-10-CM | POA: Diagnosis not present

## 2018-05-04 DIAGNOSIS — Z8349 Family history of other endocrine, nutritional and metabolic diseases: Secondary | ICD-10-CM | POA: Diagnosis not present

## 2018-05-04 DIAGNOSIS — Z885 Allergy status to narcotic agent status: Secondary | ICD-10-CM | POA: Diagnosis not present

## 2018-05-04 DIAGNOSIS — F17210 Nicotine dependence, cigarettes, uncomplicated: Secondary | ICD-10-CM | POA: Diagnosis present

## 2018-05-04 LAB — CBC
HEMATOCRIT: 42.6 % (ref 36.0–46.0)
Hemoglobin: 14.2 g/dL (ref 12.0–15.0)
MCH: 30.8 pg (ref 26.0–34.0)
MCHC: 33.3 g/dL (ref 30.0–36.0)
MCV: 92.4 fL (ref 78.0–100.0)
PLATELETS: 191 10*3/uL (ref 150–400)
RBC: 4.61 MIL/uL (ref 3.87–5.11)
RDW: 12.6 % (ref 11.5–15.5)
WBC: 7.1 10*3/uL (ref 4.0–10.5)

## 2018-05-04 LAB — CREATININE, SERUM: CREATININE: 0.71 mg/dL (ref 0.44–1.00)

## 2018-05-04 MED ORDER — IOHEXOL 300 MG/ML  SOLN
100.0000 mL | Freq: Once | INTRAMUSCULAR | Status: AC | PRN
  Administered 2018-05-04: 100 mL via INTRAVENOUS

## 2018-05-04 MED ORDER — SODIUM CHLORIDE 0.9 % IV BOLUS
1000.0000 mL | Freq: Once | INTRAVENOUS | Status: AC
  Administered 2018-05-04: 1000 mL via INTRAVENOUS

## 2018-05-04 MED ORDER — ONDANSETRON HCL 4 MG/2ML IJ SOLN: 4.0000 mg | Freq: Four times a day (QID) | INTRAMUSCULAR | Status: DC | PRN

## 2018-05-04 MED ORDER — ONDANSETRON HCL 4 MG/2ML IJ SOLN
4.0000 mg | Freq: Once | INTRAMUSCULAR | Status: AC
  Administered 2018-05-04: 4 mg via INTRAVENOUS
  Filled 2018-05-04: qty 2

## 2018-05-04 MED ORDER — OXYCODONE HCL 5 MG PO TABS
5.0000 mg | ORAL_TABLET | ORAL | Status: DC | PRN
  Administered 2018-05-04 – 2018-05-06 (×5): 5 mg via ORAL
  Filled 2018-05-04 (×6): qty 1

## 2018-05-04 MED ORDER — ALPRAZOLAM 0.25 MG PO TABS
0.1250 mg | ORAL_TABLET | Freq: Every evening | ORAL | Status: DC | PRN
  Administered 2018-05-06: 0.125 mg via ORAL
  Filled 2018-05-04: qty 1

## 2018-05-04 MED ORDER — POLYETHYLENE GLYCOL 3350 17 G PO PACK: 17.0000 g | PACK | Freq: Every day | ORAL | Status: DC | PRN

## 2018-05-04 MED ORDER — NICOTINE POLACRILEX 2 MG MT GUM
2.0000 mg | CHEWING_GUM | OROMUCOSAL | Status: DC | PRN
  Filled 2018-05-04 (×2): qty 1

## 2018-05-04 MED ORDER — FENTANYL CITRATE (PF) 100 MCG/2ML IJ SOLN
50.0000 ug | Freq: Once | INTRAMUSCULAR | Status: DC
  Filled 2018-05-04: qty 2

## 2018-05-04 MED ORDER — POTASSIUM CHLORIDE IN NACL 40-0.9 MEQ/L-% IV SOLN
INTRAVENOUS | Status: DC
  Administered 2018-05-04 – 2018-05-05 (×2): 125 mL/h via INTRAVENOUS
  Filled 2018-05-04 (×4): qty 1000

## 2018-05-04 MED ORDER — IPRATROPIUM-ALBUTEROL 0.5-2.5 (3) MG/3ML IN SOLN: 3.0000 mL | Freq: Four times a day (QID) | RESPIRATORY_TRACT | Status: DC

## 2018-05-04 MED ORDER — PIPERACILLIN-TAZOBACTAM 3.375 G IVPB
3.3750 g | Freq: Three times a day (TID) | INTRAVENOUS | Status: DC
  Administered 2018-05-04 – 2018-05-07 (×9): 3.375 g via INTRAVENOUS
  Filled 2018-05-04 (×11): qty 50

## 2018-05-04 MED ORDER — NICOTINE 14 MG/24HR TD PT24
14.0000 mg | MEDICATED_PATCH | Freq: Every day | TRANSDERMAL | Status: DC
  Administered 2018-05-04 – 2018-05-06 (×3): 14 mg via TRANSDERMAL
  Filled 2018-05-04 (×4): qty 1

## 2018-05-04 MED ORDER — METOPROLOL TARTRATE 12.5 MG HALF TABLET: 12.5000 mg | ORAL_TABLET | Freq: Two times a day (BID) | ORAL | Status: DC | PRN

## 2018-05-04 MED ORDER — ENOXAPARIN SODIUM 40 MG/0.4ML ~~LOC~~ SOLN
40.0000 mg | SUBCUTANEOUS | Status: DC
  Administered 2018-05-04 – 2018-05-05 (×2): 40 mg via SUBCUTANEOUS
  Filled 2018-05-04 (×5): qty 0.4

## 2018-05-04 MED ORDER — IBUPROFEN 600 MG PO TABS: 600.0000 mg | ORAL_TABLET | Freq: Four times a day (QID) | ORAL | Status: DC | PRN

## 2018-05-04 MED ORDER — MORPHINE SULFATE (PF) 4 MG/ML IV SOLN: 2.0000 mg | INTRAVENOUS | Status: DC | PRN

## 2018-05-04 MED ORDER — FENTANYL CITRATE (PF) 100 MCG/2ML IJ SOLN
50.0000 ug | Freq: Once | INTRAMUSCULAR | Status: AC
  Administered 2018-05-04: 50 ug via INTRAVENOUS
  Filled 2018-05-04: qty 2

## 2018-05-04 MED ORDER — PIPERACILLIN-TAZOBACTAM 3.375 G IVPB 30 MIN
3.3750 g | Freq: Once | INTRAVENOUS | Status: AC
  Administered 2018-05-04: 3.375 g via INTRAVENOUS
  Filled 2018-05-04: qty 50

## 2018-05-04 MED ORDER — IPRATROPIUM-ALBUTEROL 0.5-2.5 (3) MG/3ML IN SOLN: 3.0000 mL | Freq: Four times a day (QID) | RESPIRATORY_TRACT | Status: DC | PRN

## 2018-05-04 MED ORDER — PANTOPRAZOLE SODIUM 40 MG PO TBEC
40.0000 mg | DELAYED_RELEASE_TABLET | Freq: Two times a day (BID) | ORAL | Status: DC
  Administered 2018-05-04 – 2018-05-07 (×7): 40 mg via ORAL
  Filled 2018-05-04 (×7): qty 1

## 2018-05-04 MED ORDER — ONDANSETRON HCL 4 MG PO TABS: 4.0000 mg | ORAL_TABLET | Freq: Four times a day (QID) | ORAL | Status: DC | PRN

## 2018-05-04 NOTE — Care Management (Signed)
This is a no charge note  Pending admission per Dr. Acquanetta Belling  52 year old lady with a past medical history of diverticulitis, COPD, GERD, anxiety, tobacco abuse, who presents with abdominal pain for 2 days.  Found to have acute diverticulitis and a small 1.8 cm abscess without evidence of perforation on CT scan.  WBC 10.5, temperature normal.  Probably need IR consultation for drainage.  I accepted patient, but did not put admission orders in.    Ivor Costa, MD  [facility] Pager [phone]  If 7PM-7AM, please contact night-coverage [email or web address] Password [facility] 05/04/2018, 6:53 AM

## 2018-05-04 NOTE — ED Notes (Signed)
Pt aware may eat now - regular diet. Pt eating hot dogs brought by her sister.
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
## 2018-05-04 NOTE — ED Notes (Signed)
Pt being transported to [facility]M07 via w/c. Family w/pt.

## 2018-05-04 NOTE — ED Notes (Signed)
Attempted to call report. RN will call back. 
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
## 2018-05-04 NOTE — ED Notes (Signed)
Pt sitting on bed watching tv.

## 2018-05-04 NOTE — Progress Notes (Signed)
Pharmacy Antibiotic Note  Christine Shah is a 52 y.o. female admitted on 05/03/2018 with intra-abdominal infection.  Pharmacy has been consulted for Zosyn dosing.  Received one time dose of Zosyn in the ED at 0630 this am.  Plan: Start Zosyn 3.375 gm IV q8h (4 hour infusion) Monitor clinical picture, renal function F/U abx deescalation / LOT   Height: 5\' 6"  (167.6 cm) Weight: 213 lb (96.6 kg) IBW/kg (Calculated) : 59.3  Temp (24hrs), Avg:98.4 F (36.9 C), Min:98.4 F (36.9 C), Max:98.4 F (36.9 C)  Recent Labs  Lab 05/01/18 2107 05/03/18 2159  WBC 8.0 10.5  CREATININE 0.76 0.78    Estimated Creatinine Clearance: 99.6 mL/min (by C-G formula based on SCr of 0.78 mg/dL).    Allergies  Allergen Reactions  . Ciprofloxacin Other (See Comments)    Severe headache from combination of Cipro and Buspar approx 2016 (stopped Cipro and issue was resolved)  . Codeine Other (See Comments)    Migraine.  . Tramadol Other (See Comments)    dizziness  . Hydrocodone Itching    Antimicrobials this admission: Zosyn 6/1 >>   Dose adjustments this admission: n/a  Microbiology results: N/a  Thank you for allowing pharmacy to be a part of this patient's care.  Reginia Naas 05/04/2018 9:10 AM

## 2018-05-04 NOTE — ED Notes (Signed)
Pt updated on wait for treatment room and moved to a quieter place in waiting area per request.

## 2018-05-04 NOTE — ED Notes (Signed)
Family at bedside - pt and family aware pt has been bed assigned.
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
## 2018-05-05 DIAGNOSIS — K5792 Diverticulitis of intestine, part unspecified, without perforation or abscess without bleeding: Secondary | ICD-10-CM

## 2018-05-05 DIAGNOSIS — Z720 Tobacco use: Secondary | ICD-10-CM

## 2018-05-05 DIAGNOSIS — K219 Gastro-esophageal reflux disease without esophagitis: Secondary | ICD-10-CM

## 2018-05-05 DIAGNOSIS — K2270 Barrett's esophagus without dysplasia: Secondary | ICD-10-CM

## 2018-05-05 DIAGNOSIS — F419 Anxiety disorder, unspecified: Secondary | ICD-10-CM

## 2018-05-05 DIAGNOSIS — K5720 Diverticulitis of large intestine with perforation and abscess without bleeding: Principal | ICD-10-CM

## 2018-05-05 LAB — CBC
HCT: 39.7 % (ref 36.0–46.0)
Hemoglobin: 13.1 g/dL (ref 12.0–15.0)
MCH: 31 pg (ref 26.0–34.0)
MCHC: 33 g/dL (ref 30.0–36.0)
MCV: 93.9 fL (ref 78.0–100.0)
PLATELETS: 162 10*3/uL (ref 150–400)
RBC: 4.23 MIL/uL (ref 3.87–5.11)
RDW: 12.7 % (ref 11.5–15.5)
WBC: 5.7 10*3/uL (ref 4.0–10.5)

## 2018-05-05 LAB — BASIC METABOLIC PANEL
Anion gap: 8 (ref 5–15)
CALCIUM: 8.2 mg/dL — AB (ref 8.9–10.3)
CO2: 23 mmol/L (ref 22–32)
CREATININE: 0.75 mg/dL (ref 0.44–1.00)
Chloride: 107 mmol/L (ref 101–111)
GFR calc Af Amer: >60 mL/min (ref >60)
GLUCOSE: 81 mg/dL (ref 65–99)
POTASSIUM: 3.8 mmol/L (ref 3.5–5.1)
Sodium: 138 mmol/L (ref 135–145)

## 2018-05-05 LAB — HIV ANTIBODY (ROUTINE TESTING W REFLEX): HIV Screen 4th Generation wRfx: NONREACTIVE

## 2018-05-05 MED ORDER — POTASSIUM CHLORIDE IN NACL 40-0.9 MEQ/L-% IV SOLN
INTRAVENOUS | Status: DC
  Administered 2018-05-05 (×2): 75 mL/h via INTRAVENOUS
  Filled 2018-05-05 (×2): qty 1000

## 2018-05-05 NOTE — Progress Notes (Signed)
PROGRESS NOTE   Christine Shah  QTM:[id number]    DOB: 1966-09-08    DOA: 05/03/2018  PCP: Maryella Shivers, MD   I have briefly reviewed patients previous medical records in [facility].  Brief Narrative:  52 year old female with PMH of anxiety, Barrett's esophagus, remote cervical cancer, COPD, ongoing tobacco abuse, diverticulitis, GERD presented to ED with complaints of abdominal pain for couple days prior to admission and admitted for acute diverticulitis with small intramural abscess.  Treating conservatively.  General surgery consulted.   Assessment & Plan:   Principal Problem:   Diverticulitis large intestine w/o perforation or abscess w/o bleeding Active Problems:   Anxiety disorder   GERD (gastroesophageal reflux disease)   Tobacco abuse   Barrett's esophagus   Acute sigmoid diverticulitis with small intramural abscess: General surgery consulted.  Treating conservatively with bowel rest/clear liquids, IV fluids, IV Zosyn, pain management.  Slightly better.  Reassess in a.m. and if better than advance diet gradually.  Last colonoscopy in system 11/04/2011 by Dr. Earlean Shawl which showed colonic polyps.  Recommend outpatient follow-up with GI to consider screening colonoscopy after acute phase has completely resolved.  Tobacco abuse: Cessation counseled.  Continue nicotine patch.  COPD: Stable without clinical bronchospasm.  No hypoxia noted.  GERD/Barrett's esophagus: Continue PPI.  Barrett's confirmed by EGD/Dr. Carlean Purl September 2016 and needs to follow-up for repeat EGD later this year.  Anxiety disorder: Continue Xanax.  History of thyroid nodules: Status post biopsy in December 2018 showing benign follicular lesions.  Unclear why she is on metoprolol.     DVT prophylaxis: Lovenox Code Status: Full Family Communication: None at bedside Disposition: DC home pending clinical improvement, possibly in 48 to 72 hours.   Consultants:  General surgery  Procedures:    None  Antimicrobials:  IV Zosyn   Subjective: Patient reports feeling somewhat better compared to yesterday.  Less abdominal pain.  No nausea or vomiting.  Tolerating clear liquids but does not like it and wants real food.  Passing flatus.  Last BM 05/03/2018.  Reports not having BMs daily PTA.  ROS: As above otherwise negative  Objective:  Vitals:   05/04/18 1746 05/04/18 2012 05/05/18 0416 05/05/18 0943  BP: 112/65 109/75 109/62 (!) 104/59  Pulse: 65 64 72 73  Resp: 16 18 16 20   Temp: 98.5 F (36.9 C) 98.7 F (37.1 C) 98.4 F (36.9 C) 98.1 F (36.7 C)  TempSrc: Oral Oral Oral Oral  SpO2: 97% 98% 95% 95%  Weight:   96 kg (211 lb 10.3 oz)   Height:        Examination:  General exam: Pleasant young female, moderately built and obese, sitting up comfortably in bed. Respiratory system: Clear to auscultation. Respiratory effort normal. Cardiovascular system: S1 & S2 heard, RRR. No JVD, murmurs, rubs, gallops or clicks. No pedal edema. Gastrointestinal system: Abdomen is nondistended, soft.  Minimal tenderness in the lower quadrants without rigidity, guarding or rebound. No organomegaly or masses felt. Normal bowel sounds heard. Central nervous system: Alert and oriented. No focal neurological deficits. Extremities: Symmetric 5 x 5 power. Skin: No rashes, lesions or ulcers Psychiatry: Judgement and insight appear normal. Mood & affect appropriate.     Data Reviewed: I have personally reviewed following labs and imaging studies  CBC: Recent Labs  Lab 05/01/18 2107 05/03/18 2159 05/04/18 1021 05/05/18 0744  WBC 8.0 10.5 7.1 5.7  HGB 15.2* 15.0 14.2 13.1  HCT 44.5 44.5 42.6 39.7  MCV 90.8 91.6 92.4 93.9  PLT 231 202 191 154   Basic Metabolic Panel: Recent Labs  Lab 05/01/18 2107 05/03/18 2159 05/04/18 1021 05/05/18 0744  NA 140 140  --  138  K 4.1 3.9  --  3.8  CL 106 107  --  107  CO2 25 24  --  23  GLUCOSE 106* 94  --  81  BUN 9 8  --  <5*  CREATININE  0.76 0.78 0.71 0.75  CALCIUM 9.1 8.8*  --  8.2*   Liver Function Tests: Recent Labs  Lab 05/03/18 2159  AST 14*  ALT 14  ALKPHOS 61  BILITOT 1.2  PROT 6.4*  ALBUMIN 3.9    Radiology Studies: Ct Abdomen Pelvis W Contrast  Result Date: 05/04/2018 CLINICAL DATA:  Acute onset of lower abdominal pain. EXAM: CT ABDOMEN AND PELVIS WITH CONTRAST TECHNIQUE: Multidetector CT imaging of the abdomen and pelvis was performed using the standard protocol following bolus administration of intravenous contrast. CONTRAST:  156mL OMNIPAQUE IOHEXOL 300 MG/ML  SOLN COMPARISON:  CT of the abdomen and pelvis performed 10/31/2016 FINDINGS: Lower chest: The visualized lung bases are grossly clear. The visualized portions of the mediastinum are unremarkable. Hepatobiliary: A 4 mm nonspecific hypodensity is noted at the right hepatic lobe. The liver is otherwise unremarkable in appearance. The gallbladder is within normal limits. The common bile duct is normal in caliber. Pancreas: The pancreas is within normal limits. Spleen: The spleen is unremarkable in appearance. Adrenals/Urinary Tract: The adrenal glands are unremarkable in appearance. The kidneys are within normal limits. There is no evidence of hydronephrosis. No renal or ureteral stones are identified. No perinephric stranding is seen. Stomach/Bowel: The stomach is unremarkable in appearance. The small bowel is within normal limits. The appendix is not visualized; there is no evidence for appendicitis. Wall thickening is noted at the proximal sigmoid colon, with surrounding soft tissue inflammation. There appears to be a small 1.8 cm wall abscess. There is no definite evidence of perforation. Findings are compatible with acute diverticulitis. Vascular/Lymphatic: The abdominal aorta is unremarkable in appearance. The inferior vena cava is grossly unremarkable. No retroperitoneal lymphadenopathy is seen. No pelvic sidewall lymphadenopathy is identified. Reproductive:  The bladder is mildly distended and grossly unremarkable. The uterus is unremarkable in appearance. Trace free fluid within the pelvis likely physiologic in nature. No suspicious adnexal masses are seen. Other: No additional soft tissue abnormalities are seen. Musculoskeletal: No acute osseous abnormalities are identified. The visualized musculature is unremarkable in appearance. IMPRESSION: 1. Acute diverticulitis noted at the proximal sigmoid colon, with surrounding soft tissue inflammation. Small 1.8 cm wall abscess noted. No definite evidence of perforation. 2. 4 mm nonspecific hypodensity at the right hepatic lobe is likely benign but not well characterized given its size. Electronically Signed   By: Garald Balding M.D.   On: 05/04/2018 06:08        Scheduled Meds: . enoxaparin (LOVENOX) injection  40 mg Subcutaneous Q24H  . fentaNYL (SUBLIMAZE) injection  50 mcg Intravenous Once  . nicotine  14 mg Transdermal Daily  . pantoprazole  40 mg Oral BID   Continuous Infusions: . 0.9 % NaCl with KCl 40 mEq / L 125 mL/hr (05/05/18 0149)  . piperacillin-tazobactam (ZOSYN)  IV Stopped (05/05/18 1019)     LOS: 1 day     Vernell Leep, MD, FACP, [facility]. [facility] Pager [phone]  If 7PM-7AM, please contact night-coverage [email or web address] Password [facility] 05/05/2018, 10:41 AM

## 2018-05-05 NOTE — Progress Notes (Signed)
  Progress Note: General Surgery Service   Assessment/Plan: Patient Active Problem List   Diagnosis Date Noted  . Diverticulitis large intestine w/o perforation or abscess w/o bleeding 05/04/2018  . Barrett's esophagus 08/15/2015  . Left upper quadrant pain 07/01/2015  . Abdominal pain, epigastric 06/03/2015  . LUQ abdominal pain 06/03/2015  . Esophageal reflux 06/03/2015  . Episodic tension-type headache, not intractable 05/25/2015  . Burning sensation of mouth 05/25/2015  . Tobacco abuse 05/25/2015  . History of migraine 03/26/2015  . Abnormality of gait 07/08/2013  . Blurred vision 07/08/2013  . GERD (gastroesophageal reflux disease) 01/28/2013  . Tobacco use disorder 07/30/2012  . Anxiety disorder 12/28/2011   Diverticulitis of colon -continue abx -continue clear liquids -ok to ambulate    LOS: 1 day  Chief Complaint/Subjective: Sleepy this morning, pain about the same  Objective: Vital signs in last 24 hours: Temp:  [98.4 F (36.9 C)-98.7 F (37.1 C)] 98.4 F (36.9 C) (06/02 0416) Pulse Rate:  [64-85] 72 (06/02 0416) Resp:  [16-18] 16 (06/02 0416) BP: (107-112)/(62-75) 109/62 (06/02 0416) SpO2:  [95 %-98 %] 95 % (06/02 0416) Weight:  [96 kg (211 lb 10.3 oz)] 96 kg (211 lb 10.3 oz) (06/02 0416)    Intake/Output from previous day: 06/01 0701 - 06/02 0700 In: 3020 [P.O.:720; I.V.:2100; IV Piggyback:200] Out: 0  Intake/Output this shift: No intake/output data recorded.  Lungs: CTAB  Cardiovascular: RRR  Abd: tender to palpation lower abdomen  Extremities: no edema  Neuro: AOx4  Lab Results: CBC  Recent Labs    05/04/18 1021 05/05/18 0744  WBC 7.1 5.7  HGB 14.2 13.1  HCT 42.6 39.7  PLT 191 162   BMET Recent Labs    05/03/18 2159 05/04/18 1021  NA 140  --   K 3.9  --   CL 107  --   CO2 24  --   GLUCOSE 94  --   BUN 8  --   CREATININE 0.78 0.71  CALCIUM 8.8*  --    PT/INR No results for input(s): LABPROT, INR in the last 72  hours. ABG No results for input(s): PHART, HCO3 in the last 72 hours.  Invalid input(s): PCO2, PO2  Studies/Results:  Anti-infectives: Anti-infectives (From admission, onward)   Start     Dose/Rate Route Frequency Ordered Stop   05/04/18 1300  piperacillin-tazobactam (ZOSYN) IVPB 3.375 g     3.375 g 12.5 mL/hr over 240 Minutes Intravenous Every 8 hours 05/04/18 0910     05/04/18 0630  piperacillin-tazobactam (ZOSYN) IVPB 3.375 g     3.375 g 100 mL/hr over 30 Minutes Intravenous  Once 05/04/18 0621 05/04/18 0706      Medications: Scheduled Meds: . enoxaparin (LOVENOX) injection  40 mg Subcutaneous Q24H  . fentaNYL (SUBLIMAZE) injection  50 mcg Intravenous Once  . nicotine  14 mg Transdermal Daily  . pantoprazole  40 mg Oral BID   Continuous Infusions: . 0.9 % NaCl with KCl 40 mEq / L 125 mL/hr (05/05/18 0149)  . piperacillin-tazobactam (ZOSYN)  IV 3.375 g (05/05/18 0544)   PRN Meds:.ALPRAZolam, ibuprofen, ipratropium-albuterol, metoprolol tartrate, morphine injection, nicotine polacrilex, ondansetron **OR** ondansetron (ZOFRAN) IV, oxyCODONE, polyethylene glycol  Mickeal Skinner, MD Pg# [phone] [facility]

## 2018-05-06 NOTE — Progress Notes (Signed)
[facility]/Trauma Progress Note      Assessment/Plan Principal Problem:   Diverticulitis large intestine w/o perforation or abscess w/o bleeding Active Problems:   Anxiety disorder   GERD (gastroesophageal reflux disease)   Tobacco abuse   Barrett's esophagus  Diverticulitis - small 1.8 wall abscess, pain improving  FEN: advance to FLD VTE: SCD's, lovenox ID: Zosyn 06/01>> Foley: none Follow up: TBD  DISPO: Pain improving, advance to FLD, continue IV Zosyn, am CBC    LOS: 2 days    Subjective: CC: abdominal pain  Pain is improved but not gone. No nausea, vomiting, fever or chills overnight. Discussed the importance of smoking cessation to improve immune system.   Objective: Vital signs in last 24 hours: Temp:  [98.1 F (36.7 C)-98.9 F (37.2 C)] 98.9 F (37.2 C) (06/03 0522) Pulse Rate:  [62-73] 62 (06/03 0522) Resp:  [16-20] 18 (06/02 2059) BP: (104-120)/(59-87) 116/72 (06/03 0522) SpO2:  [94 %-99 %] 94 % (06/03 0522) Last BM Date: 05/03/18  Intake/Output from previous day: 06/02 0701 - 06/03 0700 In: 1990 [P.O.:1140; I.V.:800; IV Piggyback:50] Out: 0  Intake/Output this shift: No intake/output data recorded.  PE: Gen:  Alert, NAD, pleasant, cooperative Pulm:  Rate effort normal Abd: Soft, ND, +BS, no HSM, mild TTP in lower abdomen without guarding, no signs of peritonitis  Skin: no rashes noted, warm and dry   Anti-infectives: Anti-infectives (From admission, onward)   Start     Dose/Rate Route Frequency Ordered Stop   05/04/18 1300  piperacillin-tazobactam (ZOSYN) IVPB 3.375 g     3.375 g 12.5 mL/hr over 240 Minutes Intravenous Every 8 hours 05/04/18 0910     05/04/18 0630  piperacillin-tazobactam (ZOSYN) IVPB 3.375 g     3.375 g 100 mL/hr over 30 Minutes Intravenous  Once 05/04/18 0621 05/04/18 0706      Lab Results:  Recent Labs    05/04/18 1021 05/05/18 0744  WBC 7.1 5.7  HGB 14.2 13.1  HCT 42.6 39.7  PLT 191 162    BMET Recent Labs    05/03/18 2159 05/04/18 1021 05/05/18 0744  NA 140  --  138  K 3.9  --  3.8  CL 107  --  107  CO2 24  --  23  GLUCOSE 94  --  81  BUN 8  --  <5*  CREATININE 0.78 0.71 0.75  CALCIUM 8.8*  --  8.2*   PT/INR No results for input(s): LABPROT, INR in the last 72 hours. CMP     Component Value Date/Time   NA 138 05/05/2018 0744   K 3.8 05/05/2018 0744   CL 107 05/05/2018 0744   CO2 23 05/05/2018 0744   GLUCOSE 81 05/05/2018 0744   BUN <5 (L) 05/05/2018 0744   CREATININE 0.75 05/05/2018 0744   CREATININE 0.79 04/10/2013 1334   CALCIUM 8.2 (L) 05/05/2018 0744   PROT 6.4 (L) 05/03/2018 2159   ALBUMIN 3.9 05/03/2018 2159   AST 14 (L) 05/03/2018 2159   ALT 14 05/03/2018 2159   ALKPHOS 61 05/03/2018 2159   BILITOT 1.2 05/03/2018 2159   GFRNONAA >60 05/05/2018 0744   GFRAA >60 05/05/2018 0744   Lipase     Component Value Date/Time   LIPASE 31 05/03/2018 2159    Studies/Results: No results found.    Kalman Drape , [facility] Surgery 05/06/2018, 8:16 AM  Pager: [phone] Mon-Wed, Friday 7:00am-4:30pm Thurs 7am-11:30am  Consults: [phone]

## 2018-05-06 NOTE — Progress Notes (Signed)
PROGRESS NOTE   Christine Shah  OBS:[id number]    DOB: 1966-06-22    DOA: 05/03/2018  PCP: Maryella Shivers, MD   I have briefly reviewed patients previous medical records in [facility].  Brief Narrative:  52 year old female with PMH of anxiety, Barrett's esophagus, remote cervical cancer, COPD, ongoing tobacco abuse, diverticulitis, GERD presented to ED with complaints of abdominal pain for couple days prior to admission and admitted for acute diverticulitis with small intramural abscess.  Treating conservatively.  General surgery consulted.  Improving.   Assessment & Plan:   Principal Problem:   Diverticulitis large intestine w/o perforation or abscess w/o bleeding Active Problems:   Anxiety disorder   GERD (gastroesophageal reflux disease)   Tobacco abuse   Barrett's esophagus   Acute sigmoid diverticulitis with small intramural abscess: General surgery consulted.  Treating conservatively with bowel rest/clear liquids, IV fluids, IV Zosyn, pain management.  Last colonoscopy in system 11/04/2011 by Dr. Earlean Shawl which showed colonic polyps.  Recommend outpatient follow-up with GI to consider screening colonoscopy after acute phase has completely resolved.  Slowly improving.  General surgery of advanced diet to full liquids.  Tobacco abuse: Cessation counseled.  Continue nicotine patch.  COPD: Stable without clinical bronchospasm.  No hypoxia noted.  GERD/Barrett's esophagus: Continue PPI.  Barrett's confirmed by EGD/Dr. Carlean Purl September 2016 and needs to follow-up for repeat EGD later this year.  Anxiety disorder: Continue Xanax.  History of thyroid nodules: Status post biopsy in December 2018 showing benign follicular lesions.  Unclear why she is on metoprolol.     DVT prophylaxis: Lovenox Code Status: Full Family Communication: None at bedside Disposition: DC home pending clinical improvement, possibly in 24-48 hours.   Consultants:  General surgery  Procedures:    None  Antimicrobials:  IV Zosyn   Subjective: Continuing to improve.  Indicates that her pain is down from 12/10 on admission to 6/12.  Ambulated many times in the halls.  Passing flatus.  No BM.  Tolerated clear liquids.  Denies any other complaints.  ROS: As above otherwise negative  Objective:  Vitals:   05/05/18 1641 05/05/18 2059 05/06/18 0522 05/06/18 0821  BP: 115/74 120/87 116/72 114/75  Pulse: 70 71 62 62  Resp: 16 18  16   Temp: 98.2 F (36.8 C) 98.3 F (36.8 C) 98.9 F (37.2 C) 98 F (36.7 C)  TempSrc: Oral Oral Oral Oral  SpO2: 99% 96% 94% 98%  Weight:      Height:        Examination:  General exam: Pleasant young female, moderately built and obese, sitting up comfortably in bed. Respiratory system: Clear to auscultation. Respiratory effort normal.  Stable Cardiovascular system: S1 & S2 heard, RRR. No JVD, murmurs, rubs, gallops or clicks. No pedal edema.  Stable Gastrointestinal system: Abdomen is nondistended, soft.  Minimal tenderness in the lower quadrants without rigidity, guarding or rebound, may be slightly less tender today. No organomegaly or masses felt. Normal bowel sounds heard. Central nervous system: Alert and oriented. No focal neurological deficits.  Stable Extremities: Symmetric 5 x 5 power. Skin: No rashes, lesions or ulcers Psychiatry: Judgement and insight appear normal. Mood & affect appropriate.     Data Reviewed: I have personally reviewed following labs and imaging studies  CBC: Recent Labs  Lab 05/01/18 2107 05/03/18 2159 05/04/18 1021 05/05/18 0744  WBC 8.0 10.5 7.1 5.7  HGB 15.2* 15.0 14.2 13.1  HCT 44.5 44.5 42.6 39.7  MCV 90.8 91.6 92.4 93.9  PLT 231  202 191 021   Basic Metabolic Panel: Recent Labs  Lab 05/01/18 2107 05/03/18 2159 05/04/18 1021 05/05/18 0744  NA 140 140  --  138  K 4.1 3.9  --  3.8  CL 106 107  --  107  CO2 25 24  --  23  GLUCOSE 106* 94  --  81  BUN 9 8  --  <5*  CREATININE 0.76 0.78 0.71  0.75  CALCIUM 9.1 8.8*  --  8.2*   Liver Function Tests: Recent Labs  Lab 05/03/18 2159  AST 14*  ALT 14  ALKPHOS 61  BILITOT 1.2  PROT 6.4*  ALBUMIN 3.9    Radiology Studies: No results found.      Scheduled Meds: . enoxaparin (LOVENOX) injection  40 mg Subcutaneous Q24H  . nicotine  14 mg Transdermal Daily  . pantoprazole  40 mg Oral BID   Continuous Infusions: . piperacillin-tazobactam (ZOSYN)  IV 3.375 g (05/06/18 1255)     LOS: 2 days     Vernell Leep, MD, FACP, [facility] Pager [phone]  If 7PM-7AM, please contact night-coverage [email or web address] Password [facility] 05/06/2018, 2:27 PM

## 2018-05-07 LAB — CBC
HCT: 36.5 % (ref 36.0–46.0)
HEMOGLOBIN: 12.1 g/dL (ref 12.0–15.0)
MCH: 30.6 pg (ref 26.0–34.0)
MCHC: 33.2 g/dL (ref 30.0–36.0)
MCV: 92.4 fL (ref 78.0–100.0)
PLATELETS: 169 10*3/uL (ref 150–400)
RBC: 3.95 MIL/uL (ref 3.87–5.11)
RDW: 12.3 % (ref 11.5–15.5)
WBC: 5.1 10*3/uL (ref 4.0–10.5)

## 2018-05-07 MED ORDER — CLOTRIMAZOLE 1 % VA CREA: 1.0000 | TOPICAL_CREAM | Freq: Every day | VAGINAL | 0 refills | Status: AC

## 2018-05-07 MED ORDER — NICOTINE 14 MG/24HR TD PT24: 14.0000 mg | MEDICATED_PATCH | Freq: Every day | TRANSDERMAL | 0 refills | Status: DC

## 2018-05-07 MED ORDER — AMOXICILLIN-POT CLAVULANATE 875-125 MG PO TABS: 1.0000 | ORAL_TABLET | Freq: Two times a day (BID) | ORAL | 0 refills | Status: DC

## 2018-05-07 MED ORDER — AMOXICILLIN-POT CLAVULANATE 875-125 MG PO TABS
1.0000 | ORAL_TABLET | Freq: Two times a day (BID) | ORAL | Status: DC
  Administered 2018-05-07: 1 via ORAL
  Filled 2018-05-07: qty 1

## 2018-05-07 NOTE — Care Management Note (Signed)
Case Management Note  Patient Details  Name: EARLYNE FEESER MRN: [id number] Date of Birth: Aug 08, 1966  Subjective/Objective:                 Patient admitted w diverticulitis. Independent from home. No CM needs identified.   Action/Plan:   Expected Discharge Date:                  Expected Discharge Plan:  Home/Self Care  In-House Referral:     Discharge planning Services  CM Consult  Post Acute Care Choice:    Choice offered to:     DME Arranged:    DME Agency:     HH Arranged:    HH Agency:     Status of Service:  Completed, signed off  If discussed at [employer] of Stay Meetings, dates discussed:    Additional Comments:  Carles Collet, RN 05/07/2018, 11:50 AM

## 2018-05-07 NOTE — Progress Notes (Signed)
Patient discharged to Home . After visit Summary reviewed. Patient capable of reverbalizing medications and follow up visits. No signs and symptoms of distress noted. Patient educated to return to the ED in the case of an emergency. Zong Mcquarrie RN 

## 2018-05-07 NOTE — Progress Notes (Signed)
[facility]/Trauma Progress Note      Assessment/Plan Principal Problem:   Diverticulitis large intestine w/o perforation or abscess w/o bleeding Active Problems:   Anxiety disorder   GERD (gastroesophageal reflux disease)   Tobacco abuse   Barrett's esophagus  Diverticulitis - small 1.8 wall abscess, pain improving  FEN: FLD VTE: SCD's, lovenox ID: Zosyn 06/01>> Foley: none Follow up: TBD  DISPO: Pain improving, okay to transition to PO antibiotic (Augmentin for 2 weeks of total antibiotics) and pt is okay for discharge from a surgical standpoint. I discussed a low fiber diet with her.     LOS: 3 days    Subjective: CC: diverticulitis  Pain greatly improved from yesterday. No nausea, vomiting, fever or chills overnight. No issues with a FLD. Pt is ready to go home. I again discussed smoking cessation. No family at bedside.   Objective: Vital signs in last 24 hours: Temp:  [97.9 F (36.6 C)-98.4 F (36.9 C)] 97.9 F (36.6 C) (06/04 0558) Pulse Rate:  [57-66] 57 (06/04 0558) Resp:  [14] 14 (06/04 0558) BP: (100-153)/(59-85) 100/59 (06/04 0558) SpO2:  [96 %-100 %] 96 % (06/04 0558) Weight:  [99.1 kg (218 lb 7.6 oz)] 99.1 kg (218 lb 7.6 oz) (06/03 2128) Last BM Date: 05/03/18  Intake/Output from previous day: 06/03 0701 - 06/04 0700 In: 50 [IV Piggyback:50] Out: 0  Intake/Output this shift: No intake/output data recorded.  PE: Gen:  Alert, NAD, pleasant, cooperative Pulm:  Rate effort normal Abd: Soft, ND, +BS, no HSM, no TTP, no guarding, no signs of peritonitis  Skin: no rashes noted, warm and dry  Anti-infectives: Anti-infectives (From admission, onward)   Start     Dose/Rate Route Frequency Ordered Stop   05/04/18 1300  piperacillin-tazobactam (ZOSYN) IVPB 3.375 g     3.375 g 12.5 mL/hr over 240 Minutes Intravenous Every 8 hours 05/04/18 0910     05/04/18 0630  piperacillin-tazobactam (ZOSYN) IVPB 3.375 g     3.375 g 100 mL/hr over  30 Minutes Intravenous  Once 05/04/18 0621 05/04/18 0706      Lab Results:  Recent Labs    05/05/18 0744 05/07/18 0332  WBC 5.7 5.1  HGB 13.1 12.1  HCT 39.7 36.5  PLT 162 169   BMET Recent Labs    05/04/18 1021 05/05/18 0744  NA  --  138  K  --  3.8  CL  --  107  CO2  --  23  GLUCOSE  --  81  BUN  --  <5*  CREATININE 0.71 0.75  CALCIUM  --  8.2*   PT/INR No results for input(s): LABPROT, INR in the last 72 hours. CMP     Component Value Date/Time   NA 138 05/05/2018 0744   K 3.8 05/05/2018 0744   CL 107 05/05/2018 0744   CO2 23 05/05/2018 0744   GLUCOSE 81 05/05/2018 0744   BUN <5 (L) 05/05/2018 0744   CREATININE 0.75 05/05/2018 0744   CREATININE 0.79 04/10/2013 1334   CALCIUM 8.2 (L) 05/05/2018 0744   PROT 6.4 (L) 05/03/2018 2159   ALBUMIN 3.9 05/03/2018 2159   AST 14 (L) 05/03/2018 2159   ALT 14 05/03/2018 2159   ALKPHOS 61 05/03/2018 2159   BILITOT 1.2 05/03/2018 2159   GFRNONAA >60 05/05/2018 0744   GFRAA >60 05/05/2018 0744   Lipase     Component Value Date/Time   LIPASE 31 05/03/2018 2159    Studies/Results: No results found.    Christine Shah  Christine Shah , [facility] 05/07/2018, 9:26 AM  Pager: [phone] Mon-Wed, Friday 7:00am-4:30pm Thurs 7am-11:30am  Consults: [phone]

## 2018-05-07 NOTE — Progress Notes (Signed)
Pharmacy Antibiotic Note  Christine Shah is a 52 y.o. female admitted on 05/03/2018 with intra-abdominal infection.  Pharmacy has been consulted for Zosyn dosing.    Plan: Continue Zosyn 3.375 gm IV q8h (4 hour infusion) Possible change to Augmentin per Surgery note for total of 2 weeks (end date 05/18/18) Monitor clinical picture, renal function    Height: 5\' 6"  (167.6 cm) Weight: 218 lb 7.6 oz (99.1 kg) IBW/kg (Calculated) : 59.3  Temp (24hrs), Avg:98.2 F (36.8 C), Min:97.9 F (36.6 C), Max:98.4 F (36.9 C)  Recent Labs  Lab 05/01/18 2107 05/03/18 2159 05/04/18 1021 05/05/18 0744 05/07/18 0332  WBC 8.0 10.5 7.1 5.7 5.1  CREATININE 0.76 0.78 0.71 0.75  --     Estimated Creatinine Clearance: 101 mL/min (by C-G formula based on SCr of 0.75 mg/dL).    Allergies  Allergen Reactions  . Ciprofloxacin Other (See Comments)    Severe headache from combination of Cipro and Buspar approx 2016 (stopped Cipro and issue was resolved)  . Codeine Other (See Comments)    Migraine.  . Tramadol Other (See Comments)    dizziness  . Hydrocodone Itching    Antimicrobials this admission: Zosyn 6/1 >>   Dose adjustments this admission: n/a  Microbiology results: N/a  [name] A. Levada Dy, PharmD, [place] Pager: [phone]   05/07/2018 10:58 AM

## 2018-05-07 NOTE — Discharge Summary (Addendum)
Physician Discharge Summary  Christine Shah NAT:[id number] DOB: [birth year]  PCP: Maryella Shivers, MD  Admit date: 05/03/2018 Discharge date: 05/07/2018  Recommendations for Outpatient Follow-up:  1. Dr. Maryella Shivers, PCP in 1 week with repeat labs (CBC & BMP). 2. Dr. Michelene Heady, [facility].  Follow-up regarding screening colonoscopy 3. Dr. Stark Klein, General Surgery in 8 weeks.  Home Health: None Equipment/Devices: None  Discharge Condition: Improved and stable CODE STATUS: Full Diet recommendation: Low fiber diet, heart healthy.  Discharge Diagnoses:  Principal Problem:   Diverticulitis large intestine w/o perforation or abscess w/o bleeding Active Problems:   Anxiety disorder   GERD (gastroesophageal reflux disease)   Tobacco abuse   Barrett's esophagus   Brief Summary: 52 year old female with PMH of anxiety, Barrett's esophagus, remote cervical cancer, COPD, ongoing tobacco abuse, diverticulitis, GERD presented to ED with complaints of abdominal pain for couple days prior to admission and admitted for acute diverticulitis with small intramural abscess.  Treated conservatively.  General surgery consulted.   Assessment & Plan:   Acute sigmoid diverticulitis with small intramural abscess: General surgery consulted.  Treated conservatively with bowel rest, IV fluids, IV Zosyn, pain management.  Last colonoscopy in system 11/04/2011 by Dr. Earlean Shawl which showed colonic polyps.  Recommend outpatient follow-up with GI to consider screening colonoscopy after acute phase has completely resolved.    Diet was gradually advanced which she has tolerated.  Abdominal pain resolved since yesterday.  Passing flatus.  No BM yet.  General surgery follow-up appreciated, advance diet and have cleared her for discharge home.  Patient has completed 4 days of IV antibiotics, transitioned to oral Augmentin to complete total 14 days course.  Also initiated topical antifungal for suspected Candida  vulvovaginitis due to antibiotics.  Tobacco abuse: Cessation counseled.  Continue nicotine patch.  COPD: Stable without clinical bronchospasm.  No hypoxia noted.  Has follow-up appointment with outpatient pulmonology.  GERD/Barrett's esophagus: Continue PPI.  Barrett's confirmed by EGD/Dr. Carlean Purl September 2016 and needs to follow-up for repeat EGD later this year.  Anxiety disorder: Continue Xanax.  History of thyroid nodules: Status post biopsy in December 2018 showing benign follicular lesions.    She is on as needed oral metoprolol for palpitations.  No recent TSH in system, last one on 07/08/2013 was normal.  May consider repeating if no recent one with PCP.    Consultants:  General surgery  Procedures:  None    Discharge Instructions  Discharge Instructions    Activity as tolerated - No restrictions   Complete by:  As directed    Call MD for:  difficulty breathing, headache or visual disturbances   Complete by:  As directed    Call MD for:  extreme fatigue   Complete by:  As directed    Call MD for:  persistant dizziness or light-headedness   Complete by:  As directed    Call MD for:  persistant nausea and vomiting   Complete by:  As directed    Call MD for:  severe uncontrolled pain   Complete by:  As directed    Call MD for:  temperature >100.4   Complete by:  As directed    Diet - low sodium heart healthy   Complete by:  As directed        Medication List    TAKE these medications   ALPRAZolam 0.25 MG tablet Commonly known as:  XANAX Take 0.125 mg by mouth at bedtime as needed for anxiety.   amoxicillin-clavulanate 875-125  MG tablet Commonly known as:  AUGMENTIN Take 1 tablet by mouth 2 (two) times daily.   clotrimazole 1 % vaginal cream Commonly known as:  GYNE-LOTRIMIN Place 1 Applicatorful vaginally at bedtime for 7 days.   metoprolol tartrate 25 MG tablet Commonly known as:  LOPRESSOR Take 0.5 tablets (12.5 mg total) by mouth 2 (two)  times daily as needed (palpitations).   nicotine 14 mg/24hr patch Commonly known as:  NICODERM CQ - dosed in mg/24 hours Place 1 patch (14 mg total) onto the skin daily. Start taking on:  05/08/2018   PRESCRIPTION MEDICATION Inhale 1 puff into the lungs 2 (two) times daily. Doctor gave an inhaler sample      Follow-up Information    Stark Klein, MD. Schedule an appointment as soon as possible for a visit in 8 weeks.   Specialty:  General Surgery Why:  Please call office to schedule an appointment to discuss elective colon resection.  Contact information: [address] [place] [address] [phone]        Maryella Shivers, MD. Schedule an appointment as soon as possible for a visit in 1 week.   Specialty:  Family Medicine Why:  To be seen with repeat labs (CBC & BMP). Contact information: [place] [address] [phone]        Schedule an appointment as soon as possible for a visit with Gatha Mayer, MD.   Specialty:  Gastroenterology Why:  Please contact office to schedule a follow up appointment regarding colonoscopy. Thank you.  Contact information: [address] [address] [phone]          Allergies  Allergen Reactions  . Ciprofloxacin Other (See Comments)    Severe headache from combination of Cipro and Buspar approx 2016 (stopped Cipro and issue was resolved)  . Codeine Other (See Comments)    Migraine.  . Tramadol Other (See Comments)    dizziness  . Hydrocodone Itching      Procedures/Studies: Dg Chest 2 View  Result Date: 05/01/2018 CLINICAL DATA:  Chronic palpitations. EXAM: CHEST - 2 VIEW COMPARISON:  Chest radiograph performed 03/14/2018, and CTA of the chest performed 03/16/2018 FINDINGS: The lungs are well-aerated. Mild peribronchial thickening is noted. There is no evidence of pleural effusion or pneumothorax. The heart is normal in size; the mediastinal contour is  within normal limits. No acute osseous abnormalities are seen. IMPRESSION: Mild peribronchial thickening noted.  Lungs otherwise clear. Electronically Signed   By: Garald Balding M.D.   On: 05/01/2018 21:39   Ct Abdomen Pelvis W Contrast  Result Date: 05/04/2018 CLINICAL DATA:  Acute onset of lower abdominal pain. EXAM: CT ABDOMEN AND PELVIS WITH CONTRAST TECHNIQUE: Multidetector CT imaging of the abdomen and pelvis was performed using the standard protocol following bolus administration of intravenous contrast. CONTRAST:  17mL OMNIPAQUE IOHEXOL 300 MG/ML  SOLN COMPARISON:  CT of the abdomen and pelvis performed 10/31/2016 FINDINGS: Lower chest: The visualized lung bases are grossly clear. The visualized portions of the mediastinum are unremarkable. Hepatobiliary: A 4 mm nonspecific hypodensity is noted at the right hepatic lobe. The liver is otherwise unremarkable in appearance. The gallbladder is within normal limits. The common bile duct is normal in caliber. Pancreas: The pancreas is within normal limits. Spleen: The spleen is unremarkable in appearance. Adrenals/Urinary Tract: The adrenal glands are unremarkable in appearance. The kidneys are within normal limits. There is no evidence of hydronephrosis. No renal or ureteral stones  are identified. No perinephric stranding is seen. Stomach/Bowel: The stomach is unremarkable in appearance. The small bowel is within normal limits. The appendix is not visualized; there is no evidence for appendicitis. Wall thickening is noted at the proximal sigmoid colon, with surrounding soft tissue inflammation. There appears to be a small 1.8 cm wall abscess. There is no definite evidence of perforation. Findings are compatible with acute diverticulitis. Vascular/Lymphatic: The abdominal aorta is unremarkable in appearance. The inferior vena cava is grossly unremarkable. No retroperitoneal lymphadenopathy is seen. No pelvic sidewall lymphadenopathy is identified.  Reproductive: The bladder is mildly distended and grossly unremarkable. The uterus is unremarkable in appearance. Trace free fluid within the pelvis likely physiologic in nature. No suspicious adnexal masses are seen. Other: No additional soft tissue abnormalities are seen. Musculoskeletal: No acute osseous abnormalities are identified. The visualized musculature is unremarkable in appearance. IMPRESSION: 1. Acute diverticulitis noted at the proximal sigmoid colon, with surrounding soft tissue inflammation. Small 1.8 cm wall abscess noted. No definite evidence of perforation. 2. 4 mm nonspecific hypodensity at the right hepatic lobe is likely benign but not well characterized given its size. Electronically Signed   By: Garald Balding M.D.   On: 05/04/2018 06:08      Subjective: States that she feels "great".  No abdominal pain since yesterday.  Tolerated soft diet without abdominal pain, nausea or vomiting.  No BM since admission but passing flatus.  Reports vaginal itching and asking for medications for same.  As per RN, no acute issues noted.  Patient anxious to go home.  Discharge Exam:  Vitals:   05/06/18 1735 05/06/18 2128 05/07/18 0558 05/07/18 0958  BP: (!) 147/85 (!) 153/84 (!) 100/59 112/78  Pulse: 62 66 (!) 57 (!) 54  Resp: 14 14 14 18   Temp: 98 F (36.7 C) 98.4 F (36.9 C) 97.9 F (36.6 C) 98.3 F (36.8 C)  TempSrc: Oral Oral Oral Oral  SpO2: 98% 100% 96% 99%  Weight:  99.1 kg (218 lb 7.6 oz)    Height:        General exam: Pleasant young female, moderately built and obese, sitting up comfortably in bed. Respiratory system: Clear to auscultation. Respiratory effort normal.  Cardiovascular system: S1 & S2 heard, RRR. No JVD, murmurs, rubs, gallops or clicks. No pedal edema.  Stable Gastrointestinal system: Abdomen is nondistended, soft and nontender. No organomegaly or masses felt. Normal bowel sounds heard. Central nervous system: Alert and oriented. No focal neurological  deficits.   Extremities: Symmetric 5 x 5 power. Skin: No rashes, lesions or ulcers Psychiatry: Judgement and insight appear normal. Mood & affect appropriate.      The results of significant diagnostics from this hospitalization (including imaging, microbiology, ancillary and laboratory) are listed below for reference.     Microbiology: No results found for this or any previous visit (from the past 240 hour(s)).   Labs: CBC: Recent Labs  Lab 05/01/18 2107 05/03/18 2159 05/04/18 1021 05/05/18 0744 05/07/18 0332  WBC 8.0 10.5 7.1 5.7 5.1  HGB 15.2* 15.0 14.2 13.1 12.1  HCT 44.5 44.5 42.6 39.7 36.5  MCV 90.8 91.6 92.4 93.9 92.4  PLT 231 202 191 162 814   Basic Metabolic Panel: Recent Labs  Lab 05/01/18 2107 05/03/18 2159 05/04/18 1021 05/05/18 0744  NA 140 140  --  138  K 4.1 3.9  --  3.8  CL 106 107  --  107  CO2 25 24  --  23  GLUCOSE 106* 94  --  81  BUN 9 8  --  <5*  CREATININE 0.76 0.78 0.71 0.75  CALCIUM 9.1 8.8*  --  8.2*   Liver Function Tests: Recent Labs  Lab 05/03/18 2159  AST 14*  ALT 14  ALKPHOS 61  BILITOT 1.2  PROT 6.4*  ALBUMIN 3.9   Urinalysis    Component Value Date/Time   COLORURINE YELLOW 05/03/2018 2152   APPEARANCEUR CLEAR 05/03/2018 2152   LABSPEC 1.011 05/03/2018 2152   PHURINE 5.0 05/03/2018 2152   GLUCOSEU NEGATIVE 05/03/2018 2152   HGBUR SMALL (A) 05/03/2018 2152   BILIRUBINUR NEGATIVE 05/03/2018 2152   BILIRUBINUR neg 01/07/2015 0914   KETONESUR 5 (A) 05/03/2018 2152   PROTEINUR NEGATIVE 05/03/2018 2152   UROBILINOGEN 1.0 09/24/2015 2200   NITRITE NEGATIVE 05/03/2018 2152   LEUKOCYTESUR NEGATIVE 05/03/2018 2152      Time coordinating discharge: 25 minutes  SIGNED:  Vernell Leep, MD, FACP, [facility]. [facility] Pager [phone]  If 7PM-7AM, please contact night-coverage [email or web address] Password [email or web address] 05/07/2018, 1:49 PM

## 2018-05-07 NOTE — Discharge Instructions (Signed)
Low-Fiber Diet °Fiber is found in fruits, vegetables, and whole grains. A low-fiber diet restricts fibrous foods that are not digested in the small intestine. A diet containing about 10-15 grams of fiber per day is considered low fiber. Low-fiber diets may be used to: °· Promote healing and rest the bowel during intestinal flare-ups. °· Prevent blockage of a partially obstructed or narrowed gastrointestinal tract. °· Reduce fecal weight and volume. °· Slow the movement of feces. ° °You may be on a low-fiber diet as a transitional diet following surgery, after an injury (trauma), or because of a short (acute) or lifelong (chronic) illness. Your health care provider will determine the length of time you need to stay on this diet. °What do I need to know about a low-fiber diet? °Always check the fiber content on the packaging's Nutrition Facts label, especially on foods from the grains list. Ask your dietitian if you have questions about specific foods that are related to your condition, especially if the food is not listed below. In general, a low-fiber food will have less than 2 g of fiber. °What foods can I eat? °Grains °All breads and crackers made with white flour. Sweet rolls, doughnuts, waffles, pancakes, French toast, bagels. Pretzels, Melba toast, zwieback. Well-cooked cereals, such as cornmeal, farina, or cream cereals. Dry cereals that do not contain whole grains, fruit, or nuts, such as refined corn, wheat, rice, and oat cereals. Potatoes prepared any way without skins, plain pastas and noodles, refined white rice. Use white flour for baking and making sauces. Use allowed list of grains for casseroles, dumplings, and puddings. °Vegetables °Strained tomato and vegetable juices. Fresh lettuce, cucumber, spinach. Well-cooked (no skin or pulp) or canned vegetables, such as asparagus, bean sprouts, beets, carrots, green beans, mushrooms, potatoes, pumpkin, spinach, yellow squash, tomato sauce/puree, turnips,  yams, and zucchini. Keep servings limited to ½ cup. °Fruits °All fruit juices except prune juice. Cooked or canned fruits without skin and seeds, such as applesauce, apricots, cherries, fruit cocktail, grapefruit, grapes, mandarin oranges, melons, peaches, pears, pineapple, and plums. Fresh fruits without skin, such as apricots, avocados, bananas, melons, pineapple, nectarines, and peaches. Keep servings limited to ½ cup or 1 piece. °Meat and Other Protein Sources °Ground or well-cooked tender beef, ham, veal, lamb, pork, or poultry. Eggs, plain cheese. Fish, oysters, shrimp, lobster, and other seafood. Liver, organ meats. Smooth nut butters. °Dairy °All milk products and alternative dairy substitutes, such as soy, rice, almond, and coconut, not containing added whole nuts, seeds, or added fruit. °Beverages °Decaf coffee, fruit, and vegetable juices or smoothies (small amounts, with no pulp or skins, and with fruits from allowed list), sports drinks, herbal tea. °Condiments °Ketchup, mustard, vinegar, cream sauce, cheese sauce, cocoa powder. Spices in moderation, such as allspice, basil, bay leaves, celery powder or leaves, cinnamon, cumin powder, curry powder, ginger, mace, marjoram, onion or garlic powder, oregano, paprika, parsley flakes, ground pepper, rosemary, sage, savory, tarragon, thyme, and turmeric. °Sweets and Desserts °Plain cakes and cookies, pie made with allowed fruit, pudding, custard, cream pie. Gelatin, fruit, ice, sherbet, frozen ice pops. Ice cream, ice milk without nuts. Plain hard candy, honey, jelly, molasses, syrup, sugar, chocolate syrup, gumdrops, marshmallows. Limit overall sugar intake. °Fats and Oil °Margarine, butter, cream, mayonnaise, salad oils, plain salad dressings made from allowed foods. Choose healthy fats such as olive oil, canola oil, and omega-3 fatty acids (such as found in salmon or tuna) when possible. °Other °Bouillon, broth, or cream soups made from allowed foods. Any    strained soup. Casseroles or mixed dishes made with allowed foods. The items listed above may not be a complete list of recommended foods or beverages. Contact your dietitian for more options. What foods are not recommended? Grains All whole wheat and whole grain breads and crackers. Multigrains, rye, bran seeds, nuts, or coconut. Cereals containing whole grains, multigrains, bran, coconut, nuts, raisins. Cooked or dry oatmeal, steel-cut oats. Coarse wheat cereals, granola. Cereals advertised as high fiber. Potato skins. Whole grain pasta, wild or brown rice. Popcorn. Coconut flour. Bran, buckwheat, corn bread, multigrains, rye, wheat germ. Vegetables Fresh, cooked or canned vegetables, such as artichokes, asparagus, beet greens, broccoli, Brussels sprouts, cabbage, celery, cauliflower, corn, eggplant, kale, legumes or beans, okra, peas, and tomatoes. Avoid large servings of any vegetables, especially raw vegetables. Fruits Fresh fruits, such as apples with or without skin, berries, cherries, figs, grapes, grapefruit, guavas, kiwis, mangoes, oranges, papayas, pears, persimmons, pineapple, and pomegranate. Prune juice and juices with pulp, stewed or dried prunes. Dried fruits, dates, raisins. Fruit seeds or skins. Avoid large servings of all fresh fruits. Meats and Other Protein Sources Tough, fibrous meats with gristle. Chunky nut butter. Cheese made with seeds, nuts, or other foods not recommended. Nuts, seeds, legumes (beans, including baked beans), dried peas, beans, lentils. Dairy Yogurt or cheese that contains nuts, seeds, or added fruit. Beverages Fruit juices with high pulp, prune juice. Caffeinated coffee and teas. Condiments Coconut, maple syrup, pickles, olives. Sweets and Desserts Desserts, cookies, or candies that contain nuts or coconut, chunky peanut butter, dried fruits. Jams, preserves with seeds, marmalade. Large amounts of sugar and sweets. Any other dessert made with fruits from  the not recommended list. Other Soups made from vegetables that are not recommended or that contain other foods not recommended. The items listed above may not be a complete list of foods and beverages to avoid. Contact your dietitian for more information. This information is not intended to replace advice given to you by your health care provider. Make sure you discuss any questions you have with your health care provider. Document Released: 05/12/2002 Document Revised: 04/27/2016 Document Reviewed: 10/13/2013 Elsevier Interactive Patient Education  2017 [employer].    Additional discharge instructions:  Please get your medications reviewed and adjusted by your Primary MD.  Please request your Primary MD to go over all Hospital Tests and Procedure/Radiological results at the follow up, please get all Hospital records sent to your Prim MD by signing hospital release before you go home.  If you had Pneumonia of Lung problems at the Hospital: Please get a 2 view Chest X ray done in 6-8 weeks after hospital discharge or sooner if instructed by your Primary MD.  If you have Congestive Heart Failure: Please call your Cardiologist or Primary MD anytime you have any of the following symptoms:  1) 3 pound weight gain in 24 hours or 5 pounds in 1 week  2) shortness of breath, with or without a dry hacking cough  3) swelling in the hands, feet or stomach  4) if you have to sleep on extra pillows at night in order to breathe  Follow cardiac low salt diet and 1.5 lit/day fluid restriction.  If you have diabetes Accuchecks 4 times/day, Once in AM empty stomach and then before each meal. Log in all results and show them to your primary doctor at your next visit. If any glucose reading is under 80 or above 300 call your primary MD immediately.  If you have Seizure/Convulsions/Epilepsy: Please do  not drive, operate heavy machinery, participate in activities at heights or participate in high  speed sports until you have seen by Primary MD or a Neurologist and advised to do so again.  If you had Gastrointestinal Bleeding: Please ask your Primary MD to check a complete blood count within one week of discharge or at your next visit. Your endoscopic/colonoscopic biopsies that are pending at the time of discharge, will also need to followed by your Primary MD.  Get Medicines reviewed and adjusted. Please take all your medications with you for your next visit with your Primary MD  Please request your Primary MD to go over all hospital tests and procedure/radiological results at the follow up, please ask your Primary MD to get all Hospital records sent to his/her office.  If you experience worsening of your admission symptoms, develop shortness of breath, life threatening emergency, suicidal or homicidal thoughts you must seek medical attention immediately by calling 911 or calling your MD immediately  if symptoms less severe.  You must read complete instructions/literature along with all the possible adverse reactions/side effects for all the Medicines you take and that have been prescribed to you. Take any new Medicines after you have completely understood and accpet all the possible adverse reactions/side effects.   Do not drive or operate heavy machinery when taking Pain medications.   Do not take more than prescribed Pain, Sleep and Anxiety Medications  Special Instructions: If you have smoked or chewed Tobacco  in the last 2 yrs please stop smoking, stop any regular Alcohol  and or any Recreational drug use.  Wear Seat belts while driving.  Please note You were cared for by a hospitalist during your hospital stay. If you have any questions about your discharge medications or the care you received while you were in the hospital after you are discharged, you can call the unit and asked to speak with the hospitalist on call if the hospitalist that took care of you is not available.  Once you are discharged, your primary care physician will handle any further medical issues. Please note that NO REFILLS for any discharge medications will be authorized once you are discharged, as it is imperative that you return to your primary care physician (or establish a relationship with a primary care physician if you do not have one) for your aftercare needs so that they can reassess your need for medications and monitor your lab values.  You can reach the hospitalist office at phone [phone] or fax [phone]   If you do not have a primary care physician, you can call [phone] for a physician referral.   Diverticulitis Diverticulitis is infection or inflammation of small pouches (diverticula) in the colon that form due to a condition called diverticulosis. Diverticula can trap stool (feces) and bacteria, causing infection and inflammation. Diverticulitis may cause severe stomach pain and diarrhea. It may lead to tissue damage in the colon that causes bleeding. The diverticula may also burst (rupture) and cause infected stool to enter other areas of the abdomen. Complications of diverticulitis can include:  Bleeding.  Severe infection.  Severe pain.  Rupture (perforation) of the colon.  Blockage (obstruction) of the colon.  What are the causes? This condition is caused by stool becoming trapped in the diverticula, which allows bacteria to grow in the diverticula. This leads to inflammation and infection. What increases the risk? You are more likely to develop this condition if:  You have diverticulosis. The risk for diverticulosis increases if: ?  You are overweight or obese. ? You use tobacco products. ? You do not get enough exercise.  You eat a diet that does not include enough fiber. High-fiber foods include fruits, vegetables, beans, nuts, and whole grains.  What are the signs or symptoms? Symptoms of this condition may include:  Pain and tenderness in the  abdomen. The pain is normally located on the left side of the abdomen, but it may occur in other areas.  Fever and chills.  Bloating.  Cramping.  Nausea.  Vomiting.  Changes in bowel routines.  Blood in your stool.  How is this diagnosed? This condition is diagnosed based on:  Your medical history.  A physical exam.  Tests to make sure there is nothing else causing your condition. These tests may include: ? Blood tests. ? Urine tests. ? Imaging tests of the abdomen, including X-rays, ultrasounds, MRIs, or CT scans.  How is this treated? Most cases of this condition are mild and can be treated at home. Treatment may include:  Taking over-the-counter pain medicines.  Following a clear liquid diet.  Taking antibiotic medicines by mouth.  Rest.  More severe cases may need to be treated at a hospital. Treatment may include:  Not eating or drinking.  Taking prescription pain medicine.  Receiving antibiotic medicines through an IV tube.  Receiving fluids and nutrition through an IV tube.  Surgery.  When your condition is under control, your health care provider may recommend that you have a colonoscopy. This is an exam to look at the entire large intestine. During the exam, a lubricated, bendable tube is inserted into the anus and then passed into the rectum, colon, and other parts of the large intestine. A colonoscopy can show how severe your diverticula are and whether something else may be causing your symptoms. Follow these instructions at home: Medicines  Take over-the-counter and prescription medicines only as told by your health care provider. These include fiber supplements, probiotics, and stool softeners.  If you were prescribed an antibiotic medicine, take it as told by your health care provider. Do not stop taking the antibiotic even if you start to feel better.  Do not drive or use heavy machinery while taking prescription pain medicine. General  instructions  Follow a full liquid diet or another diet as directed by your health care provider. After your symptoms improve, your health care provider may tell you to change your diet. He or she may recommend that you eat a diet that contains at least 25 g (25 grams) of fiber daily. Fiber makes it easier to pass stool. Healthy sources of fiber include: ? Berries. One cup contains 4-8 grams of fiber. ? Beans or lentils. One half cup contains 5-8 grams of fiber. ? Green vegetables. One cup contains 4 grams of fiber.  Exercise for at least 30 minutes, 3 times each week. You should exercise hard enough to raise your heart rate and break a sweat.  Keep all follow-up visits as told by your health care provider. This is important. You may need a colonoscopy. Contact a health care provider if:  Your pain does not improve.  You have a hard time drinking or eating food.  Your bowel movements do not return to normal. Get help right away if:  Your pain gets worse.  Your symptoms do not get better with treatment.  Your symptoms suddenly get worse.  You have a fever.  You vomit more than one time.  You have stools that are bloody,  black, or tarry. Summary  Diverticulitis is infection or inflammation of small pouches (diverticula) in the colon that form due to a condition called diverticulosis. Diverticula can trap stool (feces) and bacteria, causing infection and inflammation.  You are at higher risk for this condition if you have diverticulosis and you eat a diet that does not include enough fiber.  Most cases of this condition are mild and can be treated at home. More severe cases may need to be treated at a hospital.  When your condition is under control, your health care provider may recommend that you have an exam called a colonoscopy. This exam can show how severe your diverticula are and whether something else may be causing your symptoms. This information is not intended to replace  advice given to you by your health care provider. Make sure you discuss any questions you have with your health care provider. Document Released: 08/30/2005 Document Revised: 12/23/2016 Document Reviewed: 12/23/2016 Elsevier Interactive Patient Education  [employer].

## 2018-05-17 ENCOUNTER — Telehealth: Payer: Self-pay | Admitting: Internal Medicine

## 2018-05-17 NOTE — Telephone Encounter (Signed)
Patient is c/o rectal bleeding with a hard stool.  She states she saw the blood on the tissue when she wiped and a small amount in the bowl.  She is advised to start Miralax 1-3 times a day and keep the appt with Tye Savoy RNP 05/29/18.  She understands to go to ED if she has any new symptoms or is passing blood independent of stool.

## 2018-05-17 NOTE — Telephone Encounter (Signed)
Pt stooped by at the front desk stating that she is having rectal bleeding every time she has a bm. Pls call her.

## 2018-05-29 ENCOUNTER — Ambulatory Visit: Payer: Medicaid Other | Admitting: Nurse Practitioner

## 2018-05-29 ENCOUNTER — Encounter: Payer: Self-pay | Admitting: Nurse Practitioner

## 2018-05-29 VITALS — BP 134/86 | HR 76 | Ht 66.0 in | Wt 211.0 lb

## 2018-05-29 DIAGNOSIS — K5792 Diverticulitis of intestine, part unspecified, without perforation or abscess without bleeding: Secondary | ICD-10-CM | POA: Diagnosis not present

## 2018-05-29 DIAGNOSIS — K5900 Constipation, unspecified: Secondary | ICD-10-CM

## 2018-05-29 DIAGNOSIS — K2270 Barrett's esophagus without dysplasia: Secondary | ICD-10-CM | POA: Diagnosis not present

## 2018-05-29 MED ORDER — METRONIDAZOLE 500 MG PO TABS: 500.0000 mg | ORAL_TABLET | Freq: Three times a day (TID) | ORAL | 0 refills | Status: DC

## 2018-05-29 MED ORDER — CIPROFLOXACIN HCL 500 MG PO TABS: 500.0000 mg | ORAL_TABLET | Freq: Two times a day (BID) | ORAL | 0 refills | Status: DC

## 2018-05-29 NOTE — Patient Instructions (Addendum)
If you are age 50 or older, your body mass index should be between 23-30. Your Body mass index is 34.06 kg/m. If this is out of the aforementioned range listed, please consider follow up with your Primary Care Provider.  If you are age 75 or younger, your body mass index should be between 19-25. Your Body mass index is 34.06 kg/m. If this is out of the aformentioned range listed, please consider follow up with your Primary Care Provider.   We have sent the following medications to your pharmacy for you to pick up at your convenience: Cipro Flagyl  Start Miralax 1-2 times daily until bowel movment, then 1 capful daily.  Follow up appointment with me on June 19, 2018 at 10:30 am.   Thank you for choosing me and [facility].   Tye Savoy, NP
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
## 2018-05-30 ENCOUNTER — Encounter: Payer: Self-pay | Admitting: Nurse Practitioner

## 2018-05-31 ENCOUNTER — Ambulatory Visit: Payer: Medicaid Other | Admitting: Internal Medicine
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
## 2018-06-04 ENCOUNTER — Ambulatory Visit (INDEPENDENT_AMBULATORY_CARE_PROVIDER_SITE_OTHER): Payer: Medicaid Other | Admitting: Pulmonary Disease

## 2018-06-04 ENCOUNTER — Encounter: Payer: Self-pay | Admitting: Pulmonary Disease

## 2018-06-04 ENCOUNTER — Ambulatory Visit (INDEPENDENT_AMBULATORY_CARE_PROVIDER_SITE_OTHER): Payer: Medicaid Other | Admitting: Internal Medicine

## 2018-06-04 VITALS — BP 140/84 | HR 105 | Ht 66.0 in | Wt 208.0 lb

## 2018-06-04 DIAGNOSIS — Z87891 Personal history of nicotine dependence: Secondary | ICD-10-CM

## 2018-06-04 DIAGNOSIS — R0600 Dyspnea, unspecified: Secondary | ICD-10-CM | POA: Diagnosis not present

## 2018-06-04 DIAGNOSIS — R0609 Other forms of dyspnea: Secondary | ICD-10-CM | POA: Diagnosis not present

## 2018-06-04 DIAGNOSIS — K5732 Diverticulitis of large intestine without perforation or abscess without bleeding: Secondary | ICD-10-CM

## 2018-06-04 DIAGNOSIS — R002 Palpitations: Secondary | ICD-10-CM

## 2018-06-04 DIAGNOSIS — F17200 Nicotine dependence, unspecified, uncomplicated: Secondary | ICD-10-CM

## 2018-06-04 HISTORY — DX: Dyspnea, unspecified: R06.00

## 2018-06-04 LAB — PULMONARY FUNCTION TEST
DL/VA % PRED: 67 %
DL/VA: 3.4 ml/min/mmHg/L
DLCO unc % pred: 62 %
DLCO unc: 16.73 ml/min/mmHg
FEF 25-75 Pre: 1.21 L/sec
FEF2575-%PRED-PRE: 41 %
FEV1-%PRED-PRE: 53 %
FEV1-PRE: 1.61 L
FEV1FVC-%PRED-PRE: 65 %
FEV6-%Pred-Pre: 83 %
FEV6-Pre: 3.09 L
FEV6FVC-%Pred-Pre: 102 %
FVC-%Pred-Pre: 81 %
FVC-PRE: 3.09 L
Pre FEV1/FVC ratio: 52 %
Pre FEV6/FVC Ratio: 100 %

## 2018-06-04 MED ORDER — TIOTROPIUM BROMIDE-OLODATEROL 2.5-2.5 MCG/ACT IN AERS
2.0000 | INHALATION_SPRAY | Freq: Every day | RESPIRATORY_TRACT | 0 refills | Status: DC
Start: 2018-06-04 — End: 2018-06-19

## 2018-06-04 NOTE — Assessment & Plan Note (Signed)
Continue follow-up with GI Continue Flagyl as well as Cipro

## 2018-06-04 NOTE — Assessment & Plan Note (Signed)
We recommend that you stop smoking.  1 [place]  >>> Patient to call this resource and utilize it to help support her quit smoking >>> Keep up your hard work with stopping smoking   You can also contact the [facility] >>>For smoking cessation classes call [phone]  We do not recommend using e-cigarettes as a form of stopping smoking

## 2018-06-04 NOTE — Patient Instructions (Addendum)
Stiolto Respimat sample provided in office  >>> 2 puffs inhaled daily >>>Do this no matter what >>>This is not a rescue inhaler if you do this is daily not as needed  Keep follow-up with cardiology next week  Monitor your sleeping patterns, if sleep patterns worsen, insomnia occurs, or you have concerns that you potentially or stopping breathing when you are sleeping please follow-up with our office.  Follow-up with Dr. Chase Caller in 6 to 8 weeks.  If you have any changes in your respiratory status, concerns with any of your medications please follow-up with our office.  We recommend that you stop smoking. 1 [place]  >>> Patient to call this resource and utilize it to help support her quit smoking >>> Keep up your hard work with stopping smoking  You can also contact the [facility] >>>For smoking cessation classes call [phone]  We do not recommend using e-cigarettes as a form of stopping smoking      Please contact the office if your symptoms worsen or you have concerns that you are not improving.   Thank you for choosing [facility] for your healthcare, and for allowing [place] to partner with you on your healthcare journey. I am thankful to be able to provide care to you today.   [name]

## 2018-06-04 NOTE — Assessment & Plan Note (Signed)
PFT showing moderate obstruction with diminished diffusion capacity Unable to assess bronchodilator response Stiolto Respimat sample provided today Patient to call when she is halfway through the samples to notify us how she is doing Emphasized the importance of stopping smoking Follow-up with Dr. Chase Caller in 8 weeks

## 2018-06-04 NOTE — Progress Notes (Signed)
PFT completed today.  
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
## 2018-06-14 ENCOUNTER — Ambulatory Visit: Payer: Medicaid Other | Admitting: Cardiology

## 2018-06-14 ENCOUNTER — Encounter

## 2018-06-14 ENCOUNTER — Encounter: Payer: Self-pay | Admitting: Cardiology

## 2018-06-14 VITALS — BP 128/80 | HR 68 | Ht 66.0 in | Wt 207.8 lb

## 2018-06-14 DIAGNOSIS — R002 Palpitations: Secondary | ICD-10-CM | POA: Diagnosis not present

## 2018-06-14 DIAGNOSIS — Z720 Tobacco use: Secondary | ICD-10-CM | POA: Diagnosis not present

## 2018-06-14 DIAGNOSIS — R079 Chest pain, unspecified: Secondary | ICD-10-CM

## 2018-06-14 DIAGNOSIS — K5792 Diverticulitis of intestine, part unspecified, without perforation or abscess without bleeding: Secondary | ICD-10-CM

## 2018-06-14 DIAGNOSIS — Z8249 Family history of ischemic heart disease and other diseases of the circulatory system: Secondary | ICD-10-CM

## 2018-06-14 DIAGNOSIS — J410 Simple chronic bronchitis: Secondary | ICD-10-CM

## 2018-06-14 NOTE — Patient Instructions (Addendum)
Medication Instructions:  Your provider recommends that you continue on your current medications as directed. Please refer to the Current Medication list given to you today.    Labwork: None  Testing/Procedures: Your provider recommends you have a CALCIUM SCORE.  Your provider has requested that you have an exercise tolerance test. For further information please visit [email or web address]. Please also follow instruction sheet, as given.   Your physician has recommended that you wear an event monitor. Event monitors are medical devices that record the heart's electrical activity. Doctors most often [place] these monitors to diagnose arrhythmias. Arrhythmias are problems with the speed or rhythm of the heartbeat. The monitor is a small, portable device. You can wear one while you do your normal daily activities. This is usually used to diagnose what is causing palpitations/syncope (passing out).  Follow-Up: Your provider recommends that you schedule a follow-up appointment with Dr. Radford Pax 5 weeks after event monitor is placed.  Any Other Special Instructions Will Be Listed Below (If Applicable).     If you need a refill on your cardiac medications before your next appointment, please call your pharmacy.

## 2018-06-19 ENCOUNTER — Ambulatory Visit: Payer: Medicaid Other | Admitting: Nurse Practitioner

## 2018-06-19 ENCOUNTER — Encounter: Payer: Self-pay | Admitting: Nurse Practitioner

## 2018-06-19 VITALS — BP 126/72 | HR 78 | Ht 66.0 in | Wt 206.0 lb

## 2018-06-19 DIAGNOSIS — K5900 Constipation, unspecified: Secondary | ICD-10-CM

## 2018-06-19 DIAGNOSIS — K5720 Diverticulitis of large intestine with perforation and abscess without bleeding: Secondary | ICD-10-CM

## 2018-06-19 DIAGNOSIS — K2270 Barrett's esophagus without dysplasia: Secondary | ICD-10-CM
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
## 2018-06-19 NOTE — Patient Instructions (Signed)
If you are age 50 or older, your body mass index should be between 23-30. Your Body mass index is 33.25 kg/m. If this is out of the aforementioned range listed, please consider follow up with your Primary Care Provider.  If you are age 21 or younger, your body mass index should be between 19-25. Your Body mass index is 33.25 kg/m. If this is out of the aformentioned range listed, please consider follow up with your Primary Care Provider.   You have been scheduled for an endoscopy and colonoscopy. Please follow the written instructions given to you at your visit today. Please pick up your prep supplies at the pharmacy within the next 1-3 days. If you use inhalers (even only as needed), please bring them with you on the day of your procedure. Your physician has requested that you go to www.startemmi.com and enter the access code given to you at your visit today. This web site gives a general overview about your procedure. However, you should still follow specific instructions given to you by our office regarding your preparation for the procedure.  Thank you for choosing me and [facility].   Tye Savoy, NP

## 2018-06-20 ENCOUNTER — Other Ambulatory Visit: Payer: Medicaid Other

## 2018-06-20 ENCOUNTER — Ambulatory Visit (INDEPENDENT_AMBULATORY_CARE_PROVIDER_SITE_OTHER): Payer: Medicaid Other

## 2018-06-20 DIAGNOSIS — R002 Palpitations: Secondary | ICD-10-CM

## 2018-06-20 DIAGNOSIS — R079 Chest pain, unspecified: Secondary | ICD-10-CM | POA: Diagnosis not present

## 2018-06-20 LAB — EXERCISE TOLERANCE TEST
CHL CUP MPHR: 171 {beats}/min
CHL CUP RESTING HR STRESS: 87 {beats}/min
CSEPPHR: 150 {beats}/min
Estimated workload: 5.6 METS
Exercise duration (min): 3 min
Exercise duration (sec): 52 s
Percent HR: 87 %
RPE: 16

## 2018-06-21 ENCOUNTER — Encounter: Payer: Self-pay | Admitting: Nurse Practitioner

## 2018-06-22 ENCOUNTER — Ambulatory Visit (HOSPITAL_COMMUNITY)
Admission: EM | Admit: 2018-06-22 | Discharge: 2018-06-22 | Disposition: A | Discharge status: Home / Self Care | Payer: Medicaid Other | Attending: Family Medicine | Admitting: Family Medicine

## 2018-06-22 ENCOUNTER — Other Ambulatory Visit: Payer: Self-pay

## 2018-06-22 ENCOUNTER — Encounter (HOSPITAL_COMMUNITY): Payer: Self-pay

## 2018-06-22 ENCOUNTER — Telehealth (HOSPITAL_COMMUNITY): Payer: Self-pay | Admitting: *Deleted

## 2018-06-22 ENCOUNTER — Other Ambulatory Visit: Payer: Self-pay | Admitting: Family Medicine

## 2018-06-22 DIAGNOSIS — J441 Chronic obstructive pulmonary disease with (acute) exacerbation: Secondary | ICD-10-CM | POA: Diagnosis not present

## 2018-06-22 MED ORDER — BECLOMETHASONE DIPROPIONATE 40 MCG/ACT IN AERS: 2.0000 | INHALATION_SPRAY | Freq: Two times a day (BID) | RESPIRATORY_TRACT | 0 refills | Status: DC

## 2018-06-22 NOTE — Telephone Encounter (Signed)
Pt unable to afford Qvar Rx.  Pharmacist recommending alternative such as Flovent.  Discussed with Dr Gwendlyn Deutscher; V.O may give Flovent 82mcg HFA, 1-2 puffs BID.  Pharmacist verbalized understanding.
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
## 2018-06-22 NOTE — ED Notes (Signed)
Pt discharged by provider.

## 2018-06-22 NOTE — ED Triage Notes (Signed)
Pt presents to [facility] for productive cough x3 days, pt also complains of congestion, pt has COPD

## 2018-06-22 NOTE — Discharge Instructions (Addendum)
Continue to push fluids, practice good hand hygiene, and cover your mouth if you cough.  If you start having fevers, shaking or worsening shortness of breath, seek immediate care.  Rinse your mouth out after each use.  Continue albuterol as needed

## 2018-06-24 ENCOUNTER — Telehealth: Payer: Self-pay

## 2018-06-24 DIAGNOSIS — R002 Palpitations: Secondary | ICD-10-CM

## 2018-06-24 NOTE — Telephone Encounter (Signed)
-----   Message from Isaiah Serge, NP sent at 06/24/2018  2:36 PM EDT ----- Stress test without ischemia, echo will need to be done per Dr. Radford Pax then we can clear for surgery if normal.

## 2018-06-24 NOTE — Telephone Encounter (Signed)
Called patient about her stress test results. Per Cecilie Kicks NP, Stress test without ischemia, echo will need to be done per Dr. Radford Pax then we can clear for surgery if normal. Patient verbalized understanding. Order in for echo, will have someone call patient to schedule.

## 2018-07-01 ENCOUNTER — Other Ambulatory Visit: Payer: Self-pay

## 2018-07-01 ENCOUNTER — Ambulatory Visit (HOSPITAL_COMMUNITY): Payer: Medicaid Other | Attending: Internal Medicine

## 2018-07-01 ENCOUNTER — Ambulatory Visit: Payer: Self-pay | Admitting: Surgery

## 2018-07-01 DIAGNOSIS — F17200 Nicotine dependence, unspecified, uncomplicated: Secondary | ICD-10-CM | POA: Insufficient documentation

## 2018-07-01 DIAGNOSIS — J449 Chronic obstructive pulmonary disease, unspecified: Secondary | ICD-10-CM | POA: Insufficient documentation

## 2018-07-01 DIAGNOSIS — R0600 Dyspnea, unspecified: Secondary | ICD-10-CM | POA: Diagnosis not present

## 2018-07-01 DIAGNOSIS — R002 Palpitations: Secondary | ICD-10-CM | POA: Diagnosis present

## 2018-07-01 NOTE — Progress Notes (Signed)
Pt has been made aware of normal result and verbalized understanding.  jw 07/01/18

## 2018-07-01 NOTE — H&P (Signed)
Christine Shah Documented: 07/01/2018 10:20 AM Location: [facility] Patient #: [id number] DOB: 07/08/1966 Single / Language: Christine Shah / Race: White Female  History of Present Illness Christine Hector MD; 07/01/2018 11:30 AM) The patient is a 52 year old female who presents with diverticulitis. Note for "Diverticulitis": ` ` ` Patient sent for surgical consultation at the request of DR [facility]  Chief Complaint: Recurrent diverticulitis ` ` The patient is a pleasant woman with cardiac and pulmonary issues. Smoker. Has had numerous attacks of sigmoid diverticulitis. She recalls at least 6 prior to this year. Had an episode of pain when he was admitted with a small abscess in her sigmoid colon. Improved with antibiotics. Surgical consultation requested. Recommended outpatient follow-up with gastroenterology and surgery to consider resection since she's had repeated attacks, the last one complicated. Since that time, she has needed a couple more rounds of Cipro/Flagyl. Just finished a course a couple weeks ago. She noted that when she took it with her BuSpar and backfired, but she can take it fine by itself. She usually moves her bowels about twice a week. Was recommended she stay on MiraLAX. That is help her bowels move every day. [facility]. They have seen her since discharge and they are planning elective colonoscopy to rule out any other potential source of her colon perforation. Apparently colonoscopy is planned in late September. She is worried about waiting that long. She is evaluated by pulmonary. 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 They're trying to get her to quit. She's been evaluated by cardiology as well. At some episodes of chest pain. Felt perhaps was more like costochondritis. Had a stress test done which showed no ischemia. Echocardiogram Phillip Heal do later today. Most likely will clear if that is normal as well. Also encouraged her to  quit smoking.  Patient claimed she had an umbilical hernia repair by Dr. York Spaniel with our group over 10 years ago. Tubal ligation before that. No other abdominal surgery. Colonoscopy by Dr. Earlean Shawl in 2011 showed a few hyperplastic polyps. Otherwise underwhelming. No personal nor family history of GI/colon cancer, inflammatory bowel disease, irritable bowel syndrome, allergy such as Celiac Sprue, dietary/dairy problems, colitis, ulcers nor gastritis. No recent sick contacts/gastroenteritis. No travel outside the country. No changes in diet. No dysphagia to solids or liquids. No significant heartburn or reflux. No hematochezia, hematemesis, coffee ground emesis. No evidence of prior gastric/peptic ulceration.  (Review of systems as stated in this history (HPI) or in the review of systems. Otherwise all other 12 point ROS are negative) ` ` `   Allergies Sabino Gasser; 07/01/2018 10:20 AM) Codeine Phosphate *ANALGESICS - OPIOID* Hydrocodone-Acetaminophen *ANALGESICS - OPIOID* TraMADol HCl *ANALGESICS - OPIOID* Allergies Reconciled  Medication History Sabino Gasser; 07/01/2018 10:21 AM) Xanax (0.5MG  Tablet, Oral) Active. Medications Reconciled    Vitals Sabino Gasser; 07/01/2018 10:22 AM) 07/01/2018 10:21 AM Weight: 209 lb Height: 67in Body Surface Area: 2.06 m Body Mass Index: 32.73 kg/m  Temp.: 44F(Oral)  Pulse: 97 (Regular)  BP: 126/80 (Sitting, Left Arm, Standard)      Physical Exam Christine Hector MD; 07/01/2018 10:55 AM)  General Mental Status-Alert. General Appearance-Not in acute distress, Not Sickly. Orientation-Oriented X3. Hydration-Well hydrated. Voice-Normal.  Integumentary Global Assessment Upon inspection and palpation of skin surfaces of the - Axillae: non-tender, no inflammation or ulceration, no drainage. and Distribution of scalp and body hair is normal. General Characteristics Temperature - normal warmth is  noted.  Head and Neck Head-normocephalic, atraumatic with no lesions or palpable  masses. Face Global Assessment - atraumatic, no absence of expression. Neck Global Assessment - no abnormal movements, no bruit auscultated on the right, no bruit auscultated on the left, no decreased range of motion, non-tender. Trachea-midline. Thyroid Gland Characteristics - non-tender.  Eye Eyeball - Left-Extraocular movements intact, No Nystagmus. Eyeball - Right-Extraocular movements intact, No Nystagmus. Cornea - Left-No Hazy. Cornea - Right-No Hazy. Sclera/Conjunctiva - Left-No scleral icterus, No Discharge. Sclera/Conjunctiva - Right-No scleral icterus, No Discharge. Pupil - Left-Direct reaction to light normal. Pupil - Right-Direct reaction to light normal.  ENMT Ears Pinna - Left - no drainage observed, no generalized tenderness observed. Right - no drainage observed, no generalized tenderness observed. Nose and Sinuses External Inspection of the Nose - no destructive lesion observed. Inspection of the nares - Left - quiet respiration. Right - quiet respiration. Mouth and Throat Lips - Upper Lip - no fissures observed, no pallor noted. Lower Lip - no fissures observed, no pallor noted. Nasopharynx - no discharge present. Oral Cavity/Oropharynx - Tongue - no dryness observed. Oral Mucosa - no cyanosis observed. Hypopharynx - no evidence of airway distress observed.  Chest and Lung Exam Inspection Movements - Normal and Symmetrical. Accessory muscles - No use of accessory muscles in breathing. Palpation Palpation of the chest reveals - Non-tender. Auscultation Breath sounds - Normal and Clear.  Cardiovascular Auscultation Rhythm - Regular. Murmurs & Other Heart Sounds - Auscultation of the heart reveals - No Murmurs and No Systolic Clicks.  Abdomen Inspection Inspection of the abdomen reveals - No Visible peristalsis and No Abnormal pulsations. Umbilicus - No  Bleeding, No Urine drainage. Palpation/Percussion Palpation and Percussion of the abdomen reveal - Soft, Non Tender, No Rebound tenderness, No Rigidity (guarding) and No Cutaneous hyperesthesia. Note: Abdomen soft. Overweight. Not distended. No distasis recti. No umbilical or other anterior abdominal wall hernias. Mild epigastric discomfort but no lower abdominal pain. No suprapubic pain.  Female Genitourinary Sexual Maturity Tanner 5 - Adult hair pattern. Note: No vaginal bleeding nor discharge  Peripheral Vascular Upper Extremity Inspection - Left - No Cyanotic nailbeds, Not Ischemic. Right - No Cyanotic nailbeds, Not Ischemic.  Neurologic Neurologic evaluation reveals -normal attention span and ability to concentrate, able to name objects and repeat phrases. Appropriate fund of knowledge , normal sensation and normal coordination. Mental Status Affect - not angry, not paranoid. Cranial Nerves-Normal Bilaterally. Gait-Normal.  Neuropsychiatric Mental status exam performed with findings of-able to articulate well with normal speech/language, rate, volume and coherence, thought content normal with ability to perform basic computations and apply abstract reasoning and no evidence of hallucinations, delusions, obsessions or homicidal/suicidal ideation.  Musculoskeletal Global Assessment Spine, Ribs and Pelvis - no instability, subluxation or laxity. Right Upper Extremity - no instability, subluxation or laxity.  Lymphatic Head & Neck  General Head & Neck Lymphatics: Bilateral - Description - No Localized lymphadenopathy. Axillary  General Axillary Region: Bilateral - Description - No Localized lymphadenopathy. Femoral & Inguinal  Generalized Femoral & Inguinal Lymphatics: Left - Description - No Localized lymphadenopathy. Right - Description - No Localized lymphadenopathy.    Assessment & Plan Christine Hector MD; 07/01/2018 10:59 AM)  HISTORY OF DIVERTICULAR  ABSCESS OF COLON (Z87.19) Impression: Small abscess noted on CT scan. His hears to be resolving clinically.  Refill Cipro/Flagyl as needed until can have elective resection. She seems stable off the antibiotics.   DIVERTICULITIS OF LARGE INTESTINE WITHOUT PERFORATION OR ABSCESS WITHOUT BLEEDING (K57.32) Impression: At least 6 attacks of diverticulitis, the most recent one with documented  abscess improved on IV antibiotics. Recurrent symptoms requiring 2 more rounds of Cipro since that hospitalization 6 weeks ago.  I think she is reaching end-stage diverticulitis and would benefit from sigmoid colectomy to get rid of the problem area.  Trying calm this down and do elective minimally invasive resection. Reasonable robotic colectomy.  I agree with gastroenterology that she would benefit from colonoscopy to rule out any endoluminal masses. Perhaps we could time and so that she gets it the day before surgery, thus getting just one bowel prep. She tells me that that is going to be in late September. See if we can move that up.  I strongly recommend she quit smoking to minimize perioperative complications.  Seems like she's been cleared by pulmonary.  She is mostly cleared by cardiology. Due to get echocardiogram today for final staging per Dr. Radford Pax. Hopefully that is all the clearance she will need.  She is worried about ending up with a colostomy. I noted we can do this electively, the risks of that are much less. If she can quit smoking, it is even more less.  Current Plans Pt Education - [facility] Diverticular Disease ([place]) Pt Education - Pamphlet Given - Diverticulosis and Diverticulitis: discussed with patient and provided information. I recommended obtaining preoperative cardiac clearance. I am concerned about the health of the patient and the ability to tolerate the operation. Therefore, we will request clearance by cardiology to better assess operative risk & see if a reevaluation, further  workup, etc is needed. Also recommendations on how medications such as for anticoagulation and blood pressure should be managed/held/restarted after surgery.  PREOP COLON - ENCOUNTER FOR PREOPERATIVE EXAMINATION FOR GENERAL SURGICAL PROCEDURE (Z01.818)  Current Plans You are being scheduled for surgery- Our schedulers will call you.  You should hear from our office's scheduling department within 5 working days about the location, date, and time of surgery. We try to make accommodations for patient's preferences in scheduling surgery, but sometimes the OR schedule or the surgeon's schedule prevents [place] from making those accommodations.  If you have not heard from our office [phone]) in 5 working days, call the office and ask for your surgeon's nurse.  If you have other questions about your diagnosis, plan, or surgery, call the office and ask for your surgeon's nurse.  Written instructions provided The anatomy & physiology of the digestive tract was discussed. The pathophysiology of the colon was discussed. Natural history risks without surgery was discussed. I feel the risks of no intervention will lead to serious problems that outweigh the operative risks; therefore, I recommended a partial colectomy to remove the pathology. Minimally invasive (Robotic/Laparoscopic) & open techniques were discussed.  Risks such as bleeding, infection, abscess, leak, reoperation, possible ostomy, hernia, heart attack, death, and other risks were discussed. I noted a good likelihood this will help address the problem. Goals of post-operative recovery were discussed as well. Need for adequate nutrition, daily bowel regimen and healthy physical activity, to optimize recovery was noted as well. We will work to minimize complications. Educational materials were available as well. Questions were answered. The patient expresses understanding & wishes to proceed with surgery.  Pt Education - [facility]CS Colon  Bowel Prep 2018 ERAS/Miralax/Antibiotics Started Neomycin Sulfate 500 MG Oral Tablet, 2 (two) Tablet SEE NOTE, #6, 07/01/2018, No Refill. Local Order: TAKE TWO TABLETS AT 2 PM, 3 PM, AND 10 PM THE DAY PRIOR TO SURGERY Started Flagyl 500 MG Oral Tablet, 2 (two) Tablet SEE NOTE, #6, 07/01/2018, No Refill.  Local Order: Take at 2pm, 3pm, and 10pm the day prior to your colon operation Pt Education - Pamphlet Given - Laparoscopic Colorectal Surgery: discussed with patient and provided information. Pt Education - C[facility] Colectomy post-op instructions: discussed with patient and provided information.  TOBACCO ABUSE (Z72.0)  Current Plans Pt Education - CCS STOP SMOKING!  Christine Hector, MD, FACS, MASCRS Gastrointestinal and Minimally Invasive Surgery    [address] [place], [place] [id number] [phone] Main / Paging [phone] Fax

## 2018-07-11 ENCOUNTER — Telehealth: Payer: Self-pay | Admitting: Internal Medicine

## 2018-07-11 ENCOUNTER — Encounter: Payer: Self-pay | Admitting: Pulmonary Disease

## 2018-07-11 ENCOUNTER — Ambulatory Visit (INDEPENDENT_AMBULATORY_CARE_PROVIDER_SITE_OTHER): Payer: Medicaid Other | Admitting: Pulmonary Disease

## 2018-07-11 DIAGNOSIS — F17210 Nicotine dependence, cigarettes, uncomplicated: Secondary | ICD-10-CM

## 2018-07-11 DIAGNOSIS — K5732 Diverticulitis of large intestine without perforation or abscess without bleeding: Secondary | ICD-10-CM | POA: Diagnosis not present

## 2018-07-11 DIAGNOSIS — J449 Chronic obstructive pulmonary disease, unspecified: Secondary | ICD-10-CM

## 2018-07-11 DIAGNOSIS — F17200 Nicotine dependence, unspecified, uncomplicated: Secondary | ICD-10-CM

## 2018-07-11 HISTORY — DX: Chronic obstructive pulmonary disease, unspecified: J44.9

## 2018-07-11 NOTE — Telephone Encounter (Signed)
Received urgent request from [facility] that pt is needing to be scheduled for Robotic Resection of Sigmoid Colon in the near future under general anesthesia. Pt is needing to require written clearance in order to get a surgery date.  Pt has been called and an appt was made today at 2:30. Nothing further neede.d
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
## 2018-07-11 NOTE — Assessment & Plan Note (Signed)
   Congratulations on your decision to stop smoking  Smoking resources:  1 [place]  >>> Patient to call this resource and utilize it to help support her quit smoking >>> Keep up your hard work with stopping smoking  You can also contact the [facility] >>>For smoking cessation classes call [phone]  We do not recommend using e-cigarettes as a form of stopping smoking  Recommend that you receive the pneumonia vaccine as well as a flu vaccine when it is available.  Follow-up with our office in 6 to 8 weeks after discharge  Note your daily symptoms > remember "red flags" for COPD:   >>>Increase in cough >>>increase in sputum production >>>increase in shortness of breath or activity  intolerance.   If you notice these symptoms, please call the office to be seen.

## 2018-07-11 NOTE — Patient Instructions (Addendum)
Okay to proceed forward with surgery >>>Low to intermediate pulmonary risk >>>we will fax this to your MDs office   Congratulations on your decision to stop smoking  Smoking resources:  1 [place]  >>> Patient to call this resource and utilize it to help support her quit smoking >>> Keep up your hard work with stopping smoking  You can also contact the [facility] >>>For smoking cessation classes call [phone]  We do not recommend using e-cigarettes as a form of stopping smoking    Recommend that you receive the pneumonia vaccine as well as a flu vaccine when it is available.  Follow-up with our office in 6 to 8 weeks after discharge  Note your daily symptoms > remember "red flags" for COPD:   >>>Increase in cough >>>increase in sputum production >>>increase in shortness of breath or activity  intolerance.   If you notice these symptoms, please call the office to be seen.      Please contact the office if your symptoms worsen or you have concerns that you are not improving.   Thank you for choosing [facility] for your healthcare, and for allowing [place] to partner with you on your healthcare journey. I am thankful to be able to provide care to you today.   [name]    Coping with Quitting Smoking Quitting smoking is a physical and mental challenge. You will face cravings, withdrawal symptoms, and temptation. Before quitting, work with your health care provider to make a plan that can help you cope. Preparation can help you quit and keep you from giving in. How can I cope with cravings? Cravings usually last for 5-10 minutes. If you get through it, the craving will pass. Consider taking the following actions to help you cope with cravings:  Keep your mouth busy: ? Chew sugar-free gum. ? Suck on hard candies or a straw. ? Brush your teeth.  Keep your hands and body busy: ? Immediately change to a different activity when you feel a  craving. ? Squeeze or play with a ball. ? Do an activity or a hobby, like making bead jewelry, practicing needlepoint, or working with wood. ? Mix up your normal routine. ? Take a short exercise break. Go for a quick walk or run up and down stairs. ? Spend time in public places where smoking is not allowed.  Focus on doing something kind or helpful for someone else.  Call a friend or family member to talk during a craving.  Join a support group.  Call a quit line, such as 1-800-QUIT-NOW.  Talk with your health care provider about medicines that might help you cope with cravings and make quitting easier for you.  How can I deal with withdrawal symptoms? Your body may experience negative effects as it tries to get used to not having nicotine in the system. These effects are called withdrawal symptoms. They may include:  Feeling hungrier than normal.  Trouble concentrating.  Irritability.  Trouble sleeping.  Feeling depressed.  Restlessness and agitation.  Craving a cigarette.  To manage withdrawal symptoms:  Avoid places, people, and activities that trigger your cravings.  Remember why you want to quit.  Get plenty of sleep.  Avoid coffee and other caffeinated drinks. These may worsen some of your symptoms.  How can I handle social situations? Social situations can be difficult when you are quitting smoking, especially in the first few weeks. To manage this, you can:  Avoid parties,  bars, and other social situations where people might be smoking.  Avoid alcohol.  Leave right away if you have the urge to smoke.  Explain to your family and friends that you are quitting smoking. Ask for understanding and support.  Plan activities with friends or family where smoking is not an option.  What are some ways I can cope with stress? Wanting to smoke may cause stress, and stress can make you want to smoke. Find ways to manage your stress. Relaxation techniques can help.  For example:  Breathe slowly and deeply, in through your nose and out through your mouth.  Listen to soothing, relaxing music.  Talk with a family member or friend about your stress.  Light a candle.  Soak in a bath or take a shower.  Think about a peaceful place.  What are some ways I can prevent weight gain? Be aware that many people gain weight after they quit smoking. However, not everyone does. To keep from gaining weight, have a plan in place before you quit and stick to the plan after you quit. Your plan should include:  Having healthy snacks. When you have a craving, it may help to: ? Eat plain popcorn, crunchy carrots, celery, or other cut vegetables. ? Chew sugar-free gum.  Changing how you eat: ? Eat small portion sizes at meals. ? Eat 4-6 small meals throughout the day instead of 1-2 large meals a day. ? Be mindful when you eat. Do not watch television or do other things that might distract you as you eat.  Exercising regularly: ? Make time to exercise each day. If you do not have time for a long workout, do short bouts of exercise for 5-10 minutes several times a day. ? Do some form of strengthening exercise, like weight lifting, and some form of aerobic exercise, like running or swimming.  Drinking plenty of water or other low-calorie or no-calorie drinks. Drink 6-8 glasses of water daily, or as much as instructed by your health care provider.  Summary  Quitting smoking is a physical and mental challenge. You will face cravings, withdrawal symptoms, and temptation to smoke again. Preparation can help you as you go through these challenges.  You can cope with cravings by keeping your mouth busy (such as by chewing gum), keeping your body and hands busy, and making calls to family, friends, or a helpline for people who want to quit smoking.  You can cope with withdrawal symptoms by avoiding places where people smoke, avoiding drinks with caffeine, and getting plenty  of rest.  Ask your health care provider about the different ways to prevent weight gain, avoid stress, and handle social situations. This information is not intended to replace advice given to you by your health care provider. Make sure you discuss any questions you have with your health care provider. Document Released: 11/17/2016 Document Revised: 11/17/2016 Document Reviewed: 11/17/2016 Elsevier Interactive Patient Education  [employer].

## 2018-07-11 NOTE — Assessment & Plan Note (Signed)
  Congratulations on your decision to stop smoking  Smoking resources:  1 [place]  >>> Patient to call this resource and utilize it to help support her quit smoking >>> Keep up your hard work with stopping smoking  You can also contact the [facility] >>>For smoking cessation classes call [phone]  We do not recommend using e-cigarettes as a form of stopping smoking

## 2018-07-11 NOTE — Assessment & Plan Note (Signed)
Keep follow up with GI and [facility]

## 2018-08-01 ENCOUNTER — Encounter: Payer: Self-pay | Admitting: Cardiology

## 2018-08-01 ENCOUNTER — Ambulatory Visit: Payer: Medicaid Other | Admitting: Cardiology

## 2018-08-01 VITALS — BP 112/68 | HR 100 | Ht 66.0 in | Wt 207.8 lb

## 2018-08-01 DIAGNOSIS — I471 Supraventricular tachycardia: Secondary | ICD-10-CM

## 2018-08-01 DIAGNOSIS — R079 Chest pain, unspecified: Secondary | ICD-10-CM | POA: Diagnosis not present

## 2018-08-01 DIAGNOSIS — I4719 Other supraventricular tachycardia: Secondary | ICD-10-CM

## 2018-08-01 HISTORY — DX: Supraventricular tachycardia: I47.1

## 2018-08-01 HISTORY — DX: Other supraventricular tachycardia: I47.19

## 2018-08-01 NOTE — Patient Instructions (Signed)
Medication Instructions:  Your physician recommends that you continue on your current medications as directed. Please refer to the Current Medication list given to you today.  Follow-Up: Your physician recommends that you schedule a follow-up appointment in: As needed  If you need a refill on your cardiac medications before your next appointment, please call your pharmacy.
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
## 2018-08-09 ENCOUNTER — Ambulatory Visit: Payer: Medicaid Other | Admitting: Internal Medicine

## 2018-08-14 ENCOUNTER — Telehealth: Payer: Self-pay | Admitting: Internal Medicine

## 2018-08-14 ENCOUNTER — Encounter (HOSPITAL_COMMUNITY): Payer: Self-pay | Admitting: Obstetrics and Gynecology

## 2018-08-14 ENCOUNTER — Other Ambulatory Visit: Payer: Self-pay

## 2018-08-14 ENCOUNTER — Emergency Department (HOSPITAL_COMMUNITY)
Admission: EM | Admit: 2018-08-14 | Discharge: 2018-08-14 | Disposition: A | Discharge status: Home / Self Care | Payer: Medicaid Other | Attending: Emergency Medicine | Admitting: Emergency Medicine

## 2018-08-14 ENCOUNTER — Emergency Department (HOSPITAL_COMMUNITY): Payer: Medicaid Other

## 2018-08-14 DIAGNOSIS — F17210 Nicotine dependence, cigarettes, uncomplicated: Secondary | ICD-10-CM | POA: Insufficient documentation

## 2018-08-14 DIAGNOSIS — Z8541 Personal history of malignant neoplasm of cervix uteri: Secondary | ICD-10-CM | POA: Insufficient documentation

## 2018-08-14 DIAGNOSIS — J449 Chronic obstructive pulmonary disease, unspecified: Secondary | ICD-10-CM | POA: Diagnosis not present

## 2018-08-14 DIAGNOSIS — N83202 Unspecified ovarian cyst, left side: Secondary | ICD-10-CM | POA: Insufficient documentation

## 2018-08-14 DIAGNOSIS — R1032 Left lower quadrant pain: Secondary | ICD-10-CM | POA: Diagnosis present

## 2018-08-14 LAB — CBC
HCT: 44.5 % (ref 36.0–46.0)
Hemoglobin: 15.3 g/dL — ABNORMAL HIGH (ref 12.0–15.0)
MCH: 31.7 pg (ref 26.0–34.0)
MCHC: 34.4 g/dL (ref 30.0–36.0)
MCV: 92.3 fL (ref 78.0–100.0)
Platelets: 209 10*3/uL (ref 150–400)
RBC: 4.82 MIL/uL (ref 3.87–5.11)
RDW: 14 % (ref 11.5–15.5)
WBC: 10.4 10*3/uL (ref 4.0–10.5)

## 2018-08-14 LAB — I-STAT BETA HCG BLOOD, ED (MC, WL, AP ONLY): I-stat hCG, quantitative: <5 m[IU]/mL (ref <5)

## 2018-08-14 LAB — LIPASE, BLOOD: Lipase: 36 U/L (ref 11–51)

## 2018-08-14 LAB — COMPREHENSIVE METABOLIC PANEL
ALT: 17 U/L (ref 0–44)
AST: 17 U/L (ref 15–41)
Albumin: 3.9 g/dL (ref 3.5–5.0)
Alkaline Phosphatase: 66 U/L (ref 38–126)
Anion gap: 9 (ref 5–15)
BUN: 8 mg/dL (ref 6–20)
CO2: 22 mmol/L (ref 22–32)
Calcium: 8.8 mg/dL — ABNORMAL LOW (ref 8.9–10.3)
Chloride: 109 mmol/L (ref 98–111)
Creatinine, Ser: 0.62 mg/dL (ref 0.44–1.00)
GFR calc Af Amer: >60 mL/min (ref >60)
GFR calc non Af Amer: >60 mL/min (ref >60)
Glucose, Bld: 110 mg/dL — ABNORMAL HIGH (ref 70–99)
Potassium: 4 mmol/L (ref 3.5–5.1)
Sodium: 140 mmol/L (ref 135–145)
Total Bilirubin: 1 mg/dL (ref 0.3–1.2)
Total Protein: 6.6 g/dL (ref 6.5–8.1)

## 2018-08-14 MED ORDER — OXYCODONE-ACETAMINOPHEN 5-325 MG PO TABS: 1.0000 | ORAL_TABLET | Freq: Four times a day (QID) | ORAL | 0 refills | Status: DC | PRN

## 2018-08-14 MED ORDER — IOPAMIDOL (ISOVUE-300) INJECTION 61%
100.0000 mL | Freq: Once | INTRAVENOUS | Status: AC | PRN
  Administered 2018-08-14: 100 mL via INTRAVENOUS

## 2018-08-14 MED ORDER — OXYCODONE-ACETAMINOPHEN 5-325 MG PO TABS
2.0000 | ORAL_TABLET | Freq: Once | ORAL | Status: AC
  Administered 2018-08-14: 2 via ORAL
  Filled 2018-08-14: qty 2

## 2018-08-14 MED ORDER — IOPAMIDOL (ISOVUE-300) INJECTION 61%
INTRAVENOUS | Status: AC
  Filled 2018-08-14: qty 100
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
## 2018-08-14 NOTE — Telephone Encounter (Signed)
Noted  

## 2018-08-14 NOTE — Telephone Encounter (Signed)
I called the patient. She is in the [employer] . She said her pain was severe and she went to the ER.

## 2018-08-14 NOTE — ED Triage Notes (Signed)
Pt reports she is having severe abdominal pain. Pt reports she tried calling her GI doctor, as she is scheduled to have part of her colon removed later this month due to an abscess in her abdomen. PT reports the GI doc did not call her back. Pt is tearful in triage

## 2018-08-14 NOTE — Telephone Encounter (Signed)
Chart review  Has hemorrhagic ovarian cyst  Should still be ok for colonoscopy later this month barring other problems

## 2018-08-16 ENCOUNTER — Telehealth: Payer: Self-pay | Admitting: Internal Medicine

## 2018-08-16 NOTE — Telephone Encounter (Signed)
Left message for patient to call back  

## 2018-08-16 NOTE — Telephone Encounter (Signed)
Sheri,  I don't think we need to give her anymore antibiotics as CT suggests almost complete resolution of the diverticulitis and her recent acute abdominal pain was likely from the new hemorrhagic cyst seen on scan.  She is already scheduled for colon resection with Dr. Johney Maine on 9/26 because of the recurrent diverticulitis. We are doing a pre-op colon on her a day or so before since last colonoscopy was several years ago.  I think since there is only minimal residual colonic inflammation  Dr. Carlean Purl will be okay proceeding with her scheduled colonoscopy which is still a couple of weeks away.  Otherwise, we will have to ask Dr. Johney Maine to reschedule the colon resection. NOW, if she has fever /  recurrent diverticular pain between now and colonoscopy then we need to be notified Thanks Nevin Bloodgood I will cc Carlean Purl

## 2018-08-16 NOTE — Telephone Encounter (Signed)
Pt has questions regarding results of Ct scan.

## 2018-08-16 NOTE — Telephone Encounter (Signed)
Patient was in the ED for abdominal pain. She has a hemorrhagic ovarian cyst, but still shows some colonic edema from prior diverticulitis.  She is scheduled for endo/colon on 08/28/18.  Ok to proceed?  Nevin Bloodgood and Dr. Carlean Purl please review and advise

## 2018-08-19 NOTE — Telephone Encounter (Signed)
Patient notified she will touch base with Dr. Johney Maine as well.  Dr. Carlean Purl okay to keep her procedure as scheduled for 08/28/18?

## 2018-08-19 NOTE — Telephone Encounter (Signed)
I agree  We should plan to proceed with the colonoscopy unless there is deterioration

## 2018-08-23 NOTE — Progress Notes (Signed)
CBC, CMP, SERUM PREG 08-14-18 Epic   LOV CARDIO, DR TRACI TURNER 08-01-18 Epic    ECHO 07-01-18 Epic   STRESS TEST 06-20-18 Epic   EKG, CXR 05-01-18 Epic

## 2018-08-23 NOTE — Patient Instructions (Signed)
Christine Shah  08/23/2018   Your procedure is scheduled on: 08-29-18   Report to [facility] Main  Entrance    Report to admitting at 10:15AM    Call this number if you have problems the morning of surgery [phone]     Remember: [place]! [place] 2 PRESURGERY ENSURE [place] AT 10:00 PM. NOTHING BY MOUTH EXCEPT CLEAR LIQUIDS UNTIL THREE HOURS PRIOR TO SCHEDULED SURGERY. PLEASE DRINK 1 PRESURGERY ENSURE DRINK THE MORNING OF SURGERY, PER SURGEON ORDER, 3 HOURS PRIOR TO SCHEDULED SURGERY TIME WHICH NEEDS TO BE COMPLETED AT ___9:15AM_____. NOTHING BY MOUTH AFTER 9:15AM . BRUSH YOUR TEETH MORNING OF SURGERY AND RINSE YOUR MOUTH OUT, NO CHEWING GUM CANDY OR MINTS.       CLEAR LIQUID DIET   Foods Allowed                                                                     Foods Excluded  Coffee and tea, regular and decaf                             liquids that you cannot  Plain Jell-O in any flavor                                             see through such as: Fruit ices (not with fruit pulp)                                     milk, soups, orange juice  Iced Popsicles                                    All solid food Carbonated beverages, regular and diet                                    Cranberry, grape and apple juices Sports drinks like Gatorade Lightly seasoned clear broth or consume(fat free) Sugar, honey syrup  Sample Menu Breakfast                                Lunch                                     Supper Cranberry juice                    Beef broth                            Chicken broth Jell-O  Grape juice                           Apple juice Coffee or tea                        Jell-O                                      Popsicle                                                Coffee or tea                        Coffee or  tea  _____________________________________________________________________    Take these medicines the morning of surgery with A SIP OF WATER: ALBUTEROL INHALER IF NEEDED (PLEASE BRING)                                 You may not have any metal on your body including hair pins and              piercings  Do not wear jewelry, make-up, lotions, powders or perfumes, deodorant             Do not wear nail polish.  Do not shave  48 hours prior to surgery.             Do not bring valuables to the hospital. [place].  Contacts, dentures or bridgework may not be worn into surgery.  Leave suitcase in the car. After surgery it may be brought to your room.                  Please read over the following fact sheets you were given: _____________________________________________________________________             [facility] - Preparing for Surgery Before surgery, you can play an important role.  Because skin is not sterile, your skin needs to be as free of germs as possible.  You can reduce the number of germs on your skin by washing with CHG (chlorahexidine gluconate) soap before surgery.  CHG is an antiseptic cleaner which kills germs and bonds with the skin to continue killing germs even after washing. Please DO NOT use if you have an allergy to CHG or antibacterial soaps.  If your skin becomes reddened/irritated stop using the CHG and inform your nurse when you arrive at Short Stay. Do not shave (including legs and underarms) for at least 48 hours prior to the first CHG shower.  You may shave your face/neck. Please follow these instructions carefully:  1.  Shower with CHG Soap the night before surgery and the  morning of Surgery.  2.  If you choose to wash your hair, wash your hair first as usual with your  normal  shampoo.  3.  After you shampoo, rinse your hair and body thoroughly to remove the  shampoo.  4.  Use  CHG as you would any other liquid soap.  You can apply chg directly  to the skin and wash                       Gently with a scrungie or clean washcloth.  5.  Apply the CHG Soap to your body ONLY FROM THE NECK DOWN.   Do not use on face/ open                           Wound or open sores. Avoid contact with eyes, ears mouth and genitals (private parts).                       Wash face,  Genitals (private parts) with your normal soap.             6.  Wash thoroughly, paying special attention to the area where your surgery  will be performed.  7.  Thoroughly rinse your body with warm water from the neck down.  8.  DO NOT shower/wash with your normal soap after using and rinsing off  the CHG Soap.                9.  Pat yourself dry with a clean towel.            10.  Wear clean pajamas.            11.  Place clean sheets on your bed the night of your first shower and do not  sleep with pets. Day of Surgery : Do not apply any lotions/deodorants the morning of surgery.  Please wear clean clothes to the hospital/surgery center.  FAILURE TO FOLLOW THESE INSTRUCTIONS MAY RESULT IN THE CANCELLATION OF YOUR SURGERY PATIENT SIGNATURE_________________________________  NURSE SIGNATURE__________________________________  ________________________________________________________________________   Adam Phenix  An incentive spirometer is a tool that can help keep your lungs clear and active. This tool measures how well you are filling your lungs with each breath. Taking long deep breaths may help reverse or decrease the chance of developing breathing (pulmonary) problems (especially infection) following:  A long period of time when you are unable to move or be active. BEFORE THE PROCEDURE   If the spirometer includes an indicator to show your best effort, your nurse or respiratory therapist will set it to a desired goal.  If possible, sit up straight or lean slightly forward. Try not to  slouch.  Hold the incentive spirometer in an upright position. INSTRUCTIONS FOR USE  1. Sit on the edge of your bed if possible, or sit up as far as you can in bed or on a chair. 2. Hold the incentive spirometer in an upright position. 3. Breathe out normally. 4. Place the mouthpiece in your mouth and seal your lips tightly around it. 5. Breathe in slowly and as deeply as possible, raising the piston or the ball toward the top of the column. 6. Hold your breath for 3-5 seconds or for as long as possible. Allow the piston or ball to fall to the bottom of the column. 7. Remove the mouthpiece from your mouth and breathe out normally. 8. Rest for a few seconds and repeat Steps 1 through 7 at least 10 times every 1-2 hours when you are awake. Take your time and take a few normal breaths between deep breaths. 9. The spirometer may include an indicator to  show your best effort. Use the indicator as a goal to work toward during each repetition. 10. After each set of 10 deep breaths, practice coughing to be sure your lungs are clear. If you have an incision (the cut made at the time of surgery), support your incision when coughing by placing a pillow or rolled up towels firmly against it. Once you are able to get out of bed, walk around indoors and cough well. You may stop using the incentive spirometer when instructed by your caregiver.  RISKS AND COMPLICATIONS  Take your time so you do not get dizzy or light-headed.  If you are in pain, you may need to take or ask for pain medication before doing incentive spirometry. It is harder to take a deep breath if you are having pain. AFTER USE  Rest and breathe slowly and easily.  It can be helpful to keep track of a log of your progress. Your caregiver can provide you with a simple table to help with this. If you are using the spirometer at home, follow these instructions: [place] IF:   You are having difficultly using the spirometer.  You  have trouble using the spirometer as often as instructed.  Your pain medication is not giving enough relief while using the spirometer.  You develop fever of 100.5 F (38.1 C) or higher. SEEK IMMEDIATE MEDICAL CARE IF:   You cough up bloody sputum that had not been present before.  You develop fever of 102 F (38.9 C) or greater.  You develop worsening pain at or near the incision site. MAKE SURE YOU:   Understand these instructions.  Will watch your condition.  Will get help right away if you are not doing well or get worse. Document Released: 04/02/2007 Document Revised: 02/12/2012 Document Reviewed: 06/03/2007 [facility] Patient Information 2014 [place], [place].   ________________________________________________________________________

## 2018-08-26 ENCOUNTER — Other Ambulatory Visit: Payer: Self-pay

## 2018-08-26 ENCOUNTER — Encounter (HOSPITAL_COMMUNITY)
Admission: RE | Admit: 2018-08-26 | Discharge: 2018-08-26 | Disposition: A | Discharge status: Home / Self Care | Payer: Medicaid Other | Source: Ambulatory Visit | Attending: Surgery | Admitting: Surgery

## 2018-08-26 ENCOUNTER — Encounter (HOSPITAL_COMMUNITY): Payer: Self-pay

## 2018-08-26 DIAGNOSIS — Z01812 Encounter for preprocedural laboratory examination: Secondary | ICD-10-CM | POA: Insufficient documentation

## 2018-08-26 HISTORY — DX: Unspecified ovarian cyst, unspecified side: N83.209

## 2018-08-26 LAB — HCG, SERUM, QUALITATIVE: Preg, Serum: NEGATIVE

## 2018-08-26 LAB — HEMOGLOBIN A1C
HEMOGLOBIN A1C: 5.5 % (ref 4.8–5.6)
Mean Plasma Glucose: 111.15 mg/dL

## 2018-08-26 LAB — ABO/RH: ABO/RH(D): B POS

## 2018-08-28 ENCOUNTER — Encounter: Payer: Self-pay | Admitting: Internal Medicine

## 2018-08-28 ENCOUNTER — Ambulatory Visit (AMBULATORY_SURGERY_CENTER): Payer: Medicaid Other | Admitting: Internal Medicine

## 2018-08-28 VITALS — BP 130/68 | HR 51 | Temp 98.4°F | Resp 11 | Ht 66.0 in | Wt 207.0 lb

## 2018-08-28 DIAGNOSIS — K621 Rectal polyp: Secondary | ICD-10-CM | POA: Diagnosis not present

## 2018-08-28 DIAGNOSIS — Z1211 Encounter for screening for malignant neoplasm of colon: Secondary | ICD-10-CM

## 2018-08-28 DIAGNOSIS — K5732 Diverticulitis of large intestine without perforation or abscess without bleeding: Secondary | ICD-10-CM

## 2018-08-28 DIAGNOSIS — D129 Benign neoplasm of anus and anal canal: Secondary | ICD-10-CM

## 2018-08-28 DIAGNOSIS — K449 Diaphragmatic hernia without obstruction or gangrene: Secondary | ICD-10-CM

## 2018-08-28 DIAGNOSIS — K2270 Barrett's esophagus without dysplasia: Secondary | ICD-10-CM

## 2018-08-28 DIAGNOSIS — D128 Benign neoplasm of rectum: Secondary | ICD-10-CM

## 2018-08-28 MED ORDER — SODIUM CHLORIDE 0.9 % IV SOLN
INTRAVENOUS | Status: DC
  Filled 2018-08-28: qty 6

## 2018-08-28 MED ORDER — SODIUM CHLORIDE 0.9 % IV SOLN: 500.0000 mL | Freq: Once | INTRAVENOUS | Status: DC

## 2018-08-28 MED ORDER — BUPIVACAINE LIPOSOME 1.3 % IJ SUSP
20.0000 mL | Freq: Once | INTRAMUSCULAR | Status: DC
  Filled 2018-08-28: qty 20

## 2018-08-28 NOTE — Progress Notes (Signed)
Report given to PACU, vss 

## 2018-08-28 NOTE — Op Note (Signed)
[place] Patient Name: Christine Shah Procedure Date: 08/28/2018 1:50 PM MRN: [id number] Endoscopist: Gatha Mayer , MD Age: 52 Referring MD:  Date of Birth: May 28, 1966 Gender: Female Account #: [id number] Procedure:                Upper GI endoscopy Indications:              Barrett's esophagus, Follow-up of Barrett's                            esophagus Medicines:                Propofol per Anesthesia, Monitored Anesthesia Care Procedure:                Pre-Anesthesia Assessment:                           - Prior to the procedure, a History and Physical                            was performed, and patient medications and                            allergies were reviewed. The patient's tolerance of                            previous anesthesia was also reviewed. The risks                            and benefits of the procedure and the sedation                            options and risks were discussed with the patient.                            All questions were answered, and informed consent                            was obtained. Prior Anticoagulants: The patient has                            taken no previous anticoagulant or antiplatelet                            agents. ASA Grade Assessment: II - A patient with                            mild systemic disease. After reviewing the risks                            and benefits, the patient was deemed in                            satisfactory condition to undergo the procedure.  After obtaining informed consent, the endoscope was                            passed under direct vision. Throughout the                            procedure, the patient's blood pressure, pulse, and                            oxygen saturations were monitored continuously. The                            Model GIF-HQ190 [id number]) scope was introduced                            through the mouth, and  advanced to the second part                            of duodenum. The upper GI endoscopy was                            accomplished without difficulty. The patient                            tolerated the procedure well. Scope In: Scope Out: Findings:                 There were esophageal mucosal changes secondary to                            established short-segment Barrett's disease present                            in the distal esophagus. The maximum longitudinal                            extent of these mucosal changes was 2 cm in length.                            Mucosa was biopsied with a cold forceps for                            histology from 33 to 35 cm from the incisors. One                            specimen bottle was sent to pathology. Verification                            of patient identification for the specimen was                            done. Estimated blood loss was minimal.  A 5 cm hiatal hernia was present.                           The exam was otherwise without abnormality.                           The cardia and gastric fundus were normal on                            retroflexion. Complications:            No immediate complications. Estimated Blood Loss:     Estimated blood loss was minimal. Impression:               - Esophageal mucosal changes secondary to                            established short-segment Barrett's disease.                            Biopsied. 3 tongues 2 cm max                           - 5 cm hiatal hernia.                           - The examination was otherwise normal. Recommendation:           - Patient has a contact number available for                            emergencies. The signs and symptoms of potential                            delayed complications were discussed with the                            patient. Return to normal activities tomorrow.                            Written  discharge instructions were provided to the                            patient.                           - Clear liquid diet.                           - Await pathology results.                           - See the other procedure note for documentation of                            additional recommendations. Gatha Mayer, MD 08/28/2018 2:25:58 PM This report has been signed electronically.

## 2018-08-28 NOTE — Op Note (Signed)
[place] Patient Name: Christine Shah Procedure Date: 08/28/2018 1:50 PM MRN: [id number] Endoscopist: Gatha Mayer , MD Age: 52 Referring MD:  Date of Birth: Mar 29, 1966 Gender: Female Account #: [id number] Procedure:                Colonoscopy Indications:              Screening for colorectal malignant neoplasm Medicines:                Propofol per Anesthesia, Monitored Anesthesia Care Procedure:                Pre-Anesthesia Assessment:                           - Prior to the procedure, a History and Physical                            was performed, and patient medications and                            allergies were reviewed. The patient's tolerance of                            previous anesthesia was also reviewed. The risks                            and benefits of the procedure and the sedation                            options and risks were discussed with the patient.                            All questions were answered, and informed consent                            was obtained. Prior Anticoagulants: The patient has                            taken no previous anticoagulant or antiplatelet                            agents. ASA Grade Assessment: II - A patient with                            mild systemic disease. After reviewing the risks                            and benefits, the patient was deemed in                            satisfactory condition to undergo the procedure.                           After obtaining informed consent, the colonoscope  was passed under direct vision. Throughout the                            procedure, the patient's blood pressure, pulse, and                            oxygen saturations were monitored continuously. The                            Colonoscope was introduced through the anus and                            advanced to the the cecum, identified by   appendiceal orifice and ileocecal valve. The                            colonoscopy was performed without difficulty. The                            patient tolerated the procedure well. The quality                            of the bowel preparation was excellent. The                            ileocecal valve, appendiceal orifice, and rectum                            were photographed. Scope In: 2:06:24 PM Scope Out: 2:19:02 PM Scope Withdrawal Time: 0 hours 9 minutes 47 seconds  Total Procedure Duration: 0 hours 12 minutes 38 seconds  Findings:                 The perianal and digital rectal examinations were                            normal.                           Two sessile polyps were found in the rectum. The                            polyps were diminutive in size. These polyps were                            removed with a cold biopsy forceps. Resection and                            retrieval were complete. Verification of patient                            identification for the specimen was done. Estimated                            blood loss was minimal.  Multiple small and large-mouthed diverticula were                            found in the sigmoid colon.                           The exam was otherwise without abnormality on                            direct and retroflexion views. Complications:            No immediate complications. Estimated Blood Loss:     Estimated blood loss was minimal. Impression:               - Two diminutive polyps in the rectum, removed with                            a cold biopsy forceps. Resected and retrieved.                           - Diverticulosis in the sigmoid colon.                           - The examination was otherwise normal on direct                            and retroflexion views. Recommendation:           - Patient has a contact number available for                            emergencies.  The signs and symptoms of potential                            delayed complications were discussed with the                            patient. Return to normal activities tomorrow.                            Written discharge instructions were provided to the                            patient.                           - Clear liquid diet.                           - Continue present medications.                           - Repeat colonoscopy is recommended. The                            colonoscopy date will be determined after pathology  results from today's exam become available for                            review.                           - HAVING SEGMENTAL COLECTOMY FOR PRIOR                            DIVERTICUITIS AND ABSCESS TOMORROW                           OK TO PROCEED Gatha Mayer, MD 08/28/2018 2:29:43 PM This report has been signed electronically.

## 2018-08-28 NOTE — Progress Notes (Signed)
Called to room to assist during endoscopic procedure.  Patient ID and intended procedure confirmed with present staff. Received instructions for my participation in the procedure from the performing physician.  

## 2018-08-28 NOTE — Patient Instructions (Addendum)
I took biopsies of the Barrett's esophagus - nothing suspicious seen.  I removed 2 tiny rectal polyps.  There was diverticulosis but no diverticulitis today.  Good luck with surgery tomorrow!  I appreciate the opportunity to care for you. Gatha Mayer, MD, [facility]  ** Handouts given on polyps, diverticulosis, and hiatal hernia **   YOU HAD AN ENDOSCOPIC PROCEDURE TODAY AT [place]:   Refer to the procedure report that was given to you for any specific questions about what was found during the examination.  If the procedure report does not answer your questions, please call your gastroenterologist to clarify.  If you requested that your care partner not be given the details of your procedure findings, then the procedure report has been included in a sealed envelope for you to review at your convenience later.  YOU SHOULD EXPECT: Some feelings of bloating in the abdomen. Passage of more gas than usual.  Walking can help get rid of the air that was put into your GI tract during the procedure and reduce the bloating. If you had a lower endoscopy (such as a colonoscopy or flexible sigmoidoscopy) you may notice spotting of blood in your stool or on the toilet paper. If you underwent a bowel prep for your procedure, you may not have a normal bowel movement for a few days.  Please Note:  You might notice some irritation and congestion in your nose or some drainage.  This is from the oxygen used during your procedure.  There is no need for concern and it should clear up in a day or so.  SYMPTOMS TO REPORT IMMEDIATELY:   Following lower endoscopy (colonoscopy or flexible sigmoidoscopy):  Excessive amounts of blood in the stool  Significant tenderness or worsening of abdominal pains  Swelling of the abdomen that is new, acute  Fever of 100F or higher   Following upper endoscopy (EGD)  Vomiting of blood or coffee ground material  New chest pain or pain under the  shoulder blades  Painful or persistently difficult swallowing  New shortness of breath  Fever of 100F or higher  Black, tarry-looking stools  For urgent or emergent issues, a gastroenterologist can be reached at any hour by calling [phone].   DIET:  We do recommend a small meal at first, but then you may proceed to your regular diet.  Drink plenty of fluids but you should avoid alcoholic beverages for 24 hours.  ACTIVITY:  You should plan to take it easy for the rest of today and you should NOT DRIVE or use heavy machinery until tomorrow (because of the sedation medicines used during the test).    FOLLOW UP: Our staff will call the number listed on your records the next business day following your procedure to check on you and address any questions or concerns that you may have regarding the information given to you following your procedure. If we do not reach you, we will leave a message.  However, if you are feeling well and you are not experiencing any problems, there is no need to return our call.  We will assume that you have returned to your regular daily activities without incident.  If any biopsies were taken you will be contacted by phone or by letter within the next 1-3 weeks.  Please call us at [phone] if you have not heard about the biopsies in 3 weeks.    SIGNATURES/CONFIDENTIALITY: You and/or your care partner have signed paperwork  which will be entered into your electronic medical record.  These signatures attest to the fact that that the information above on your After Visit Summary has been reviewed and is understood.  Full responsibility of the confidentiality of this discharge information lies with you and/or your care-partner.

## 2018-08-29 ENCOUNTER — Telehealth: Payer: Self-pay

## 2018-08-29 ENCOUNTER — Encounter (HOSPITAL_COMMUNITY)
Admission: RE | Disposition: A | Discharge status: Home / Self Care | Payer: Self-pay | Source: Ambulatory Visit | Attending: Surgery

## 2018-08-29 ENCOUNTER — Inpatient Hospital Stay (HOSPITAL_COMMUNITY)
Admission: RE | Admit: 2018-08-29 | Discharge: 2018-08-31 | DRG: 331 | Disposition: A | Discharge status: Home / Self Care | Payer: Medicaid Other | Source: Ambulatory Visit | Attending: Surgery | Admitting: Surgery

## 2018-08-29 ENCOUNTER — Encounter (HOSPITAL_COMMUNITY): Payer: Self-pay | Admitting: Anesthesiology

## 2018-08-29 ENCOUNTER — Telehealth (HOSPITAL_COMMUNITY): Payer: Self-pay | Admitting: *Deleted

## 2018-08-29 ENCOUNTER — Inpatient Hospital Stay (HOSPITAL_COMMUNITY): Payer: Medicaid Other | Admitting: Anesthesiology

## 2018-08-29 DIAGNOSIS — K5720 Diverticulitis of large intestine with perforation and abscess without bleeding: Principal | ICD-10-CM | POA: Diagnosis present

## 2018-08-29 DIAGNOSIS — J449 Chronic obstructive pulmonary disease, unspecified: Secondary | ICD-10-CM | POA: Diagnosis present

## 2018-08-29 DIAGNOSIS — Z86010 Personal history of colonic polyps: Secondary | ICD-10-CM | POA: Diagnosis not present

## 2018-08-29 DIAGNOSIS — E669 Obesity, unspecified: Secondary | ICD-10-CM | POA: Diagnosis present

## 2018-08-29 DIAGNOSIS — K2270 Barrett's esophagus without dysplasia: Secondary | ICD-10-CM | POA: Diagnosis present

## 2018-08-29 DIAGNOSIS — G709 Myoneural disorder, unspecified: Secondary | ICD-10-CM | POA: Diagnosis present

## 2018-08-29 DIAGNOSIS — Z881 Allergy status to other antibiotic agents status: Secondary | ICD-10-CM

## 2018-08-29 DIAGNOSIS — Z885 Allergy status to narcotic agent status: Secondary | ICD-10-CM | POA: Diagnosis not present

## 2018-08-29 DIAGNOSIS — M5432 Sciatica, left side: Secondary | ICD-10-CM | POA: Diagnosis present

## 2018-08-29 DIAGNOSIS — Z720 Tobacco use: Secondary | ICD-10-CM | POA: Diagnosis present

## 2018-08-29 DIAGNOSIS — F17210 Nicotine dependence, cigarettes, uncomplicated: Secondary | ICD-10-CM | POA: Diagnosis present

## 2018-08-29 DIAGNOSIS — F419 Anxiety disorder, unspecified: Secondary | ICD-10-CM | POA: Diagnosis present

## 2018-08-29 DIAGNOSIS — K219 Gastro-esophageal reflux disease without esophagitis: Secondary | ICD-10-CM | POA: Diagnosis present

## 2018-08-29 DIAGNOSIS — K5732 Diverticulitis of large intestine without perforation or abscess without bleeding: Secondary | ICD-10-CM

## 2018-08-29 DIAGNOSIS — N83292 Other ovarian cyst, left side: Secondary | ICD-10-CM | POA: Diagnosis present

## 2018-08-29 DIAGNOSIS — Z6833 Body mass index (BMI) 33.0-33.9, adult: Secondary | ICD-10-CM

## 2018-08-29 DIAGNOSIS — G8929 Other chronic pain: Secondary | ICD-10-CM | POA: Diagnosis present

## 2018-08-29 DIAGNOSIS — R51 Headache: Secondary | ICD-10-CM | POA: Diagnosis present

## 2018-08-29 DIAGNOSIS — Z79899 Other long term (current) drug therapy: Secondary | ICD-10-CM

## 2018-08-29 HISTORY — PX: PROCTOSCOPY: SHX2266

## 2018-08-29 HISTORY — PX: XI ROBOTIC ASSISTED LOWER ANTERIOR RESECTION: SHX6558

## 2018-08-29 LAB — TYPE AND SCREEN
ABO/RH(D): B POS
ANTIBODY SCREEN: NEGATIVE

## 2018-08-29 SURGERY — RESECTION, RECTUM, LOW ANTERIOR, ROBOT-ASSISTED
Anesthesia: General | Site: Rectum

## 2018-08-29 MED ORDER — ALBUTEROL SULFATE (2.5 MG/3ML) 0.083% IN NEBU: 2.5000 mg | INHALATION_SOLUTION | Freq: Four times a day (QID) | RESPIRATORY_TRACT | Status: DC | PRN

## 2018-08-29 MED ORDER — PHENYLEPHRINE 40 MCG/ML (10ML) SYRINGE FOR IV PUSH (FOR BLOOD PRESSURE SUPPORT)
PREFILLED_SYRINGE | INTRAVENOUS | Status: DC | PRN
  Administered 2018-08-29: 80 ug via INTRAVENOUS

## 2018-08-29 MED ORDER — EPHEDRINE 5 MG/ML INJ
INTRAVENOUS | Status: AC
  Filled 2018-08-29: qty 10

## 2018-08-29 MED ORDER — METOCLOPRAMIDE HCL 5 MG/ML IJ SOLN: 10.0000 mg | Freq: Four times a day (QID) | INTRAMUSCULAR | Status: DC | PRN

## 2018-08-29 MED ORDER — SUGAMMADEX SODIUM 200 MG/2ML IV SOLN
INTRAVENOUS | Status: DC | PRN
  Administered 2018-08-29: 200 mg via INTRAVENOUS

## 2018-08-29 MED ORDER — ONDANSETRON HCL 4 MG/2ML IJ SOLN
INTRAMUSCULAR | Status: DC | PRN
  Administered 2018-08-29: 4 mg via INTRAVENOUS

## 2018-08-29 MED ORDER — PHENYLEPHRINE 40 MCG/ML (10ML) SYRINGE FOR IV PUSH (FOR BLOOD PRESSURE SUPPORT)
PREFILLED_SYRINGE | INTRAVENOUS | Status: AC
  Filled 2018-08-29: qty 10

## 2018-08-29 MED ORDER — LIDOCAINE 2% (20 MG/ML) 5 ML SYRINGE
INTRAMUSCULAR | Status: DC | PRN
  Administered 2018-08-29: 80 mg via INTRAVENOUS

## 2018-08-29 MED ORDER — ESMOLOL HCL 100 MG/10ML IV SOLN
INTRAVENOUS | Status: DC | PRN
  Administered 2018-08-29: 10 mg via INTRAVENOUS

## 2018-08-29 MED ORDER — ALVIMOPAN 12 MG PO CAPS
12.0000 mg | ORAL_CAPSULE | Freq: Two times a day (BID) | ORAL | Status: DC
  Administered 2018-08-30 – 2018-08-31 (×2): 12 mg via ORAL
  Filled 2018-08-29 (×4): qty 1

## 2018-08-29 MED ORDER — BUPIVACAINE LIPOSOME 1.3 % IJ SUSP
INTRAMUSCULAR | Status: DC | PRN
  Administered 2018-08-29: 20 mL

## 2018-08-29 MED ORDER — PROMETHAZINE HCL 25 MG/ML IJ SOLN
INTRAMUSCULAR | Status: AC
  Administered 2018-08-29: 6.25 mg via INTRAVENOUS
  Filled 2018-08-29: qty 1

## 2018-08-29 MED ORDER — HYDROMORPHONE HCL 1 MG/ML IJ SOLN
0.5000 mg | INTRAMUSCULAR | Status: DC | PRN
  Administered 2018-08-31: 2 mg via INTRAVENOUS
  Filled 2018-08-29: qty 2

## 2018-08-29 MED ORDER — METRONIDAZOLE 500 MG PO TABS
1000.0000 mg | ORAL_TABLET | ORAL | Status: DC
  Filled 2018-08-29: qty 2

## 2018-08-29 MED ORDER — METHOCARBAMOL 500 MG PO TABS: 1000.0000 mg | ORAL_TABLET | Freq: Four times a day (QID) | ORAL | Status: DC | PRN

## 2018-08-29 MED ORDER — CELECOXIB 200 MG PO CAPS
200.0000 mg | ORAL_CAPSULE | ORAL | Status: AC
  Administered 2018-08-29: 200 mg via ORAL
  Filled 2018-08-29: qty 1

## 2018-08-29 MED ORDER — ALBUTEROL SULFATE HFA 108 (90 BASE) MCG/ACT IN AERS: 2.0000 | INHALATION_SPRAY | Freq: Four times a day (QID) | RESPIRATORY_TRACT | Status: DC | PRN

## 2018-08-29 MED ORDER — LIDOCAINE 20MG/ML (2%) 15 ML SYRINGE OPTIME
INTRAMUSCULAR | Status: DC | PRN
  Administered 2018-08-29: 1.5 mg/kg/h via INTRAVENOUS

## 2018-08-29 MED ORDER — DEXAMETHASONE SODIUM PHOSPHATE 10 MG/ML IJ SOLN
INTRAMUSCULAR | Status: DC | PRN
  Administered 2018-08-29: 10 mg via INTRAVENOUS

## 2018-08-29 MED ORDER — BUPIVACAINE-EPINEPHRINE (PF) 0.25% -1:200000 IJ SOLN
INTRAMUSCULAR | Status: DC | PRN
  Administered 2018-08-29: 50 mL

## 2018-08-29 MED ORDER — GLYCOPYRROLATE PF 0.2 MG/ML IJ SOSY
PREFILLED_SYRINGE | INTRAMUSCULAR | Status: DC | PRN
  Administered 2018-08-29: .2 mg via INTRAVENOUS

## 2018-08-29 MED ORDER — SODIUM CHLORIDE 0.9 % IV SOLN
2.0000 g | INTRAVENOUS | Status: AC
  Administered 2018-08-29: 2 g via INTRAVENOUS
  Filled 2018-08-29: qty 2

## 2018-08-29 MED ORDER — ONDANSETRON HCL 4 MG/2ML IJ SOLN
INTRAMUSCULAR | Status: AC
  Filled 2018-08-29: qty 2

## 2018-08-29 MED ORDER — SCOPOLAMINE 1 MG/3DAYS TD PT72
MEDICATED_PATCH | TRANSDERMAL | Status: DC | PRN
  Administered 2018-08-29: 1 via TRANSDERMAL

## 2018-08-29 MED ORDER — MIDAZOLAM HCL 5 MG/5ML IJ SOLN
INTRAMUSCULAR | Status: DC | PRN
  Administered 2018-08-29: 2 mg via INTRAVENOUS

## 2018-08-29 MED ORDER — SODIUM CHLORIDE 0.9 % IV SOLN
2.0000 g | Freq: Two times a day (BID) | INTRAVENOUS | Status: AC
  Administered 2018-08-29: 2 g via INTRAVENOUS
  Filled 2018-08-29: qty 2

## 2018-08-29 MED ORDER — BUPIVACAINE-EPINEPHRINE (PF) 0.25% -1:200000 IJ SOLN
INTRAMUSCULAR | Status: AC
  Filled 2018-08-29: qty 60

## 2018-08-29 MED ORDER — PROCHLORPERAZINE EDISYLATE 10 MG/2ML IJ SOLN: 5.0000 mg | Freq: Four times a day (QID) | INTRAMUSCULAR | Status: DC | PRN

## 2018-08-29 MED ORDER — KETAMINE HCL 10 MG/ML IJ SOLN
INTRAMUSCULAR | Status: DC | PRN
  Administered 2018-08-29: 20 mg via INTRAVENOUS
  Administered 2018-08-29: 10 mg via INTRAVENOUS
  Administered 2018-08-29: 20 mg via INTRAVENOUS
  Administered 2018-08-29: 10 mg via INTRAVENOUS

## 2018-08-29 MED ORDER — ROCURONIUM BROMIDE 10 MG/ML (PF) SYRINGE
PREFILLED_SYRINGE | INTRAVENOUS | Status: DC | PRN
  Administered 2018-08-29: 50 mg via INTRAVENOUS
  Administered 2018-08-29 (×2): 10 mg via INTRAVENOUS
  Administered 2018-08-29: 20 mg via INTRAVENOUS

## 2018-08-29 MED ORDER — FENTANYL CITRATE (PF) 100 MCG/2ML IJ SOLN
25.0000 ug | INTRAMUSCULAR | Status: DC | PRN
  Administered 2018-08-29: 50 ug via INTRAVENOUS

## 2018-08-29 MED ORDER — FENTANYL CITRATE (PF) 100 MCG/2ML IJ SOLN
INTRAMUSCULAR | Status: AC
  Administered 2018-08-29: 50 ug via INTRAVENOUS
  Filled 2018-08-29: qty 2

## 2018-08-29 MED ORDER — 0.9 % SODIUM CHLORIDE (POUR BTL) OPTIME
TOPICAL | Status: DC | PRN
  Administered 2018-08-29: 2000 mL

## 2018-08-29 MED ORDER — GABAPENTIN 300 MG PO CAPS
300.0000 mg | ORAL_CAPSULE | Freq: Two times a day (BID) | ORAL | Status: DC
  Administered 2018-08-29: 300 mg via ORAL
  Filled 2018-08-29 (×5): qty 1

## 2018-08-29 MED ORDER — FENTANYL CITRATE (PF) 100 MCG/2ML IJ SOLN
INTRAMUSCULAR | Status: AC
  Filled 2018-08-29: qty 2

## 2018-08-29 MED ORDER — PROCHLORPERAZINE MALEATE 10 MG PO TABS: 10.0000 mg | ORAL_TABLET | Freq: Four times a day (QID) | ORAL | Status: DC | PRN

## 2018-08-29 MED ORDER — LACTATED RINGERS IV SOLN
INTRAVENOUS | Status: DC
  Administered 2018-08-29 (×2): via INTRAVENOUS

## 2018-08-29 MED ORDER — IBUPROFEN 200 MG PO TABS: 600.0000 mg | ORAL_TABLET | Freq: Every day | ORAL | Status: DC | PRN

## 2018-08-29 MED ORDER — POLYETHYLENE GLYCOL 3350 17 GM/SCOOP PO POWD
1.0000 | Freq: Once | ORAL | Status: DC
  Filled 2018-08-29: qty 255

## 2018-08-29 MED ORDER — SUGAMMADEX SODIUM 200 MG/2ML IV SOLN
INTRAVENOUS | Status: AC
  Filled 2018-08-29: qty 2

## 2018-08-29 MED ORDER — ENOXAPARIN SODIUM 40 MG/0.4ML ~~LOC~~ SOLN
40.0000 mg | Freq: Once | SUBCUTANEOUS | Status: AC
  Administered 2018-08-29: 40 mg via SUBCUTANEOUS
  Filled 2018-08-29: qty 0.4

## 2018-08-29 MED ORDER — ALPRAZOLAM 0.25 MG PO TABS: 0.1250 mg | ORAL_TABLET | Freq: Every evening | ORAL | Status: DC | PRN

## 2018-08-29 MED ORDER — OXYCODONE HCL 5 MG PO TABS: 5.0000 mg | ORAL_TABLET | Freq: Four times a day (QID) | ORAL | 0 refills | Status: DC | PRN

## 2018-08-29 MED ORDER — SODIUM CHLORIDE 0.9 % IV SOLN
INTRAVENOUS | Status: DC | PRN
  Administered 2018-08-29: 1000 mL via INTRAPERITONEAL

## 2018-08-29 MED ORDER — NEOMYCIN SULFATE 500 MG PO TABS
1000.0000 mg | ORAL_TABLET | ORAL | Status: DC
  Filled 2018-08-29: qty 2

## 2018-08-29 MED ORDER — OXYCODONE HCL 5 MG PO TABS
5.0000 mg | ORAL_TABLET | ORAL | Status: DC | PRN
  Administered 2018-08-29: 5 mg via ORAL
  Administered 2018-08-30: 10 mg via ORAL
  Administered 2018-08-30: 5 mg via ORAL
  Administered 2018-08-30 – 2018-08-31 (×4): 10 mg via ORAL
  Filled 2018-08-29: qty 2
  Filled 2018-08-29 (×2): qty 1
  Filled 2018-08-29 (×4): qty 2

## 2018-08-29 MED ORDER — METHOCARBAMOL 1000 MG/10ML IJ SOLN
1000.0000 mg | Freq: Four times a day (QID) | INTRAVENOUS | Status: DC | PRN
  Filled 2018-08-29: qty 10

## 2018-08-29 MED ORDER — METOPROLOL TARTRATE 5 MG/5ML IV SOLN: 5.0000 mg | Freq: Four times a day (QID) | INTRAVENOUS | Status: DC | PRN

## 2018-08-29 MED ORDER — LACTATED RINGERS IV SOLN
INTRAVENOUS | Status: DC
  Administered 2018-08-29: 18:00:00 via INTRAVENOUS

## 2018-08-29 MED ORDER — EPHEDRINE SULFATE-NACL 50-0.9 MG/10ML-% IV SOSY
PREFILLED_SYRINGE | INTRAVENOUS | Status: DC | PRN
  Administered 2018-08-29 (×3): 5 mg via INTRAVENOUS

## 2018-08-29 MED ORDER — ESMOLOL HCL 100 MG/10ML IV SOLN
INTRAVENOUS | Status: AC
  Filled 2018-08-29: qty 10

## 2018-08-29 MED ORDER — ACETAMINOPHEN 500 MG PO TABS
1000.0000 mg | ORAL_TABLET | ORAL | Status: AC
  Administered 2018-08-29: 1000 mg via ORAL
  Filled 2018-08-29: qty 2

## 2018-08-29 MED ORDER — ENSURE SURGERY PO LIQD
237.0000 mL | Freq: Two times a day (BID) | ORAL | Status: DC
  Administered 2018-08-30 (×2): 237 mL via ORAL
  Filled 2018-08-29 (×4): qty 237

## 2018-08-29 MED ORDER — ONDANSETRON HCL 4 MG/2ML IJ SOLN
INTRAMUSCULAR | Status: AC
  Administered 2018-08-29: 4 mg via INTRAVENOUS
  Filled 2018-08-29: qty 2

## 2018-08-29 MED ORDER — ONDANSETRON HCL 4 MG/2ML IJ SOLN
4.0000 mg | Freq: Once | INTRAMUSCULAR | Status: AC | PRN
  Administered 2018-08-29: 4 mg via INTRAVENOUS

## 2018-08-29 MED ORDER — HYDRALAZINE HCL 20 MG/ML IJ SOLN: 10.0000 mg | INTRAMUSCULAR | Status: DC | PRN

## 2018-08-29 MED ORDER — SCOPOLAMINE 1 MG/3DAYS TD PT72
MEDICATED_PATCH | TRANSDERMAL | Status: AC
  Filled 2018-08-29: qty 1

## 2018-08-29 MED ORDER — MIDAZOLAM HCL 2 MG/2ML IJ SOLN
INTRAMUSCULAR | Status: AC
  Filled 2018-08-29: qty 2

## 2018-08-29 MED ORDER — ONDANSETRON HCL 4 MG/2ML IJ SOLN: 4.0000 mg | Freq: Four times a day (QID) | INTRAMUSCULAR | Status: DC | PRN

## 2018-08-29 MED ORDER — ONDANSETRON HCL 4 MG PO TABS: 4.0000 mg | ORAL_TABLET | Freq: Four times a day (QID) | ORAL | Status: DC | PRN

## 2018-08-29 MED ORDER — PROPOFOL 10 MG/ML IV BOLUS
INTRAVENOUS | Status: AC
  Filled 2018-08-29: qty 20

## 2018-08-29 MED ORDER — ALVIMOPAN 12 MG PO CAPS
12.0000 mg | ORAL_CAPSULE | ORAL | Status: AC
  Administered 2018-08-29: 12 mg via ORAL
  Filled 2018-08-29: qty 1

## 2018-08-29 MED ORDER — NICOTINE 14 MG/24HR TD PT24: 14.0000 mg | MEDICATED_PATCH | TRANSDERMAL | Status: DC

## 2018-08-29 MED ORDER — SODIUM CHLORIDE 0.9 % IV SOLN: 1000.0000 mL | Freq: Three times a day (TID) | INTRAVENOUS | Status: DC | PRN

## 2018-08-29 MED ORDER — ENOXAPARIN SODIUM 40 MG/0.4ML ~~LOC~~ SOLN
40.0000 mg | SUBCUTANEOUS | Status: DC
  Administered 2018-08-30 – 2018-08-31 (×2): 40 mg via SUBCUTANEOUS
  Filled 2018-08-29 (×2): qty 0.4

## 2018-08-29 MED ORDER — PROMETHAZINE HCL 25 MG/ML IJ SOLN
6.2500 mg | Freq: Once | INTRAMUSCULAR | Status: AC
  Administered 2018-08-29: 6.25 mg via INTRAVENOUS

## 2018-08-29 MED ORDER — BISACODYL 5 MG PO TBEC
20.0000 mg | DELAYED_RELEASE_TABLET | Freq: Once | ORAL | Status: DC
  Filled 2018-08-29: qty 4

## 2018-08-29 MED ORDER — ALUM & MAG HYDROXIDE-SIMETH 200-200-20 MG/5ML PO SUSP: 30.0000 mL | Freq: Four times a day (QID) | ORAL | Status: DC | PRN

## 2018-08-29 MED ORDER — FENTANYL CITRATE (PF) 100 MCG/2ML IJ SOLN
INTRAMUSCULAR | Status: DC | PRN
  Administered 2018-08-29 (×2): 50 ug via INTRAVENOUS
  Administered 2018-08-29: 100 ug via INTRAVENOUS

## 2018-08-29 MED ORDER — GABAPENTIN 300 MG PO CAPS
300.0000 mg | ORAL_CAPSULE | ORAL | Status: AC
  Administered 2018-08-29: 300 mg via ORAL
  Filled 2018-08-29: qty 1

## 2018-08-29 MED ORDER — DIPHENHYDRAMINE HCL 12.5 MG/5ML PO ELIX: 12.5000 mg | ORAL_SOLUTION | Freq: Four times a day (QID) | ORAL | Status: DC | PRN

## 2018-08-29 MED ORDER — SACCHAROMYCES BOULARDII 250 MG PO CAPS
250.0000 mg | ORAL_CAPSULE | Freq: Two times a day (BID) | ORAL | Status: DC
  Administered 2018-08-29 – 2018-08-31 (×2): 250 mg via ORAL
  Filled 2018-08-29 (×5): qty 1

## 2018-08-29 MED ORDER — DIPHENHYDRAMINE HCL 50 MG/ML IJ SOLN: 12.5000 mg | Freq: Four times a day (QID) | INTRAMUSCULAR | Status: DC | PRN

## 2018-08-29 MED ORDER — PROPOFOL 10 MG/ML IV BOLUS
INTRAVENOUS | Status: DC | PRN
  Administered 2018-08-29: 160 mg via INTRAVENOUS

## 2018-08-29 SURGICAL SUPPLY — 105 items
APPLIER CLIP 5 13 M/L LIGAMAX5 (MISCELLANEOUS)
APPLIER CLIP ROT 10 11.4 M/L (STAPLE)
APR CLP MED LRG 11.4X10 (STAPLE)
APR CLP MED LRG 5 ANG JAW (MISCELLANEOUS)
BLADE EXTENDED COATED 6.5IN (ELECTRODE) ×4 IMPLANT
CANNULA REDUC XI 12-8 STAPL (CANNULA) ×1
CANNULA REDUC XI 12-8MM STAPL (CANNULA) ×1
CANNULA REDUCER 12-8 DVNC XI (CANNULA) ×2 IMPLANT
CELLS DAT CNTRL 66122 CELL SVR (MISCELLANEOUS) IMPLANT
CHLORAPREP W/TINT 26ML (MISCELLANEOUS) ×4 IMPLANT
CLIP APPLIE 5 13 M/L LIGAMAX5 (MISCELLANEOUS) IMPLANT
CLIP APPLIE ROT 10 11.4 M/L (STAPLE) IMPLANT
CLIP VESOLOCK LG 6/CT PURPLE (CLIP) IMPLANT
CLIP VESOLOCK MED LG 6/CT (CLIP) IMPLANT
COVER SURGICAL LIGHT HANDLE (MISCELLANEOUS) ×8 IMPLANT
COVER TIP SHEARS 8 DVNC (MISCELLANEOUS) ×2 IMPLANT
COVER TIP SHEARS 8MM DA VINCI (MISCELLANEOUS) ×2
DECANTER SPIKE VIAL GLASS SM (MISCELLANEOUS) ×7 IMPLANT
DEVICE TROCAR PUNCTURE CLOSURE (ENDOMECHANICALS) IMPLANT
DRAIN CHANNEL 19F RND (DRAIN) ×4 IMPLANT
DRAPE ARM DVNC X/XI (DISPOSABLE) ×8 IMPLANT
DRAPE COLUMN DVNC XI (DISPOSABLE) ×2 IMPLANT
DRAPE DA VINCI XI ARM (DISPOSABLE) ×8
DRAPE DA VINCI XI COLUMN (DISPOSABLE) ×2
DRAPE SURG IRRIG POUCH 19X23 (DRAPES) ×4 IMPLANT
DRSG OPSITE POSTOP 4X10 (GAUZE/BANDAGES/DRESSINGS) IMPLANT
DRSG OPSITE POSTOP 4X6 (GAUZE/BANDAGES/DRESSINGS) ×2 IMPLANT
DRSG OPSITE POSTOP 4X8 (GAUZE/BANDAGES/DRESSINGS) IMPLANT
DRSG TEGADERM 2-3/8X2-3/4 SM (GAUZE/BANDAGES/DRESSINGS) ×17 IMPLANT
DRSG TEGADERM 4X4.75 (GAUZE/BANDAGES/DRESSINGS) ×4 IMPLANT
ELECT PENCIL ROCKER SW 15FT (MISCELLANEOUS) ×4 IMPLANT
ELECT REM PT RETURN 15FT ADLT (MISCELLANEOUS) ×4 IMPLANT
ENDOLOOP SUT PDS II  0 18 (SUTURE)
ENDOLOOP SUT PDS II 0 18 (SUTURE) IMPLANT
EVACUATOR SILICONE 100CC (DRAIN) ×4 IMPLANT
GAUZE SPONGE 2X2 8PLY STRL LF (GAUZE/BANDAGES/DRESSINGS) ×2 IMPLANT
GAUZE SPONGE 4X4 12PLY STRL (GAUZE/BANDAGES/DRESSINGS) IMPLANT
GLOVE ECLIPSE 8.0 STRL XLNG CF (GLOVE) ×20 IMPLANT
GLOVE INDICATOR 8.0 STRL GRN (GLOVE) ×20 IMPLANT
GOWN STRL REUS W/TWL XL LVL3 (GOWN DISPOSABLE) ×20 IMPLANT
GRASPER SUT TROCAR 14GX15 (MISCELLANEOUS) ×4 IMPLANT
HOLDER FOLEY CATH W/STRAP (MISCELLANEOUS) ×4 IMPLANT
IRRIG SUCT STRYKERFLOW 2 WTIP (MISCELLANEOUS)
IRRIGATION SUCT STRKRFLW 2 WTP (MISCELLANEOUS) IMPLANT
IRRIGATOR SUCT 8 DISP DVNC XI (IRRIGATION / IRRIGATOR) IMPLANT
IRRIGATOR SUCTION 8MM XI DISP (IRRIGATION / IRRIGATOR)
LUBRICANT JELLY K Y 4OZ (MISCELLANEOUS) ×4 IMPLANT
NDL INSUFFLATION 14GA 120MM (NEEDLE) ×1 IMPLANT
NEEDLE INSUFFLATION 14GA 120MM (NEEDLE) ×4 IMPLANT
PACK CARDIOVASCULAR III (CUSTOM PROCEDURE TRAY) ×4 IMPLANT
PACK COLON (CUSTOM PROCEDURE TRAY) ×4 IMPLANT
PAD POSITIONING PINK XL (MISCELLANEOUS) ×4 IMPLANT
PORT LAP GEL ALEXIS MED 5-9CM (MISCELLANEOUS) ×4 IMPLANT
POSITIONER SURGICAL ARM (MISCELLANEOUS) ×8 IMPLANT
RELOAD STAPLE 45 BLU REG DVNC (STAPLE) IMPLANT
RELOAD STAPLE 45 GRN THCK DVNC (STAPLE) IMPLANT
RETRACTOR WND ALEXIS 18 MED (MISCELLANEOUS) IMPLANT
RTRCTR WOUND ALEXIS 18CM MED (MISCELLANEOUS)
SCISSORS LAP 5X35 DISP (ENDOMECHANICALS) ×4 IMPLANT
SCRUB TECHNI CARE SURGICAL (MISCELLANEOUS) ×2 IMPLANT
SEAL CANN UNIV 5-8 DVNC XI (MISCELLANEOUS) ×8 IMPLANT
SEAL XI 5MM-8MM UNIVERSAL (MISCELLANEOUS) ×8
SEALER VESSEL DA VINCI XI (MISCELLANEOUS) ×2
SEALER VESSEL EXT DVNC XI (MISCELLANEOUS) ×2 IMPLANT
SLEEVE ADV FIXATION 5X100MM (TROCAR) IMPLANT
SOLUTION ELECTROLUBE (MISCELLANEOUS) ×4 IMPLANT
SPONGE GAUZE 2X2 STER 10/PKG (GAUZE/BANDAGES/DRESSINGS) ×2
STAPLER 45 BLU RELOAD XI (STAPLE) IMPLANT
STAPLER 45 BLUE RELOAD XI (STAPLE)
STAPLER 45 GREEN RELOAD XI (STAPLE) ×2
STAPLER 45 GRN RELOAD XI (STAPLE) ×2 IMPLANT
STAPLER CANNULA SEAL DVNC XI (STAPLE) ×2 IMPLANT
STAPLER CANNULA SEAL XI (STAPLE) ×2
STAPLER CIRCULAR 29MM (STAPLE) ×2 IMPLANT
STAPLER SHEATH (SHEATH) ×2
STAPLER SHEATH ENDOWRIST DVNC (SHEATH) ×2 IMPLANT
SUT MNCRL AB 4-0 PS2 18 (SUTURE) ×4 IMPLANT
SUT PDS AB 1 CTX 36 (SUTURE) ×4 IMPLANT
SUT PDS AB 1 TP1 96 (SUTURE) IMPLANT
SUT PDS AB 2-0 CT2 27 (SUTURE) IMPLANT
SUT PROLENE 0 CT 2 (SUTURE) ×4 IMPLANT
SUT PROLENE 2 0 KS (SUTURE) IMPLANT
SUT PROLENE 2 0 SH DA (SUTURE) IMPLANT
SUT SILK 2 0 (SUTURE) ×4
SUT SILK 2 0 SH CR/8 (SUTURE) ×4 IMPLANT
SUT SILK 2-0 18XBRD TIE 12 (SUTURE) ×2 IMPLANT
SUT SILK 3 0 (SUTURE) ×4
SUT SILK 3 0 SH CR/8 (SUTURE) ×4 IMPLANT
SUT SILK 3-0 18XBRD TIE 12 (SUTURE) ×2 IMPLANT
SUT V-LOC BARB 180 2/0GR6 GS22 (SUTURE)
SUT VIC AB 3-0 SH 18 (SUTURE) ×4 IMPLANT
SUT VIC AB 3-0 SH 27 (SUTURE) ×4
SUT VIC AB 3-0 SH 27XBRD (SUTURE) ×2 IMPLANT
SUT VICRYL 0 UR6 27IN ABS (SUTURE) ×4 IMPLANT
SUTURE V-LC BRB 180 2/0GR6GS22 (SUTURE) IMPLANT
SYR 10ML LL (SYRINGE) ×4 IMPLANT
SYS LAPSCP GELPORT 120MM (MISCELLANEOUS)
SYSTEM LAPSCP GELPORT 120MM (MISCELLANEOUS) IMPLANT
TAPE UMBILICAL COTTON 1/8X30 (MISCELLANEOUS) ×4 IMPLANT
TOWEL OR NON WOVEN STRL DISP B (DISPOSABLE) ×4 IMPLANT
TRAY FOLEY CATH 14FR (SET/KITS/TRAYS/PACK) ×2 IMPLANT
TROCAR ADV FIXATION 5X100MM (TROCAR) ×4 IMPLANT
TUBING CONNECTING 10 (TUBING) ×6 IMPLANT
TUBING CONNECTING 10' (TUBING) ×2
TUBING INSUFFLATION 10FT LAP (TUBING) ×4 IMPLANT

## 2018-08-29 NOTE — H&P (Signed)
Christine Shah  DOB: June 26, 1966  Patient Care Team: Maryella Shivers, MD as PCP - General (Family Medicine) Sueanne Margarita, MD as PCP - Cardiology (Cardiology) Michael Boston, MD as Consulting Physician (General Surgery) Gatha Mayer, MD as Consulting Physician (Gastroenterology) Brand Males, MD as Consulting Physician (Pulmonary Disease)   Patient sent for surgical consultation at the request of DR [facility]  Chief Complaint: Recurrent diverticulitis ` ` The patient is a pleasant woman with cardiac and pulmonary issues. Smoker. Has had numerous attacks of sigmoid diverticulitis. She recalls at least 6 prior to this year. Had an episode of pain when he was admitted with a small abscess in her sigmoid colon. Improved with antibiotics. Surgical consultation requested. Recommended outpatient follow-up with gastroenterology and surgery to consider resection since she's had repeated attacks, the last one complicated. Since that time, she has needed a couple more rounds of Cipro/Flagyl. Just finished a course a couple weeks ago. She noted that when she took it with her BuSpar and backfired, but she can take it fine by itself. She usually moves her bowels about twice a week. Was recommended she stay on MiraLAX. That is help her bowels move every day. [facility]. They have seen her since discharge and they are planning elective colonoscopy to rule out any other potential source of her colon perforation. Apparently colonoscopy is planned in late September. She is worried about waiting that long. She is evaluated by pulmonary. 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 They're trying to get her to quit. She's been evaluated by cardiology as well. At some episodes of chest pain. Felt perhaps was more like costochondritis. Had a stress test done which showed no ischemia. Echocardiogram Phillip Heal do later today. Most likely will clear if that is normal as well. Also  encouraged her to quit smoking.  Patient claimed she had an umbilical hernia repair by Dr. York Spaniel with our group over 10 years ago. Tubal ligation before that. No other abdominal surgery. Colonoscopy by Dr. Earlean Shawl in 2011 showed a few hyperplastic polyps. Otherwise underwhelming. No personal nor family history of GI/colon cancer, inflammatory bowel disease, irritable bowel syndrome, allergy such as Celiac Sprue, dietary/dairy problems, colitis, ulcers nor gastritis. No recent sick contacts/gastroenteritis. No travel outside the country. No changes in diet. No dysphagia to solids or liquids. No significant heartburn or reflux. No hematochezia, hematemesis, coffee ground emesis. No evidence of prior gastric/peptic ulceration.  (Review of systems as stated in this history (HPI) or in the review of systems. Otherwise all other 12 point ROS are negative) ` ` `   Allergies Sabino Gasser; 07/01/2018 10:20 AM) Codeine Phosphate *ANALGESICS - OPIOID* Hydrocodone-Acetaminophen *ANALGESICS - OPIOID* TraMADol HCl *ANALGESICS - OPIOID* Allergies Reconciled  Medication History Sabino Gasser; 07/01/2018 10:21 AM) Xanax (0.5MG  Tablet, Oral) Active. Medications Reconciled    Vitals Sabino Gasser; 07/01/2018 10:22 AM) 07/01/2018 10:21 AM Weight: 209 lb Height: 67in Body Surface Area: 2.06 m Body Mass Index: 32.73 kg/m  Temp.: 70F(Oral)  Pulse: 97 (Regular)  BP: 126/80 (Sitting, Left Arm, Standard)   BP 114/73   Pulse 66   Temp 98.2 F (36.8 C) (Oral)   Ht 5\' 6"  (1.676 m)   Wt 93.9 kg   LMP 08/16/2018 Comment: 08/26/18, serum preg.negative  SpO2 96%   BMI 33.41 kg/m     Physical Exam Adin Hector MD; 07/01/2018 10:55 AM)  General Mental Status-Alert. General Appearance-Not in acute distress, Not Sickly. Orientation-Oriented X3. Hydration-Well hydrated. Voice-Normal.  Integumentary Global Assessment Upon inspection and  palpation of skin surfaces of the - Axillae: non-tender, no inflammation or ulceration, no drainage. and Distribution of scalp and body hair is normal. General Characteristics Temperature - normal warmth is noted.  Head and Neck Head-normocephalic, atraumatic with no lesions or palpable masses. Face Global Assessment - atraumatic, no absence of expression. Neck Global Assessment - no abnormal movements, no bruit auscultated on the right, no bruit auscultated on the left, no decreased range of motion, non-tender. Trachea-midline. Thyroid Gland Characteristics - non-tender.  Eye Eyeball - Left-Extraocular movements intact, No Nystagmus. Eyeball - Right-Extraocular movements intact, No Nystagmus. Cornea - Left-No Hazy. Cornea - Right-No Hazy. Sclera/Conjunctiva - Left-No scleral icterus, No Discharge. Sclera/Conjunctiva - Right-No scleral icterus, No Discharge. Pupil - Left-Direct reaction to light normal. Pupil - Right-Direct reaction to light normal.  ENMT Ears Pinna - Left - no drainage observed, no generalized tenderness observed. Right - no drainage observed, no generalized tenderness observed. Nose and Sinuses External Inspection of the Nose - no destructive lesion observed. Inspection of the nares - Left - quiet respiration. Right - quiet respiration. Mouth and Throat Lips - Upper Lip - no fissures observed, no pallor noted. Lower Lip - no fissures observed, no pallor noted. Nasopharynx - no discharge present. Oral Cavity/Oropharynx - Tongue - no dryness observed. Oral Mucosa - no cyanosis observed. Hypopharynx - no evidence of airway distress observed.  Chest and Lung Exam Inspection Movements - Normal and Symmetrical. Accessory muscles - No use of accessory muscles in breathing. Palpation Palpation of the chest reveals - Non-tender. Auscultation Breath sounds - Normal and Clear.  Cardiovascular Auscultation Rhythm - Regular. Murmurs & Other  Heart Sounds - Auscultation of the heart reveals - No Murmurs and No Systolic Clicks.  Abdomen Inspection Inspection of the abdomen reveals - No Visible peristalsis and No Abnormal pulsations. Umbilicus - No Bleeding, No Urine drainage. Palpation/Percussion Palpation and Percussion of the abdomen reveal - Soft, Non Tender, No Rebound tenderness, No Rigidity (guarding) and No Cutaneous hyperesthesia. Note: Abdomen soft. Overweight. Not distended. No distasis recti. No umbilical or other anterior abdominal wall hernias. Mild epigastric discomfort but no lower abdominal pain. No suprapubic pain.  Female Genitourinary Sexual Maturity Tanner 5 - Adult hair pattern. Note: No vaginal bleeding nor discharge  Peripheral Vascular Upper Extremity Inspection - Left - No Cyanotic nailbeds, Not Ischemic. Right - No Cyanotic nailbeds, Not Ischemic.  Neurologic Neurologic evaluation reveals -normal attention span and ability to concentrate, able to name objects and repeat phrases. Appropriate fund of knowledge , normal sensation and normal coordination. Mental Status Affect - not angry, not paranoid. Cranial Nerves-Normal Bilaterally. Gait-Normal.  Neuropsychiatric Mental status exam performed with findings of-able to articulate well with normal speech/language, rate, volume and coherence, thought content normal with ability to perform basic computations and apply abstract reasoning and no evidence of hallucinations, delusions, obsessions or homicidal/suicidal ideation.  Musculoskeletal Global Assessment Spine, Ribs and Pelvis - no instability, subluxation or laxity. Right Upper Extremity - no instability, subluxation or laxity.  Lymphatic Head & Neck  General Head & Neck Lymphatics: Bilateral - Description - No Localized lymphadenopathy. Axillary  General Axillary Region: Bilateral - Description - No Localized lymphadenopathy. Femoral & Inguinal  Generalized Femoral &  Inguinal Lymphatics: Left - Description - No Localized lymphadenopathy. Right - Description - No Localized lymphadenopathy.    Assessment & Plan Adin Hector MD; 07/01/2018 10:59 AM)  HISTORY OF DIVERTICULAR ABSCESS OF COLON (Z87.19) Impression: Small abscess noted on CT scan. His hears to be resolving  clinically.  Refill Cipro/Flagyl as needed until can have elective resection. She seems stable off the antibiotics.   DIVERTICULITIS OF LARGE INTESTINE WITHOUT PERFORATION OR ABSCESS WITHOUT BLEEDING (K57.32) Impression: At least 6 attacks of diverticulitis, the most recent one with documented abscess improved on IV antibiotics. Recurrent symptoms requiring 2 more rounds of Cipro since that hospitalization 6 weeks ago.  I think she is reaching end-stage diverticulitis and would benefit from sigmoid colectomy to get rid of the problem area.  Trying calm this down and do elective minimally invasive resection. Reasonable robotic colectomy.  The anatomy & physiology of the digestive tract was discussed.  The pathophysiology of the colon was discussed.  Natural history risks without surgery was discussed.   I feel the risks of no intervention will lead to serious problems that outweigh the operative risks; therefore, I recommended a partial colectomy to remove the pathology.  Minimally invasive (Robotic/Laparoscopic) & open techniques were discussed.   Risks such as bleeding, infection, abscess, leak, reoperation, injury to other organs, need for repair of tissues / organs, possible ostomy, hernia, heart attack, stroke, death, and other risks were discussed.  I noted a good likelihood this will help address the problem.   Goals of post-operative recovery were discussed as well.   Need for adequate nutrition, daily bowel regimen and healthy physical activity, to optimize recovery was noted as well. We will work to minimize complications.  Educational materials were available as well.   Questions were answered.  The patient expresses understanding & wishes to proceed with surgery.   I agree with gastroenterology that she would benefit from colonoscopy to rule out any endoluminal masses. Perhaps we could time and so that she gets it the day before surgery, thus getting just one bowel prep. She tells me that that is going to be in late September. See if we can move that up.  I strongly recommend she quit smoking to minimize perioperative complications.  Seems like she's been cleared by pulmonary.  She is cleared by cardiology per Dr. Radford Pax.   Colonoscopy with no other surprises.    Our office d/w her many times.  She has been nervous but understands that risks are minimized w elective resection   Adin Hector, MD, FACS, MASCRS Gastrointestinal and Minimally Invasive Surgery    [address] [place], [place] [address] [phone] Main / Paging [phone] Fax

## 2018-08-29 NOTE — Telephone Encounter (Signed)
No answer, left message to call back later today, B.Oak Dorey RN. 

## 2018-08-29 NOTE — Op Note (Addendum)
08/29/2018  4:04 PM  PATIENT:  Christine Shah  52 y.o. female  Patient Care Team: Maryella Shivers, MD as PCP - General (Family Medicine) Sueanne Margarita, MD as PCP - Cardiology (Cardiology) Michael Boston, MD as Consulting Physician (General Surgery) Gatha Mayer, MD as Consulting Physician (Gastroenterology) Brand Males, MD as Consulting Physician (Pulmonary Disease)  PRE-OPERATIVE DIAGNOSIS:  Diverticulitis  POST-OPERATIVE DIAGNOSIS:  Diverticulitis  PROCEDURE:  XI ROBOTIC ASSISTED LOW ANTERIOR RECTOSIGMOID COLON BILATERAL TAP BLOCKS RIGID PROCTOSCOPY  SURGEON:  Adin Hector, MD  ASSISTANT: Nadeen Landau, MD Ave Filter, PA-S, [facility] An experienced assistant was required given the standard of surgical care given the complexity of the case.  This assistant was needed for exposure, dissection, suctioning, retraction, resection, anastomosis, instrument exchange, etc.  ANESTHESIA:   local and general  Nerve block provided with liposomal bupivacaine (Experel) mixed with 0.25% bupivacaine as a Bilateral TAP block x30 mL at the level of the transverse abdominis & preperitoneal spaces along the flank at the anterior axillary line, from subcostal ridge to iliac crest under laparoscopic guidance    EBL:  Total I/O In: 1300 [I.V.:1300] Out: 350 [Urine:250; Blood:100]  Delay start of Pharmacological VTE agent (>24hrs) due to surgical blood loss or risk of bleeding:  no  DRAINS: none   SPECIMEN:  RECTOSIGMOID COLON with distal anastomotic ring  DISPOSITION OF SPECIMEN:  PATHOLOGY  COUNTS:  YES  PLAN OF CARE: Admit to inpatient   PATIENT DISPOSITION:  PACU - hemodynamically stable.  INDICATION:    Pleasant woman with recurrent attacks of sigmoid diverticulitis.  At least six in the past few years.  Most recent one severe enough with abscess requiring admission.   I recommended segmental resection:  The anatomy & physiology of the digestive tract  was discussed.  The pathophysiology was discussed.  Natural history risks without surgery was discussed.   I worked to give an overview of the disease and the frequent need to have multispecialty involvement.  I feel the risks of no intervention will lead to serious problems that outweigh the operative risks; therefore, I recommended a partial colectomy to remove the pathology.  Laparoscopic & open techniques were discussed.   Risks such as bleeding, infection, abscess, leak, reoperation, possible ostomy, hernia, heart attack, death, and other risks were discussed.  I noted a good likelihood this will help address the problem.   Goals of post-operative recovery were discussed as well.  We will work to minimize complications.  Educational materials on the pathology had been given in the office.  Questions were answered.    The patient expressed understanding & wished to proceed with surgery.  OR FINDINGS:   Patient had inflamed proximal sigmoid colon at the left pelvic brim.  This correlated with previous documentation of diverticulitis on CT scan.  Simple left ovarian cyst with some old blood within it consistent with recent diagnosis of hemorrhagic cyst.  Not tense.  No obvious endometriosis.  No obvious metastatic disease on visceral parietal peritoneum or liver.  The anastomosis rests 13 cm from the anal verge by rigid proctoscopy.  DESCRIPTION:   Informed consent was confirmed.  The patient underwent general anaesthesia without difficulty.  The patient was positioned appropriately.  VTE prevention in place.  The patient's abdomen was clipped, prepped, & draped in a sterile fashion.  Surgical timeout confirmed our plan.  The patient was positioned in reverse Trendelenburg.  Abdominal entry was gained using Varess technique on the anterior abdominal wall fascia in  the left subcostal ridge.  Entry was clean.  I induced carbon dioxide insufflation.  Camera inspection revealed no injury.  Extra ports  were carefully placed under direct laparoscopic visualization.  Patient had dense omental adhesions in the periumbilical region.  These were carefully freed off with focused cold scissors and occasional cautery.  Evidence of old mesh and prior hernia repair.  Some diverting in the peritoneum but no true complete hernia.  I reflected the greater omentum and the upper abdomen the small bowel in the upper abdomen.  Confirmed inflammation at the proximal/mid sigmoid colon the patient was carefully positioned.  Confirmed a simple left ovarian cyst with some old blood within it.  Consistent with recent diagnosis of hemorrhagic cyst.  It seemed to be softening and decompressing, so I do not feel the need to decompress it further.  Enlarged but not fibroid uterus.  The Intuitive daVinci robot was carefully docked with camera & instruments carefully placed.  The patient had adhesions of sigmoid colon to lateral pelvic wall.  Rectus segment somewhat twisted in a corkscrew fashion.  I did a little mobilization a lateral medial fashion to help straighten out the rectosigmoid colon.  I was able to elevate the rectosigmoid region to proceed with medial to lateral approach.  I scored the base of peritoneum of the medial side of the mesentery of the left colon from the ligament of Treitz to the peritoneal reflection of the mid rectum.   I elevated the sigmoid mesentery and entered into the retro-mesenteric plane. We were able to identify the left ureter and gonadal vessels. We kept those posterior within the retroperitoneum and elevated the left colon mesentery off that. I did isolate the inferior mesenteric artery (IMA) pedicle but did not ligate it yet.  I continued distally and got into the avascular plane posterior to the mesorectum. This allowed me to help mobilize the rectum as well by freeing the mesorectum off the sacrum.  I mobilized the peritoneal coverings towards the peritoneal reflection on both the right and left  sides of the rectum.  I stayed away from the right and left ureters.  I kept the lateral vascular pedicles to the rectum intact.  I created a window through the inferior mesenteric vein and artery.  Freed off the ureter and gonadal vessels on the upper left colon.  Did further mobilization and tied isolated the inferior mesenteric vein and inferior mesenteric artery pedicles.  I skeletonized the lymph nodes off the inferior mesenteric artery pedicle.  I went down to its takeoff from the aorta.  I isolated the inferior mesenteric vein off of the ligament of Treitz just cephalad to that as well.  After confirming the left ureter was out of the way, I went ahead and ligated the inferior mesenteric artery pedicle just near its takeoff from the aorta.  I did ligate the inferior mesenteric vein in a similar fashion.  We ensured hemostasis. I skeletonized the mesorectum at the junction at the proximal rectum for the distal point of resection.  I mobilized the left colon in a lateral to medial fashion off the line of Toldt up towards the splenic flexure to ensure good mobilization of the remaining left colon to reach into the pelvis.  Did further dissection the rectosigmoid to come to some proximal rectum that was not inflamed nor adherent.  I skeletonized at the proximal mesorectum and transected at the proximal rectum using a robotic 45 mm stapler through 12 mm suprapubic port..  Only 1  firing needed.  I chose a region at the descending/sigmoid junction that was soft and easily reached down to the rectal stump.  I transected the mesentery of the colon radially to preserve remaining colon blood supply.  I created an extraction incision through a small Pfannenstiel incision in the suprapubic region where the 12 mm staple port had been..  Placed a wound protector.  I was able to eviscerate the rectosigmoid and descending colon out the wound.   I clamped the colon proximal to this area using a reusable pursestringer  device.  Passed a 2-0 Keith needle. I transected at the descending/sigmoid junction with a scalpel. I got healthy bleeding mucosa.  We sent the rectosigmoid colon specimen off to go to pathology.  We sized the colon orifice.  I chose a 33 EEA anvil stapler system.  I reinforced the prolene pursestring with interrupted silk suture.  I placed the anvil to the open end of the proximal remaining colon and closed around it using the pursestring.    We did copious irrigation with crystalloid solution.  Hemostasis was good.  The distal end of the remaining colon easily reached down to the rectal stump, therefore, splenic flexure mobilization was not needed.      Dr Dema Severin scrubbed down and did gentle anal dilation and advanced the EEA stapler up the rectal stump. The spike was brought out at the provimal end of the rectal stump under direct visualization.  I attached the anvil of the proximal colon the spike of the stapler. Anvil was tightened down and held clamped for 60 seconds. The EEA stapler was fired and held clamped for 30 seconds. The stapler was released & removed. We noted 2 excellent anastomotic rings. Blue stitch is in the proximal ring.  Dr Dema Severin did rigid proctoscopy noted the anastomosis was at 13 cm from the anal verge consistent with the proximal rectum.  We did a final irrigation of antibiotic solution (900 mg clindamycin/240 mg gentamicin in a liter of crystalloid) & held that for the pelvic air leak test .  The rectum was insufflated the rectum while clamping the colon proximal to that anastomosis.  There was a negative air leak test. There was no tension of mesentery or bowel at the anastomosis.   Tissues looked viable.  Ureters & bowel uninjured.  The anastomosis looked healthy.  Endoluminal gas was evacuated.  Ports & wound protector removed.  We changed gloves & redraped the patient per colon SSI prevention protocol.  Sterile unused instruments were used from this point.   We aspirated the  antibiotic irrigation.  Hemostasis was good. I closed the skin at the port sites using Monocryl stitch and sterile dressing.  I closed the extraction wound using a 0 Vicryl vertical peritoneal closure and a #1 PDS transverse anterior rectal fascial closure like a small Pfannenstiel closure.  I closed the skin with some interrupted Monocryl stitches. I placed antibiotic-soaked wicks into the closure at the corners x2.  I placed sterile dressings.     Patient is being extubated go to recovery room. I had discussed postop care with the patient in detail the office & in the holding area. Instructions are written. I discussed operative findings, updated the patient's status, discussed probable steps to recovery, and gave postoperative recommendations to the patient's family.  Recommendations were made.  Questions were answered.  They expressed understanding & appreciation.   Adin Hector, M.D., F.A.C.S. Gastrointestinal and Minimally Invasive Surgery [facility] [address]  [address] [place], [place] [address] [phone] Main / Paging

## 2018-08-29 NOTE — Transfer of Care (Signed)
Immediate Anesthesia Transfer of Care Note  Patient: Christine Shah  Procedure(s) Performed: Procedure(s): XI ROBOTIC ASSISTED  SIGMOID COLECTOMY (N/A) RIGID PROCTOSCOPY (N/A)  Patient Location: PACU  Anesthesia Type:General  Level of Consciousness:  sedated, patient cooperative and responds to stimulation  Airway & Oxygen Therapy:Patient Spontanous Breathing and Patient connected to face mask oxgen  Post-op Assessment:  Report given to PACU RN and Post -op Vital signs reviewed and stable  Post vital signs:  Reviewed and stable  Last Vitals:  Vitals:   08/29/18 0939 08/29/18 1608  BP: 114/73 128/78  Pulse: 66 90  Resp:  18  Temp: 36.8 C   SpO2: 03% 353%    Complications: No apparent anesthesia complications

## 2018-08-29 NOTE — Telephone Encounter (Signed)
Second attempt, Left message for follow up from yesterday.

## 2018-08-29 NOTE — Anesthesia Preprocedure Evaluation (Signed)
Anesthesia Evaluation  Patient identified by MRN, date of birth, ID band Patient awake    Reviewed: Allergy & Precautions, NPO status , Patient's Chart, lab work & pertinent test results  Airway Mallampati: II  TM Distance: >3 FB Neck ROM: Full    Dental  (+) Dental Advisory Given   Pulmonary COPD, Current Smoker,    breath sounds clear to auscultation       Cardiovascular negative cardio ROS   Rhythm:Regular Rate:Normal     Neuro/Psych  Headaches,  Neuromuscular disease    GI/Hepatic Neg liver ROS, GERD  ,diverticulitis   Endo/Other  negative endocrine ROS  Renal/GU negative Renal ROS     Musculoskeletal   Abdominal   Peds  Hematology negative hematology ROS (+)   Anesthesia Other Findings   Reproductive/Obstetrics                             Lab Results  Component Value Date   WBC 10.4 08/14/2018   HGB 15.3 (H) 08/14/2018   HCT 44.5 08/14/2018   MCV 92.3 08/14/2018   PLT 209 08/14/2018   Lab Results  Component Value Date   CREATININE 0.62 08/14/2018   BUN 8 08/14/2018   NA 140 08/14/2018   K 4.0 08/14/2018   CL 109 08/14/2018   CO2 22 08/14/2018    Anesthesia Physical Anesthesia Plan  ASA: III  Anesthesia Plan: General   Post-op Pain Management:    Induction: Intravenous  PONV Risk Score and Plan: 3 and Ondansetron, Dexamethasone, Treatment may vary due to age or medical condition and Scopolamine patch - Pre-op  Airway Management Planned: Oral ETT  Additional Equipment:   Intra-op Plan:   Post-operative Plan: Extubation in OR  Informed Consent: I have reviewed the patients History and Physical, chart, labs and discussed the procedure including the risks, benefits and alternatives for the proposed anesthesia with the patient or authorized representative who has indicated his/her understanding and acceptance.   Dental advisory given  Plan Discussed with:  CRNA  Anesthesia Plan Comments:         Anesthesia Quick Evaluation

## 2018-08-29 NOTE — Discharge Instructions (Signed)
SURGERY: POST OP INSTRUCTIONS °(Surgery for small bowel obstruction, colon resection, etc) ° ° °###################################################################### ° °EAT °Gradually transition to a high fiber diet with a fiber supplement over the next few days after discharge ° °WALK °Walk an hour a day.  Control your pain to do that.   ° °CONTROL PAIN °Control pain so that you can walk, sleep, tolerate sneezing/coughing, go up/down stairs. ° °HAVE A BOWEL MOVEMENT DAILY °Keep your bowels regular to avoid problems.  OK to try a laxative to override constipation.  OK to use an antidairrheal to slow down diarrhea.  Call if not better after 2 tries ° °CALL IF YOU HAVE PROBLEMS/CONCERNS °Call if you are still struggling despite following these instructions. °Call if you have concerns not answered by these instructions ° °###################################################################### ° ° °DIET °Follow a light diet the first few days at home.  Start with a bland diet such as soups, liquids, starchy foods, low fat foods, etc.  If you feel full, bloated, or constipated, stay on a ful liquid or pureed/blenderized diet for a few days until you feel better and no longer constipated. °Be sure to drink plenty of fluids every day to avoid getting dehydrated (feeling dizzy, not urinating, etc.). °Gradually add a fiber supplement to your diet over the next week.  Gradually get back to a regular solid diet.  Avoid fast food or heavy meals the first week as you are more likely to get nauseated. °It is expected for your digestive tract to need a few months to get back to normal.  It is common for your bowel movements and stools to be irregular.  You will have occasional bloating and cramping that should eventually fade away.  Until you are eating solid food normally, off all pain medications, and back to regular activities; your bowels will not be normal. °Focus on eating a low-fat, high fiber diet the rest of your life  (See Getting to Good Bowel Health, below). ° °CARE of your INCISION or WOUND °It is good for closed incision and even open wounds to be washed every day.  Shower every day.  Short baths are fine.  Wash the incisions and wounds clean with soap & water.    °If you have a closed incision(s), wash the incision with soap & water every day.  You may leave closed incisions open to air if it is dry.   You may cover the incision with clean gauze & replace it after your daily shower for comfort. °If you have skin tapes (Steristrips) or skin glue (Dermabond) on your incision, leave them in place.  They will fall off on their own like a scab.  You may trim any edges that curl up with clean scissors.  If you have staples, set up an appointment for them to be removed in the office in 10 days after surgery.  °If you have a drain, wash around the skin exit site with soap & water and place a new dressing of gauze or band aid around the skin every day.  Keep the drain site clean & dry.    °If you have an open wound with packing, see wound care instructions.  In general, it is encouraged that you remove your dressing and packing, shower with soap & water, and replace your dressing once a day.  Pack the wound with clean gauze moistened with normal (0.9%) saline to keep the wound moist & uninfected.  Pressure on the dressing for 30 minutes will stop most wound   bleeding.  Eventually your body will heal & pull the open wound closed over the next few months.  °Raw open wounds will occasionally bleed or secrete yellow drainage until it heals closed.  Drain sites will drain a little until the drain is removed.  Even closed incisions can have mild bleeding or drainage the first few days until the skin edges scab over & seal.   °If you have an open wound with a wound vac, see wound vac care instructions. ° ° ° ° °ACTIVITIES as tolerated °Start light daily activities --- self-care, walking, climbing stairs-- beginning the day after surgery.   Gradually increase activities as tolerated.  Control your pain to be active.  Stop when you are tired.  Ideally, walk several times a day, eventually an hour a day.   °Most people are back to most day-to-day activities in a few weeks.  It takes 4-8 weeks to get back to unrestricted, intense activity. °If you can walk 30 minutes without difficulty, it is safe to try more intense activity such as jogging, treadmill, bicycling, low-impact aerobics, swimming, etc. °Save the most intensive and strenuous activity for last (Usually 4-8 weeks after surgery) such as sit-ups, heavy lifting, contact sports, etc.  Refrain from any intense heavy lifting or straining until you are off narcotics for pain control.  You will have off days, but things should improve week-by-week. °DO NOT PUSH THROUGH PAIN.  Let pain be your guide: If it hurts to do something, don't do it.  Pain is your body warning you to avoid that activity for another week until the pain goes down. °You may drive when you are no longer taking narcotic prescription pain medication, you can comfortably wear a seatbelt, and you can safely make sudden turns/stops to protect yourself without hesitating due to pain. °You may have sexual intercourse when it is comfortable. If it hurts to do something, stop. ° °MEDICATIONS °Take your usually prescribed home medications unless otherwise directed.   °Blood thinners:  °Usually you can restart any strong blood thinners after the second postoperative day.  It is OK to take aspirin right away.    ° If you are on strong blood thinners (warfarin/Coumadin, Plavix, Xerelto, Eliquis, Pradaxa, etc), discuss with your surgeon, medicine PCP, and/or cardiologist for instructions on when to restart the blood thinner & if blood monitoring is needed (PT/INR blood check, etc).   ° ° °PAIN CONTROL °Pain after surgery or related to activity is often due to strain/injury to muscle, tendon, nerves and/or incisions.  This pain is usually  short-term and will improve in a few months.  °To help speed the process of healing and to get back to regular activity more quickly, DO THE FOLLOWING THINGS TOGETHER: °1. Increase activity gradually.  DO NOT PUSH THROUGH PAIN °2. Use Ice and/or Heat °3. Try Gentle Massage and/or Stretching °4. Take over the counter pain medication °5. Take Narcotic prescription pain medication for more severe pain ° °Good pain control = faster recovery.  It is better to take more medicine to be more active than to stay in bed all day to avoid medications. °1.  Increase activity gradually °Avoid heavy lifting at first, then increase to lifting as tolerated over the next 6 weeks. °Do not “push through” the pain.  Listen to your body and avoid positions and maneuvers than reproduce the pain.  Wait a few days before trying something more intense °Walking an hour a day is encouraged to help your body recover faster   and more safely.  Start slowly and stop when getting sore.  If you can walk 30 minutes without stopping or pain, you can try more intense activity (running, jogging, aerobics, cycling, swimming, treadmill, sex, sports, weightlifting, etc.) °Remember: If it hurts to do it, then don’t do it! °2. Use Ice and/or Heat °You will have swelling and bruising around the incisions.  This will take several weeks to resolve. °Ice packs or heating pads (6-8 times a day, 30-60 minutes at a time) will help sooth soreness & bruising. °Some people prefer to use ice alone, heat alone, or alternate between ice & heat.  Experiment and see what works best for you.  Consider trying ice for the first few days to help decrease swelling and bruising; then, switch to heat to help relax sore spots and speed recovery. °Shower every day.  Short baths are fine.  It feels good!  Keep the incisions and wounds clean with soap & water.   °3. Try Gentle Massage and/or Stretching °Massage at the area of pain many times a day °Stop if you feel pain - do not  overdo it °4. Take over the counter pain medication °This helps the muscle and nerve tissues become less irritable and calm down faster °Choose ONE of the following over-the-counter anti-inflammatory medications: °Acetaminophen 500mg tabs (Tylenol) 1-2 pills with every meal and just before bedtime (avoid if you have liver problems or if you have acetaminophen in you narcotic prescription) °Naproxen 220mg tabs (ex. Aleve, Naprosyn) 1-2 pills twice a day (avoid if you have kidney, stomach, IBD, or bleeding problems) °Ibuprofen 200mg tabs (ex. Advil, Motrin) 3-4 pills with every meal and just before bedtime (avoid if you have kidney, stomach, IBD, or bleeding problems) °Take with food/snack several times a day as directed for at least 2 weeks to help keep pain / soreness down & more manageable. °5. Take Narcotic prescription pain medication for more severe pain °A prescription for strong pain control is often given to you upon discharge (for example: oxycodone/Percocet, hydrocodone/Norco/Vicodin, or tramadol/Ultram) °Take your pain medication as prescribed. °Be mindful that most narcotic prescriptions contain Tylenol (acetaminophen) as well - avoid taking too much Tylenol. °If you are having problems/concerns with the prescription medicine (does not control pain, nausea, vomiting, rash, itching, etc.), please call us [phone] to see if we need to switch you to a different pain medicine that will work better for you and/or control your side effects better. °If you need a refill on your pain medication, you must call the office before 4 pm and on weekdays only.  By federal law, prescriptions for narcotics cannot be called into a pharmacy.  They must be filled out on paper & picked up from our office by the patient or authorized caretaker.  Prescriptions cannot be filled after 4 pm nor on weekends.   ° °WHEN TO CALL US [phone] °Severe uncontrolled or worsening pain  °Fever over 101 F (38.5 C) °Concerns with  the incision: Worsening pain, redness, rash/hives, swelling, bleeding, or drainage °Reactions / problems with new medications (itching, rash, hives, nausea, etc.) °Nausea and/or vomiting °Difficulty urinating °Difficulty breathing °Worsening fatigue, dizziness, lightheadedness, blurred vision °Other concerns °If you are not getting better after two weeks or are noticing you are getting worse, contact our office [phone] for further advice.  We may need to adjust your medications, re-evaluate you in the office, send you to the emergency room, or see what other things we can do to help. °The   clinic staff is available to answer your questions during regular business hours (8:30am-5pm).  Please don’t hesitate to call and ask to speak to one of our nurses for clinical concerns.    °A surgeon from [facility] is always on call at the hospitals 24 hours/day °If you have a medical emergency, go to the nearest emergency room or call 911. ° °FOLLOW UP in our office °One the day of your discharge from the hospital (or the next business weekday), please call [facility] to set up or confirm an appointment to see your surgeon in the office for a follow-up appointment.  Usually it is 2-3 weeks after your surgery.   °If you have skin staples at your incision(s), let the office know so we can set up a time in the office for the nurse to remove them (usually around 10 days after surgery). °Make sure that you call for appointments the day of discharge (or the next business weekday) from the hospital to ensure a convenient appointment time. °IF YOU HAVE DISABILITY OR FAMILY LEAVE FORMS, BRING THEM TO THE OFFICE FOR PROCESSING.  DO NOT GIVE THEM TO YOUR DOCTOR. ° °[facility] °[address], [address], [place],   [address] ? °[phone] - Main °[phone] - Toll Free,  [phone] - Fax °[email or web address] ° °GETTING TO GOOD BOWEL HEALTH. °It is  expected for your digestive tract to need a few months to get back to normal.  It is common for your bowel movements and stools to be irregular.  You will have occasional bloating and cramping that should eventually fade away.  Until you are eating solid food normally, off all pain medications, and back to regular activities; your bowels will not be normal.   °Avoiding constipation °The goal: ONE SOFT BOWEL MOVEMENT A DAY!    °Drink plenty of fluids.  Choose water first. °TAKE A FIBER SUPPLEMENT EVERY DAY THE REST OF YOUR LIFE °During your first week back home, gradually add back a fiber supplement every day °Experiment which form you can tolerate.   There are many forms such as powders, tablets, wafers, gummies, etc °Psyllium bran (Metamucil), methylcellulose (Citrucel), Miralax or Glycolax, Benefiber, Flax Seed.  °Adjust the dose week-by-week (1/2 dose/day to 6 doses a day) until you are moving your bowels 1-2 times a day.  Cut back the dose or try a different fiber product if it is giving you problems such as diarrhea or bloating. °Sometimes a laxative is needed to help jump-start bowels if constipated until the fiber supplement can help regulate your bowels.  If you are tolerating eating & you are farting, it is okay to try a gentle laxative such as double dose MiraLax, prune juice, or Milk of Magnesia.  Avoid using laxatives too often. °Stool softeners can sometimes help counteract the constipating effects of narcotic pain medicines.  It can also cause diarrhea, so avoid using for too long. °If you are still constipated despite taking fiber daily, eating solids, and a few doses of laxatives, call our office. °Controlling diarrhea °Try drinking liquids and eating bland foods for a few days to avoid stressing your intestines further. °Avoid dairy products (especially milk & ice cream) for a short time.  The intestines often can lose the ability to digest lactose when stressed. °Avoid foods that cause gassiness or  bloating.  Typical foods include beans and other legumes, cabbage, broccoli, and dairy foods.  Avoid greasy, spicy, fast foods.  Every person has   some sensitivity to other foods, so listen to your body and avoid those foods that trigger problems for you. Probiotics (such as active yogurt, Align, etc) may help repopulate the intestines and colon with normal bacteria and calm down a sensitive digestive tract Adding a fiber supplement gradually can help thicken stools by absorbing excess fluid and retrain the intestines to act more normally.  Slowly increase the dose over a few weeks.  Too much fiber too soon can backfire and cause cramping & bloating. It is okay to try and slow down diarrhea with a few doses of antidiarrheal medicines.   Bismuth subsalicylate (ex. Kayopectate, Pepto Bismol) for a few doses can help control diarrhea.  Avoid if pregnant.   Loperamide (Imodium) can slow down diarrhea.  Start with one tablet (2mg ) first.  Avoid if you are having fevers or severe pain.  ILEOSTOMY PATIENTS WILL HAVE CHRONIC DIARRHEA since their colon is not in use.    Drink plenty of liquids.  You will need to drink even more glasses of water/liquid a day to avoid getting dehydrated. Record output from your ileostomy.  Expect to empty the bag every 3-4 hours at first.  Most people with a permanent ileostomy empty their bag 4-6 times at the least.   Use antidiarrheal medicine (especially Imodium) several times a day to avoid getting dehydrated.  Start with a dose at bedtime & breakfast.  Adjust up or down as needed.  Increase antidiarrheal medications as directed to avoid emptying the bag more than 8 times a day (every 3 hours). Work with your wound ostomy nurse to learn care for your ostomy.  See ostomy care instructions. TROUBLESHOOTING IRREGULAR BOWELS 1) Start with a soft & bland diet. No spicy, greasy, or fried foods.  2) Avoid gluten/wheat or dairy products from diet to see if symptoms improve. 3) Miralax  17gm or flax seed mixed in [place]. water or juice-daily. May use 2-4 times a day as needed. 4) Gas-X, Phazyme, etc. as needed for gas & bloating.  5) Prilosec (omeprazole) over-the-counter as needed 6)  Consider probiotics (Align, Activa, etc) to help calm the bowels down  Call your doctor if you are getting worse or not getting better.  Sometimes further testing (cultures, endoscopy, X-ray studies, CT scans, bloodwork, etc.) may be needed to help diagnose and treat the cause of the diarrhea. [facility], [place], [place], [place], [place]  [address] [phone] - Main.    [phone]  - Toll Free.   [phone] - Fax [email or web address]   Diverticulitis Diverticulitis is infection or inflammation of small pouches (diverticula) in the colon that form due to a condition called diverticulosis. Diverticula can trap stool (feces) and bacteria, causing infection and inflammation. Diverticulitis may cause severe stomach pain and diarrhea. It may lead to tissue damage in the colon that causes bleeding. The diverticula may also burst (rupture) and cause infected stool to enter other areas of the abdomen. Complications of diverticulitis can include:  Bleeding.  Severe infection.  Severe pain.  Rupture (perforation) of the colon.  Blockage (obstruction) of the colon.  What are the causes? This condition is caused by stool becoming trapped in the diverticula, which allows bacteria to grow in the diverticula. This leads to inflammation and infection. What increases the risk? You are more likely to develop this condition if:  You have diverticulosis. The risk for diverticulosis increases if: ? You are overweight or obese. ? You use tobacco products. ? You do  not get enough exercise.  You eat a diet that does not include enough fiber. High-fiber foods include fruits, vegetables, beans, nuts, and whole grains.  What are the signs or  symptoms? Symptoms of this condition may include:  Pain and tenderness in the abdomen. The pain is normally located on the left side of the abdomen, but it may occur in other areas.  Fever and chills.  Bloating.  Cramping.  Nausea.  Vomiting.  Changes in bowel routines.  Blood in your stool.  How is this diagnosed? This condition is diagnosed based on:  Your medical history.  A physical exam.  Tests to make sure there is nothing else causing your condition. These tests may include: ? Blood tests. ? Urine tests. ? Imaging tests of the abdomen, including X-rays, ultrasounds, MRIs, or CT scans.  How is this treated? Most cases of this condition are mild and can be treated at home. Treatment may include:  Taking over-the-counter pain medicines.  Following a clear liquid diet.  Taking antibiotic medicines by mouth.  Rest.  More severe cases may need to be treated at a hospital. Treatment may include:  Not eating or drinking.  Taking prescription pain medicine.  Receiving antibiotic medicines through an IV tube.  Receiving fluids and nutrition through an IV tube.  Surgery.  When your condition is under control, your health care provider may recommend that you have a colonoscopy. This is an exam to look at the entire large intestine. During the exam, a lubricated, bendable tube is inserted into the anus and then passed into the rectum, colon, and other parts of the large intestine. A colonoscopy can show how severe your diverticula are and whether something else may be causing your symptoms. Follow these instructions at home: Medicines  Take over-the-counter and prescription medicines only as told by your health care provider. These include fiber supplements, probiotics, and stool softeners.  If you were prescribed an antibiotic medicine, take it as told by your health care provider. Do not stop taking the antibiotic even if you start to feel better.  Do not  drive or use heavy machinery while taking prescription pain medicine. General instructions  Follow a full liquid diet or another diet as directed by your health care provider. After your symptoms improve, your health care provider may tell you to change your diet. He or she may recommend that you eat a diet that contains at least 25 g (25 grams) of fiber daily. Fiber makes it easier to pass stool. Healthy sources of fiber include: ? Berries. One cup contains 4-8 grams of fiber. ? Beans or lentils. One half cup contains 5-8 grams of fiber. ? Green vegetables. One cup contains 4 grams of fiber.  Exercise for at least 30 minutes, 3 times each week. You should exercise hard enough to raise your heart rate and break a sweat.  Keep all follow-up visits as told by your health care provider. This is important. You may need a colonoscopy. Contact a health care provider if:  Your pain does not improve.  You have a hard time drinking or eating food.  Your bowel movements do not return to normal. Get help right away if:  Your pain gets worse.  Your symptoms do not get better with treatment.  Your symptoms suddenly get worse.  You have a fever.  You vomit more than one time.  You have stools that are bloody, black, or tarry. Summary  Diverticulitis is infection or inflammation of small pouches (  diverticula) in the colon that form due to a condition called diverticulosis. Diverticula can trap stool (feces) and bacteria, causing infection and inflammation.  You are at higher risk for this condition if you have diverticulosis and you eat a diet that does not include enough fiber.  Most cases of this condition are mild and can be treated at home. More severe cases may need to be treated at a hospital.  When your condition is under control, your health care provider may recommend that you have an exam called a colonoscopy. This exam can show how severe your diverticula are and whether something  else may be causing your symptoms. This information is not intended to replace advice given to you by your health care provider. Make sure you discuss any questions you have with your health care provider. Document Released: 08/30/2005 Document Revised: 12/23/2016 Document Reviewed: 12/23/2016 Elsevier Interactive Patient Education  [employer].

## 2018-08-29 NOTE — Anesthesia Procedure Notes (Signed)
Procedure Name: Intubation Date/Time: 08/29/2018 1:45 PM Performed by: Lavina Hamman, CRNA Pre-anesthesia Checklist: Patient identified, Emergency Drugs available, Suction available, Patient being monitored and Timeout performed Patient Re-evaluated:Patient Re-evaluated prior to induction Oxygen Delivery Method: Circle system utilized Preoxygenation: Pre-oxygenation with 100% oxygen Induction Type: IV induction Ventilation: Mask ventilation without difficulty Laryngoscope Size: Mac and 4 Grade View: Grade I Tube type: Oral Tube size: 7.0 mm Number of attempts: 1 Airway Equipment and Method: Stylet Placement Confirmation: ETT inserted through vocal cords under direct vision,  positive ETCO2,  CO2 detector and breath sounds checked- equal and bilateral Secured at: 21 cm Tube secured with: Tape Dental Injury: Teeth and Oropharynx as per pre-operative assessment
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
## 2018-08-30 ENCOUNTER — Encounter (HOSPITAL_COMMUNITY): Payer: Self-pay | Admitting: Surgery

## 2018-08-30 LAB — BASIC METABOLIC PANEL
ANION GAP: 7 (ref 5–15)
BUN: 5 mg/dL — AB (ref 6–20)
CO2: 23 mmol/L (ref 22–32)
Calcium: 8 mg/dL — ABNORMAL LOW (ref 8.9–10.3)
Chloride: 110 mmol/L (ref 98–111)
Creatinine, Ser: 0.69 mg/dL (ref 0.44–1.00)
GFR calc Af Amer: >60 mL/min (ref >60)
GLUCOSE: 162 mg/dL — AB (ref 70–99)
POTASSIUM: 3.9 mmol/L (ref 3.5–5.1)
Sodium: 140 mmol/L (ref 135–145)

## 2018-08-30 LAB — CBC
HEMATOCRIT: 37.3 % (ref 36.0–46.0)
HEMOGLOBIN: 12.6 g/dL (ref 12.0–15.0)
MCH: 31.7 pg (ref 26.0–34.0)
MCHC: 33.8 g/dL (ref 30.0–36.0)
MCV: 94 fL (ref 78.0–100.0)
Platelets: 202 10*3/uL (ref 150–400)
RBC: 3.97 MIL/uL (ref 3.87–5.11)
RDW: 14.2 % (ref 11.5–15.5)
WBC: 9.4 10*3/uL (ref 4.0–10.5)

## 2018-08-30 LAB — MAGNESIUM: Magnesium: 1.8 mg/dL (ref 1.7–2.4)

## 2018-08-30 MED ORDER — ACETAMINOPHEN 500 MG PO TABS
1000.0000 mg | ORAL_TABLET | Freq: Three times a day (TID) | ORAL | Status: DC
  Administered 2018-08-30: 1000 mg via ORAL
  Filled 2018-08-30 (×3): qty 2

## 2018-08-30 NOTE — Progress Notes (Signed)
  Progress Note    Christine Shah [id number] 01/20/1966  CARE TEAM:  PCP: Hodges, Francisco, MD  Outpatient Care Team: Patient Care Team: Hodges, Francisco, MD as PCP - General (Family Medicine) Turner, Traci R, MD as PCP - Cardiology (Cardiology) Gross, Steven, MD as Consulting Physician (General Surgery) Gessner, Carl E, MD as Consulting Physician (Gastroenterology) Ramaswamy, Murali, MD as Consulting Physician (Pulmonary Disease)  Inpatient Treatment Team: Treatment Team: Attending Provider: Gross, Steven, MD; Technician: Bryant-Briggs, Belinda, NT   Problem List:   Principal Problem:   Recurrent sigmoid diverticulitis s/p LAR rectosigmoid resection 08/29/2018 Active Problems:   Anxiety disorder   GERD (gastroesophageal reflux disease)   Tobacco abuse   Obesity (BMI 30-39.9)   1 Day Post-Op  08/29/2018  Procedure(s): XI ROBOTIC ASSISTED  SIGMOID COLECTOMY RIGID PROCTOSCOPY  Hospital Stay = 1 days   Assessment:    - Patient is healing well so far, POD1  - S/p elective sigmoid colectomy for recurrent diverticulitis  Plan:    - Instructed patient to begin mobilization and start with liquid diet. - Continue Entereg until first BM. - Pain management with ice/heat pads and Gabapentin, advance to Oxycodone if not managed by Gabapentin. - Nausea control with Zofran. Second option use Compazine. Third option use Reglan. - Patient denied use of nicotine patch, leave order active in case patient needs. - O2 overnight, discontinued.   Family Communication/Anticipated D/C date and plan/Code Status   DVT prophylaxis: Lovenox and SCDs Code Status: Full code Family Communication: Discussed with patient, family not present at bedside Disposition Plan: Continue to monitor, reassess in 24 hr   Antimicrobials:    Anti-infectives (From admission, onward)   Start     Dose/Rate Route Frequency Ordered Stop   08/29/18 2200  cefoTEtan (CEFOTAN) 2 g in sodium chloride 0.9 %  100 mL IVPB     2 g 200 mL/hr over 30 Minutes Intravenous Every 12 hours 08/29/18 1746 08/29/18 2242   08/29/18 1551  clindamycin (CLEOCIN) 900 mg, gentamicin (GARAMYCIN) 240 mg in sodium chloride 0.9 % 1,000 mL for intraperitoneal lavage  Status:  Discontinued       As needed 08/29/18 1551 08/29/18 1604   08/29/18 1400  neomycin (MYCIFRADIN) tablet 1,000 mg  Status:  Discontinued     1,000 mg Oral 3 times per day 08/29/18 0918 08/29/18 1730   08/29/18 1400  metroNIDAZOLE (FLAGYL) tablet 1,000 mg  Status:  Discontinued     1,000 mg Oral 3 times per day 08/29/18 0918 08/29/18 1730   08/29/18 0930  cefoTEtan (CEFOTAN) 2 g in sodium chloride 0.9 % 100 mL IVPB     2 g 200 mL/hr over 30 Minutes Intravenous On call to O.R. 08/29/18 0918 08/29/18 1331   08/29/18 0600  clindamycin (CLEOCIN) 900 mg, gentamicin (GARAMYCIN) 240 mg in sodium chloride 0.9 % 1,000 mL for intraperitoneal lavage  Status:  Discontinued      Intraperitoneal To Surgery 08/28/18 1311 08/29/18 1730       Subjective:    Patient feels better today, noted feeling a little groggy and nauseated yesterday POD0. Notes wanting to get moving at this time. She was able to sit on the bed without difficulty. Mentioned she has had significant eructation at this stage but no flatus. Patient has urinated without trouble.   Objective:   Vitals:   08/29/18 1952 08/29/18 2102 08/30/18 0142 08/30/18 0552  BP: (!) 116/58 109/67 104/66 (!) 102/59  Pulse: (!) 57 (!) 54 (!) 48 (!)   52  Resp: _0 Temp: 97.6 F (36.4 C) 97.9 F (36.6 C) 98 F (36.7 C) 97.9 F (36.6 C)  TempSrc: Oral Oral Oral Oral  SpO2: 99% 100% 99% 98%  Weight:      Height:           Intake/Output   Yesterday:  09/26 0701 - 09/27 0700 In: 1966.7 [I.V.:1966.7] Out: 975 [Urine:875; Blood:100] This shift:  No intake/output data recorded.  Bowel function:  Flatus: No  BM:  No  Drain: (No drain)  Physical Exam:    General: Pt awake/alert/oriented x4  in no acute distress Skin: Incisions are clean with no purulent drainage. No rashes, lesions, ulcers. No induration Eyes: PERRL, lids and conjunctivae normal ENMT: Mucous membranes are moist. No oropharyngeal exudates Neck: normal, supple, no masses, no thyromegaly Cardiovascular: Regular rate and rhythm, no murmurs / rubs / gallops. No LE edema. 2+ pedal pulses. No carotid bruits.  Respiratory: clear to auscultation bilaterally, no wheezing, no crackles. Normal respiratory effort. No accessory muscle use.  Abdomen: Soft.  Nondistended.  Mildly tender at incisions only.  No evidence of peritonitis.  No incarcerated hernias. Musculoskeletal: no clubbing / cyanosis. No joint deformity upper and lower extremities. No contractures. Normal muscle tone.  Neurologic: CN 2-12 grossly intact no focal deficits. Psychiatric: Normal judgment and insight. Alert and oriented x 3. Normal mood.     Labs:   Results for orders placed or performed during the hospital encounter of 08/29/18 (from the past 48 hour(s))  Basic metabolic panel     Status: Abnormal   Collection Time: 08/30/18  3:40 AM  Result Value Ref Range   Sodium 140 135 - 145 mmol/L   Potassium 3.9 3.5 - 5.1 mmol/L   Chloride 110 98 - 111 mmol/L   CO2 23 22 - 32 mmol/L   Glucose, Bld 162 (H) 70 - 99 mg/dL   BUN 5 (L) 6 - 20 mg/dL   Creatinine, Ser 0.69 0.44 - 1.00 mg/dL   Calcium 8.0 (L) 8.9 - 10.3 mg/dL   GFR calc non Af Amer >60 >60 mL/min   GFR calc Af Amer >60 >60 mL/min    Comment: (NOTE) The eGFR has been calculated using the CKD EPI equation. This calculation has not been validated in all clinical situations. eGFR's persistently <60 mL/min signify possible Chronic Kidney Disease.    Anion gap 7 5 - 15    Comment: Performed at [facility], [place] [address] [place], [place] [address]  CBC     Status: None   Collection Time: 08/30/18  3:40 AM  Result Value Ref Range   WBC 9.4 4.0 - 10.5 K/uL   RBC 3.97  3.87 - 5.11 MIL/uL   Hemoglobin 12.6 12.0 - 15.0 g/dL   HCT 37.3 36.0 - 46.0 %   MCV 94.0 78.0 - 100.0 fL   MCH 31.7 26.0 - 34.0 pg   MCHC 33.8 30.0 - 36.0 g/dL   RDW 14.2 11.5 - 15.5 %   Platelets 202 150 - 400 K/uL    Comment: Performed at [facility], [place] [address] [place], [place] [address]  Magnesium     Status: None   Collection Time: 08/30/18  3:40 AM  Result Value Ref Range   Magnesium 1.8 1.7 - 2.4 mg/dL    Comment: Performed at [facility], [place] [address] [place], [place] [address]     Imaging / Studies: No results found.  Medication:    . alvimopan  12 mg Oral BID  . enoxaparin (LOVENOX) injection  40 mg Subcutaneous Q24H  . feeding supplement  237 mL Oral BID BM  . gabapentin  300 mg Oral BID  . nicotine  14 mg Transdermal Q24H  . saccharomyces boulardii  250 mg Oral BID    Continuous Infusions: . sodium chloride    . methocarbamol (ROBAXIN) IV      Time spent: 20 minutes  Signed, Deeyon Kamtarin, PA-S [employer] 2020 

## 2018-08-30 NOTE — Progress Notes (Signed)
ERAS education reinforced. Did patient attend class prior to procedure? Yes [ x  ] No [   ] Discussed: Pain Control [ x  ] Mobility [  x ] Diet [ x  ] Other [   ] Doing well. Educated Therapist, sports on [employer]. Encouraged patient to be out of bed for 6 hours today. Pecolia Ades, RN, BSN Quality Program Coordinator, Enhanced Recovery after Surgery 08/30/18 9:46 AM
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
## 2018-08-31 NOTE — Anesthesia Postprocedure Evaluation (Signed)
Anesthesia Post Note  Patient: Christine Shah  Procedure(s) Performed: XI ROBOTIC ASSISTED  SIGMOID COLECTOMY (N/A Abdomen) RIGID PROCTOSCOPY (N/A Rectum)     Patient location during evaluation: PACU Anesthesia Type: General Level of consciousness: awake and alert Pain management: pain level controlled Vital Signs Assessment: post-procedure vital signs reviewed and stable Respiratory status: spontaneous breathing, nonlabored ventilation, respiratory function stable and patient connected to nasal cannula oxygen Cardiovascular status: blood pressure returned to baseline and stable Postop Assessment: no apparent nausea or vomiting Anesthetic complications: no    Last Vitals:  Vitals:   08/30/18 2145 08/31/18 0542  BP: 98/66 108/75  Pulse: 60 64  Resp: 18 18  Temp: 36.7 C 36.7 C  SpO2: 98% 98%    Last Pain:  Vitals:   08/31/18 0542  TempSrc: Oral  PainSc:                  Tiajuana Amass

## 2018-08-31 NOTE — Plan of Care (Signed)
Reviewed discharge instructions with patient and copy given. IV removed. Patient ready for discharge.  

## 2018-09-10 ENCOUNTER — Encounter: Payer: Self-pay | Admitting: Internal Medicine

## 2018-09-10 NOTE — Progress Notes (Signed)
+   Barrett's esophagus EGD recall 3 years 2022 2 rectal hyperplastic polyps - colon recall 10 yrs 2029  My Chart letter

## 2018-09-27 ENCOUNTER — Emergency Department (HOSPITAL_COMMUNITY): Payer: Medicaid Other

## 2018-09-27 ENCOUNTER — Encounter (HOSPITAL_COMMUNITY): Payer: Self-pay | Admitting: Emergency Medicine

## 2018-09-27 ENCOUNTER — Telehealth: Payer: Self-pay | Admitting: Internal Medicine

## 2018-09-27 ENCOUNTER — Emergency Department (HOSPITAL_COMMUNITY)
Admission: EM | Admit: 2018-09-27 | Discharge: 2018-09-27 | Disposition: A | Discharge status: Home / Self Care | Payer: Medicaid Other | Attending: Emergency Medicine | Admitting: Emergency Medicine

## 2018-09-27 DIAGNOSIS — R197 Diarrhea, unspecified: Secondary | ICD-10-CM | POA: Diagnosis present

## 2018-09-27 DIAGNOSIS — J449 Chronic obstructive pulmonary disease, unspecified: Secondary | ICD-10-CM | POA: Insufficient documentation

## 2018-09-27 DIAGNOSIS — F17210 Nicotine dependence, cigarettes, uncomplicated: Secondary | ICD-10-CM | POA: Diagnosis not present

## 2018-09-27 DIAGNOSIS — Z8541 Personal history of malignant neoplasm of cervix uteri: Secondary | ICD-10-CM | POA: Diagnosis not present

## 2018-09-27 DIAGNOSIS — R1084 Generalized abdominal pain: Secondary | ICD-10-CM | POA: Insufficient documentation

## 2018-09-27 DIAGNOSIS — Z79899 Other long term (current) drug therapy: Secondary | ICD-10-CM | POA: Diagnosis not present

## 2018-09-27 DIAGNOSIS — R109 Unspecified abdominal pain: Secondary | ICD-10-CM

## 2018-09-27 LAB — CBC
HEMATOCRIT: 44.5 % (ref 36.0–46.0)
Hemoglobin: 15 g/dL (ref 12.0–15.0)
MCH: 31.8 pg (ref 26.0–34.0)
MCHC: 33.7 g/dL (ref 30.0–36.0)
MCV: 94.5 fL (ref 80.0–100.0)
Platelets: 183 10*3/uL (ref 150–400)
RBC: 4.71 MIL/uL (ref 3.87–5.11)
RDW: 13.3 % (ref 11.5–15.5)
WBC: 11.7 10*3/uL — AB (ref 4.0–10.5)
nRBC: 0 % (ref 0.0–0.2)

## 2018-09-27 LAB — COMPREHENSIVE METABOLIC PANEL
ALT: 12 U/L (ref 0–44)
AST: 18 U/L (ref 15–41)
Albumin: 4 g/dL (ref 3.5–5.0)
Alkaline Phosphatase: 62 U/L (ref 38–126)
Anion gap: 7 (ref 5–15)
BILIRUBIN TOTAL: 1.5 mg/dL — AB (ref 0.3–1.2)
BUN: 9 mg/dL (ref 6–20)
CHLORIDE: 106 mmol/L (ref 98–111)
CO2: 27 mmol/L (ref 22–32)
CREATININE: 0.72 mg/dL (ref 0.44–1.00)
Calcium: 8.8 mg/dL — ABNORMAL LOW (ref 8.9–10.3)
GFR calc Af Amer: >60 mL/min (ref >60)
GLUCOSE: 89 mg/dL (ref 70–99)
Potassium: 3.9 mmol/L (ref 3.5–5.1)
Sodium: 140 mmol/L (ref 135–145)
Total Protein: 6.6 g/dL (ref 6.5–8.1)

## 2018-09-27 LAB — I-STAT BETA HCG BLOOD, ED (MC, WL, AP ONLY)

## 2018-09-27 LAB — LIPASE, BLOOD: Lipase: 24 U/L (ref 11–51)

## 2018-09-27 LAB — C DIFFICILE QUICK SCREEN W PCR REFLEX
C DIFFICILE (CDIFF) INTERP: DETECTED
C Diff antigen: POSITIVE — AB
C Diff toxin: POSITIVE — AB

## 2018-09-27 MED ORDER — VANCOMYCIN 50 MG/ML ORAL SOLUTION: 125.0000 mg | Freq: Four times a day (QID) | ORAL | 0 refills | Status: AC

## 2018-09-27 MED ORDER — PROCHLORPERAZINE EDISYLATE 10 MG/2ML IJ SOLN
10.0000 mg | Freq: Once | INTRAMUSCULAR | Status: AC
  Administered 2018-09-27: 10 mg via INTRAVENOUS
  Filled 2018-09-27: qty 2

## 2018-09-27 MED ORDER — IOPAMIDOL (ISOVUE-300) INJECTION 61%
100.0000 mL | Freq: Once | INTRAVENOUS | Status: AC | PRN
  Administered 2018-09-27: 100 mL via INTRAVENOUS

## 2018-09-27 MED ORDER — SODIUM CHLORIDE 0.9 % IJ SOLN
INTRAMUSCULAR | Status: AC
  Filled 2018-09-27: qty 50

## 2018-09-27 MED ORDER — LACTATED RINGERS IV BOLUS
1000.0000 mL | Freq: Once | INTRAVENOUS | Status: AC
  Administered 2018-09-27: 1000 mL via INTRAVENOUS

## 2018-09-27 MED ORDER — IOPAMIDOL (ISOVUE-300) INJECTION 61%
INTRAVENOUS | Status: AC
  Filled 2018-09-27: qty 100

## 2018-09-27 NOTE — ED Notes (Signed)
Rx for Vancomycin oral solution called in for Dr. Ralene Bathe to [facility] [phone]. Patient called-no answer-message left on patient's voice mail that the Rx was called in and she needed to pick up the Rx by 2100.

## 2018-09-27 NOTE — ED Triage Notes (Signed)
Pt c/o diarrhea and nausea over the past couple days.

## 2018-09-27 NOTE — ED Notes (Signed)
[facility] declined to [facility]-called [facility] on [address] and will fill patient's Rx

## 2018-09-27 NOTE — ED Provider Notes (Signed)
Patient care assumed at 1600.  Pt here with nausea, diarrhea.  CT with proctocolitis.  Pt without significant abdominal tenderness.  Plan to d/c home with GI follow up, return precautions.    After patient's ED discharge her C diff study came back positive. Discussed with patient positive findings, contact precautions. Oral vancomycin, 125 mg four times daily for 10 days called in.   Quintella Reichert, MD 09/28/18 [phone]
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
## 2018-09-27 NOTE — Telephone Encounter (Signed)
IN ED with nausea no vomiting and mucous dc from rectum  A CT scan shows changes that could be proctocolitis  C diff pending  Labs ok  WBC 11.7  OK for dc per Dr. Ralene Bathe  She will f/u C diff and I will f/u with patient by Monday and she can call us back if needed before then

## 2018-10-03 ENCOUNTER — Encounter: Payer: Self-pay | Admitting: Nurse Practitioner

## 2018-10-03 ENCOUNTER — Ambulatory Visit: Payer: Medicaid Other | Admitting: Nurse Practitioner

## 2018-10-03 VITALS — BP 110/80 | HR 76 | Ht 66.0 in | Wt 198.1 lb

## 2018-10-03 DIAGNOSIS — A498 Other bacterial infections of unspecified site: Secondary | ICD-10-CM | POA: Diagnosis not present
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
## 2018-10-03 NOTE — Patient Instructions (Signed)
If you are age 50 or older, your body mass index should be between 23-30. Your Body mass index is 31.98 kg/m. If this is out of the aforementioned range listed, please consider follow up with your Primary Care Provider.  If you are age 22 or younger, your body mass index should be between 19-25. Your Body mass index is 31.98 kg/m. If this is out of the aformentioned range listed, please consider follow up with your Primary Care Provider.   Complete Vancomycin.  Start Florastor twice daily (over-the-counter) - Continue for 30 days after completion of antibiotic.  Thank you for choosing me and [facility].   Tye Savoy, NP

## 2018-10-22 ENCOUNTER — Other Ambulatory Visit: Payer: Self-pay

## 2018-10-22 ENCOUNTER — Telehealth: Payer: Self-pay | Admitting: Gastroenterology

## 2018-10-22 MED ORDER — VANCOMYCIN HCL 125 MG PO CAPS: 125.0000 mg | ORAL_CAPSULE | Freq: Four times a day (QID) | ORAL | 0 refills | Status: AC

## 2018-10-22 NOTE — Telephone Encounter (Signed)
Pt called stating that c-diff is back and she would like to see a PA today. Pls call her.

## 2018-10-22 NOTE — Telephone Encounter (Signed)
Sounds like C diff again  1) vancomycin 125 mg 4 times a day x 14 days please 2) Have her contact us with an update on Friday - sooner if needed - it will take a few days at least to start to fell somewhat better 3) If she gets worse instead of better call back or if in severe distress go to ED 4) When she calls back will determine follow-up timing

## 2018-10-22 NOTE — Telephone Encounter (Signed)
Christine Shah, please advise. Thanks

## 2018-10-22 NOTE — Telephone Encounter (Signed)
Dr Carlean Purl would you advise on this please?

## 2018-10-22 NOTE — Telephone Encounter (Signed)
Pt call again

## 2018-10-22 NOTE — Telephone Encounter (Signed)
Discussed in detail with the patient. Expresses understanding of this plan. She asks to use [facility] at [address]. Confirmed the Vancomycin is in stock. Discussed hydration with the patient.  She will call back on Friday with an update of her progress.

## 2018-10-22 NOTE — Telephone Encounter (Signed)
Oncall Note:  Received message from answering service that patient is having recurrent stomach pain and diarrhea after completion of antibiotics. Called back, did not reach patient, left message. She was recently seen by Nevin Bloodgood 2 weeks ago in office for C. difficile colitis and also has history of recurrent diverticulitis and was hospitalized in September 2019. Please call patient and check how she is doing.

## 2018-10-22 NOTE — Telephone Encounter (Signed)
3 days of her stomach hurting. Yesterday she began having watery diarrhea. She has had 3 watery stools today. T99.5 She feels slightly nauseous and not hungry. "Feels just like it did last time."

## 2018-10-22 NOTE — Telephone Encounter (Signed)
I have already called the patient and sent a note to [employer].

## 2018-10-25 ENCOUNTER — Telehealth: Payer: Self-pay | Admitting: Gastroenterology

## 2018-10-25 ENCOUNTER — Telehealth: Payer: Self-pay | Admitting: Internal Medicine

## 2018-10-25 NOTE — Telephone Encounter (Signed)
Patient calls to tell us she had 3 diarrhea stools yesterday. She has not had any bowel movements today. Her stomach feels swollen and heavy  "all over." She is also questioning the treatment. States she is maintaining hydration and she is able to eat.  Her "case manager" told her there is another medication called Flagyl that she could have been on. Explained recommended treatments, etc. She does not seem satisfied with my answer and again questions this. I have explained as best I can inviting her questions. She says she understands.  Please advise on the swollen and heavy feeling in her abdomen.

## 2018-10-25 NOTE — Telephone Encounter (Signed)
Error
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
## 2018-10-28 ENCOUNTER — Other Ambulatory Visit: Payer: Self-pay

## 2018-10-28 ENCOUNTER — Encounter: Payer: Self-pay | Admitting: Pulmonary Disease

## 2018-10-28 ENCOUNTER — Ambulatory Visit: Payer: Medicaid Other | Admitting: Pulmonary Disease

## 2018-10-28 ENCOUNTER — Ambulatory Visit (HOSPITAL_COMMUNITY)
Admission: EM | Admit: 2018-10-28 | Discharge: 2018-10-28 | Disposition: A | Discharge status: Home / Self Care | Payer: Medicaid Other | Attending: Family Medicine | Admitting: Family Medicine

## 2018-10-28 ENCOUNTER — Encounter (HOSPITAL_COMMUNITY): Payer: Self-pay

## 2018-10-28 VITALS — BP 126/84 | HR 87 | Ht 66.0 in | Wt 193.0 lb

## 2018-10-28 DIAGNOSIS — F17210 Nicotine dependence, cigarettes, uncomplicated: Secondary | ICD-10-CM

## 2018-10-28 DIAGNOSIS — L02211 Cutaneous abscess of abdominal wall: Secondary | ICD-10-CM | POA: Diagnosis not present

## 2018-10-28 DIAGNOSIS — J449 Chronic obstructive pulmonary disease, unspecified: Secondary | ICD-10-CM

## 2018-10-28 DIAGNOSIS — Z720 Tobacco use: Secondary | ICD-10-CM

## 2018-10-28 MED ORDER — DOXYCYCLINE HYCLATE 100 MG PO CAPS: 100.0000 mg | ORAL_CAPSULE | Freq: Two times a day (BID) | ORAL | 0 refills | Status: DC

## 2018-10-28 MED ORDER — LIDOCAINE HCL (PF) 2 % IJ SOLN
INTRAMUSCULAR | Status: AC
  Filled 2018-10-28: qty 2

## 2018-10-28 NOTE — Assessment & Plan Note (Signed)
  We recommend that you stop smoking.  >>>You need to set a quit date >>>If you have friends or family who smoke, let them know you are trying to quit and not to smoke around you or in your living environment  Smoking Cessation Resources:  1 800 QUIT NOW  >>> Patient to call this resource and utilize it to help support her quit smoking >>> Keep up your hard work with stopping smoking  You can also contact the [facility] >>>For smoking cessation classes call [phone]  We do not recommend using e-cigarettes as a form of stopping smoking  You can sign up for smoking cessation support texts and information:  >>>https://smokefree.gov/smokefreetxt   Nicotine patches: >>>Make sure you rotate sites that you do not get skin irritation, Apply 1 patch each morning to a non-hairy skin site  If you are smoking greater than 10 cigarettes/day and weigh over 45 kg start with the nicotine patch of 21 mg a day for 6 weeks, then 14 mg a day for 2 weeks, then finished with 7 mg a day for 2 weeks, then stop  If you are smoking less than 10 cigarettes a day or weight less than 45 kg start with medium dose pack of 14 mg a day for 6 weeks, followed by 7 mg a day for 2 weeks   >>>If insomnia occurs you are having trouble sleeping you can take the patch off at night, and place a new one on in the morning >>>If the patch is removed at night and you have morning cravings start short acting nicotine replacement therapy such as gum or lozenges  Nicotine Gum:  >>>Smokers who smoke 25 or more cigarettes a day should use 4 mg dose >>>Smokers who smoke fewer than 25 cigarettes a day should use 2 mg dose  Proper chewing of gum is important for optimal results.   >>>Do not chew gum to rapidly.  Once you start chewing eating tasty peppery taste and slide the gum to your cheek.  When the taste disappears to a few more times.  Repeat this for 30 minutes.  Then discard the gum.  >>>Avoid acidic  beverages such as coffee, carbonated beverages before and during gum use.  A soft acidic beverages lower oral pH which cause nicotine to not be absorbed properly >>>If you chew the gum too quickly or vigorously you could have nausea, vomiting, abdominal pain, constipation, hiccups, headache, sore jaw, mouth irritation ulcers  Nicotine lozenge: Lozenges are commonly uses short acting NRT product  >>>Smokers who smoke within 30 minutes of awakening should use 4 mg dose >>>Smokers who wait more than 30 minutes after awakening to smoke should use 2 mg dose  Can use up to 1 lozenge every 1-2 hours for 6 weeks >>>Total amount of lozenges that can be used per day as 20 >>>Gradually reduce number of lozenges used per day after 2 weeks of use  Place lozenge in mouth and allowed to dissolve for 30 minutes loss and does not need to be chewed  Lozenges have advantages to be able to be used in people with TMG, poor dentition, dentures   Follow up in 3 months with our office

## 2018-10-28 NOTE — Telephone Encounter (Signed)
Please get an update from her - sorry I did not catch up Friday
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
## 2018-10-28 NOTE — Patient Instructions (Addendum)
Keep follow-up with GI  Continue with vancomycin regimen  We recommend that you stop smoking.  >>>You need to set a quit date >>>If you have friends or family who smoke, let them know you are trying to quit and not to smoke around you or in your living environment  Smoking Cessation Resources:  1 800 QUIT NOW  >>> Patient to call this resource and utilize it to help support her quit smoking >>> Keep up your hard work with stopping smoking  You can also contact the [facility] >>>For smoking cessation classes call [phone]  We do not recommend using e-cigarettes as a form of stopping smoking  You can sign up for smoking cessation support texts and information:  >>>https://smokefree.gov/smokefreetxt   Nicotine patches: >>>Make sure you rotate sites that you do not get skin irritation, Apply 1 patch each morning to a non-hairy skin site  If you are smoking greater than 10 cigarettes/day and weigh over 45 kg start with the nicotine patch of 21 mg a day for 6 weeks, then 14 mg a day for 2 weeks, then finished with 7 mg a day for 2 weeks, then stop  If you are smoking less than 10 cigarettes a day or weight less than 45 kg start with medium dose pack of 14 mg a day for 6 weeks, followed by 7 mg a day for 2 weeks   >>>If insomnia occurs you are having trouble sleeping you can take the patch off at night, and place a new one on in the morning >>>If the patch is removed at night and you have morning cravings start short acting nicotine replacement therapy such as gum or lozenges  Nicotine Gum:  >>>Smokers who smoke 25 or more cigarettes a day should use 4 mg dose >>>Smokers who smoke fewer than 25 cigarettes a day should use 2 mg dose  Proper chewing of gum is important for optimal results.   >>>Do not chew gum to rapidly.  Once you start chewing eating tasty peppery taste and slide the gum to your cheek.  When the taste disappears to a few more times.  Repeat this for  30 minutes.  Then discard the gum.  >>>Avoid acidic beverages such as coffee, carbonated beverages before and during gum use.  A soft acidic beverages lower oral pH which cause nicotine to not be absorbed properly >>>If you chew the gum too quickly or vigorously you could have nausea, vomiting, abdominal pain, constipation, hiccups, headache, sore jaw, mouth irritation ulcers  Nicotine lozenge: Lozenges are commonly uses short acting NRT product  >>>Smokers who smoke within 30 minutes of awakening should use 4 mg dose >>>Smokers who wait more than 30 minutes after awakening to smoke should use 2 mg dose  Can use up to 1 lozenge every 1-2 hours for 6 weeks >>>Total amount of lozenges that can be used per day as 20 >>>Gradually reduce number of lozenges used per day after 2 weeks of use  Place lozenge in mouth and allowed to dissolve for 30 minutes loss and does not need to be chewed  Lozenges have advantages to be able to be used in people with TMG, poor dentition, dentures    Follow up in 3 months with our office    It is flu season:   >>>Remember to be washing your hands regularly, using hand sanitizer, be careful to use around herself with has contact with people who are sick will increase her chances of getting sick yourself. >>>  Best ways to protect herself from the flu: Receive the yearly flu vaccine, practice good hand hygiene washing with soap and also using hand sanitizer when available, eat a nutritious meals, get adequate rest, hydrate appropriately   Please contact the office if your symptoms worsen or you have concerns that you are not improving.   Thank you for choosing [facility] for your healthcare, and for allowing [place] to partner with you on your healthcare journey. I am thankful to be able to provide care to you today.   [name]

## 2018-10-28 NOTE — ED Triage Notes (Signed)
Pt cc a  boil area her vaginal area x 1 week.

## 2018-10-28 NOTE — Discharge Instructions (Signed)
Declines incision and drainage today Apply warm compresses 3-4x daily for 10-15 minutes Wash site daily with warm water and mild soap Keep covered to avoid friction Apply topical antibacterial ointment twice daily Hard copy of doxycycline prescribed.  Fill and begin taking if symptoms do not improve in the next 24-48 hours.  You may call your GI doctor to ensure this is okay with your current treatment regimen for c. Diff.   Return or follow up with PCP if symptoms persists Return or go to the ED if you have any new or worsening symptoms such as increased redness, swelling, pain, fever, chills, nausea, vomiting, abdominal pain, etc..Marland Kitchen

## 2018-10-28 NOTE — Telephone Encounter (Signed)
Spoke with pt and she states that the diarrhea and pain is gone. However over the weekend she developed this "boil" on her private parts. Went to urgent care and states they did not lance it as there was no head on it, wrote her a script for dicyclomine 100mg  BID not sure for how many days. Pt wants to know if Dr. Carlean Purl feels it is ok for her to take this. Please advise.

## 2018-10-28 NOTE — Assessment & Plan Note (Signed)
Note your daily symptoms > remember "red flags" for COPD:   >>>Increase in cough >>>increase in sputum production >>>increase in shortness of breath or activity  intolerance.   If you notice these symptoms, please call the office to be seen.   We recommend that you stop smoking.  >>>You need to set a quit date >>>If you have friends or family who smoke, let them know you are trying to quit and not to smoke around you or in your living environment  Smoking Cessation Resources:  1 800 QUIT NOW  >>> Patient to call this resource and utilize it to help support her quit smoking >>> Keep up your hard work with stopping smoking  You can also contact the [facility] >>>For smoking cessation classes call [phone]  We do not recommend using e-cigarettes as a form of stopping smoking  You can sign up for smoking cessation support texts and information:  >>>https://smokefree.gov/smokefreetxt   Nicotine patches: >>>Make sure you rotate sites that you do not get skin irritation, Apply 1 patch each morning to a non-hairy skin site  If you are smoking greater than 10 cigarettes/day and weigh over 45 kg start with the nicotine patch of 21 mg a day for 6 weeks, then 14 mg a day for 2 weeks, then finished with 7 mg a day for 2 weeks, then stop  If you are smoking less than 10 cigarettes a day or weight less than 45 kg start with medium dose pack of 14 mg a day for 6 weeks, followed by 7 mg a day for 2 weeks   >>>If insomnia occurs you are having trouble sleeping you can take the patch off at night, and place a new one on in the morning >>>If the patch is removed at night and you have morning cravings start short acting nicotine replacement therapy such as gum or lozenges  Nicotine Gum:  >>>Smokers who smoke 25 or more cigarettes a day should use 4 mg dose >>>Smokers who smoke fewer than 25 cigarettes a day should use 2 mg dose  Proper chewing of gum is important for optimal  results.   >>>Do not chew gum to rapidly.  Once you start chewing eating tasty peppery taste and slide the gum to your cheek.  When the taste disappears to a few more times.  Repeat this for 30 minutes.  Then discard the gum.  >>>Avoid acidic beverages such as coffee, carbonated beverages before and during gum use.  A soft acidic beverages lower oral pH which cause nicotine to not be absorbed properly >>>If you chew the gum too quickly or vigorously you could have nausea, vomiting, abdominal pain, constipation, hiccups, headache, sore jaw, mouth irritation ulcers  Nicotine lozenge: Lozenges are commonly uses short acting NRT product  >>>Smokers who smoke within 30 minutes of awakening should use 4 mg dose >>>Smokers who wait more than 30 minutes after awakening to smoke should use 2 mg dose  Can use up to 1 lozenge every 1-2 hours for 6 weeks >>>Total amount of lozenges that can be used per day as 20 >>>Gradually reduce number of lozenges used per day after 2 weeks of use  Place lozenge in mouth and allowed to dissolve for 30 minutes loss and does not need to be chewed  Lozenges have advantages to be able to be used in people with TMG, poor dentition, dentures  Follow up in 3 months with our office

## 2018-10-29 ENCOUNTER — Ambulatory Visit: Payer: Medicaid Other | Admitting: Nurse Practitioner

## 2018-10-29 ENCOUNTER — Other Ambulatory Visit: Payer: Self-pay | Admitting: Nurse Practitioner

## 2018-10-29 ENCOUNTER — Encounter: Payer: Self-pay | Admitting: Nurse Practitioner

## 2018-10-29 VITALS — BP 106/72 | HR 96 | Ht 66.0 in | Wt 192.5 lb

## 2018-10-29 DIAGNOSIS — L03818 Cellulitis of other sites: Secondary | ICD-10-CM | POA: Diagnosis not present

## 2018-10-29 DIAGNOSIS — A0472 Enterocolitis due to Clostridium difficile, not specified as recurrent: Secondary | ICD-10-CM | POA: Diagnosis not present

## 2018-10-29 MED ORDER — MUPIROCIN CALCIUM 2 % EX CREA: 1.0000 "application " | TOPICAL_CREAM | Freq: Two times a day (BID) | CUTANEOUS | 0 refills | Status: DC
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
## 2018-10-29 NOTE — Patient Instructions (Addendum)
If you are age 50 or older, your body mass index should be between 23-30. Your Body mass index is 31.07 kg/m. If this is out of the aforementioned range listed, please consider follow up with your Primary Care Provider.  If you are age 23 or younger, your body mass index should be between 19-25. Your Body mass index is 31.07 kg/m. If this is out of the aformentioned range listed, please consider follow up with your Primary Care Provider.   We have sent the following medications to your pharmacy for you to pick up at your convenience: Mupirocin cream -apply three times daily for 10 days.  Continue Florastor twice daily for 2 months.  Call if no improvement.  Thank you for choosing me and [facility].   Tye Savoy, NP

## 2018-10-29 NOTE — Telephone Encounter (Signed)
I do not think she should take the doxycycline - yet  See if she is willing to see Jess today or tomorrow and set that up if so we can look at this and determine if Abx really worth it   Let me know what is set up and I will communicate with Jess

## 2018-10-29 NOTE — Telephone Encounter (Signed)
Pt aware and scheduled to see Tye Savoy NP today at 2pm as Alonza Bogus PA is not in today.

## 2018-10-30 ENCOUNTER — Other Ambulatory Visit: Payer: Self-pay

## 2018-10-30 ENCOUNTER — Telehealth: Payer: Self-pay | Admitting: Nurse Practitioner

## 2018-10-30 MED ORDER — MUPIROCIN 2 % EX OINT: 1.0000 "application " | TOPICAL_OINTMENT | Freq: Three times a day (TID) | CUTANEOUS | 2 refills | Status: DC

## 2018-11-08 ENCOUNTER — Ambulatory Visit: Payer: Medicaid Other | Admitting: Internal Medicine
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
## 2019-01-28 ENCOUNTER — Encounter: Payer: Self-pay | Admitting: Pulmonary Disease

## 2019-01-28 ENCOUNTER — Ambulatory Visit (INDEPENDENT_AMBULATORY_CARE_PROVIDER_SITE_OTHER): Payer: Medicaid Other | Admitting: Pulmonary Disease

## 2019-01-28 VITALS — BP 104/62 | HR 78 | Ht 66.0 in | Wt 204.8 lb

## 2019-01-28 DIAGNOSIS — E669 Obesity, unspecified: Secondary | ICD-10-CM

## 2019-01-28 DIAGNOSIS — J449 Chronic obstructive pulmonary disease, unspecified: Secondary | ICD-10-CM | POA: Diagnosis not present

## 2019-01-28 DIAGNOSIS — Z720 Tobacco use: Secondary | ICD-10-CM

## 2019-01-28 DIAGNOSIS — F17210 Nicotine dependence, cigarettes, uncomplicated: Secondary | ICD-10-CM

## 2019-01-28 MED ORDER — NICOTINE POLACRILEX 4 MG MT LOZG: 4.0000 mg | LOZENGE | OROMUCOSAL | 6 refills | Status: DC | PRN

## 2019-01-28 NOTE — Assessment & Plan Note (Signed)
Assessment: BMI 33.06  Plan: Increase daily physical activity Work to lower BMI to healthy weight

## 2019-01-28 NOTE — Assessment & Plan Note (Addendum)
Assessment: July/2019 pulmonary function test shows COPD Gold 2  Patient continues to smoke, 1 pack/day Not maintained on any inhalers mMRC 2 Lungs clear to auscultation today  Plan: 54-month follow-up with our office Present sooner if symptoms worsen Increase daily physical activity We recommend that you stop smoking 4 mg nicotine lozenges prescribed today

## 2019-01-28 NOTE — Patient Instructions (Signed)
Work on increasing daily physical activity   Note your daily symptoms > remember "red flags" for COPD:   >>>Increase in cough >>>increase in sputum production >>>increase in shortness of breath or activity  intolerance.   If you notice these symptoms, please call the office to be seen.    We recommend that you stop smoking.  >>>You need to set a quit date >>>If you have friends or family who smoke, let them know you are trying to quit and not to smoke around you or in your living environment  Smoking Cessation Resources:  1 800 QUIT NOW  >>> Patient to call this resource and utilize it to help support her quit smoking >>> Keep up your hard work with stopping smoking  You can also contact the [facility] >>>For smoking cessation classes call [phone]  We do not recommend using e-cigarettes as a form of stopping smoking  You can sign up for smoking cessation support texts and information:  >>>https://smokefree.gov/smokefreetxt   >>>Avoid acidic beverages such as coffee, carbonated beverages before and during lozenge use.  A soft acidic beverages lower oral pH which cause nicotine to not be absorbed properly >>>If you chew the gum too quickly or vigorously you could have nausea, vomiting, abdominal pain, constipation, hiccups, headache, sore jaw, mouth irritation ulcers  Nicotine lozenge: Lozenges are commonly uses short acting NRT product  >>>Smokers who smoke within 30 minutes of awakening should use 4 mg dose >>>Smokers who wait more than 30 minutes after awakening to smoke should use 2 mg dose  Can use up to 1 lozenge every 1-2 hours for 6 weeks >>>Total amount of lozenges that can be used per day as 20 >>>Gradually reduce number of lozenges used per day after 2 weeks of use  Place lozenge in mouth and allowed to dissolve for 30 minutes loss and does not need to be chewed  Lozenges have advantages to be able to be used in people with TMG, poor dentition,  dentures      Follow up in 6 months with Dr. Chase Caller or Wyn Quaker FNP      It is flu season:   >>> Best ways to protect herself from the flu: Receive the yearly flu vaccine, practice good hand hygiene washing with soap and also using hand sanitizer when available, eat a nutritious meals, get adequate rest, hydrate appropriately   Please contact the office if your symptoms worsen or you have concerns that you are not improving.   Thank you for choosing [facility] for your healthcare, and for allowing [place] to partner with you on your healthcare journey. I am thankful to be able to provide care to you today.   [name]    Health Risks of Smoking Smoking cigarettes is very bad for your health. Tobacco smoke has over 200 known poisons in it. It contains the poisonous gases nitrogen oxide and carbon monoxide. There are over 60 chemicals in tobacco smoke that cause cancer. Smoking is difficult to quit because a chemical in tobacco, called nicotine, causes addiction or dependence. When you smoke and inhale, nicotine is absorbed rapidly into the bloodstream through your lungs. Both inhaled and non-inhaled nicotine may be addictive. What are the risks of cigarette smoke? Cigarette smokers have an increased risk of many serious medical problems, including:  Lung cancer.  Lung disease, such as pneumonia, bronchitis, and emphysema.  Chest pain (angina) and heart attack because the heart is not getting enough oxygen.  Heart  disease and peripheral blood vessel disease.  High blood pressure (hypertension).  Stroke.  Oral cancer, including cancer of the lip, mouth, or voice box.  Bladder cancer.  Pancreatic cancer.  Cervical cancer.  Pregnancy complications, including premature birth.  Stillbirths and smaller newborn babies, birth defects, and genetic damage to sperm.  Early menopause.  Lower estrogen level for women.  Infertility.  Facial  wrinkles.  Blindness.  Increased risk of broken bones (fractures).  Senile dementia.  Stomach ulcers and internal bleeding.  Delayed wound healing and increased risk of complications during surgery.  Even smoking lightly shortens your life expectancy by several years. Because of secondhand smoke exposure, children of smokers have an increased risk of the following:  Sudden infant death syndrome (SIDS).  Respiratory infections.  Lung cancer.  Heart disease.  Ear infections. What are the benefits of quitting? There are many health benefits of quitting smoking. Here are some of them:  Within days of quitting smoking, your risk of having a heart attack decreases, your blood flow improves, and your lung capacity improves. Blood pressure, pulse rate, and breathing patterns start returning to normal soon after quitting.  Within months, your lungs may clear up completely.  Quitting for 10 years reduces your risk of developing lung cancer and heart disease to almost that of a nonsmoker.  People who quit may see an improvement in their overall quality of life. How do I quit smoking?     Smoking is an addiction with both physical and psychological effects, and longtime habits can be hard to change. Your health care provider can recommend:  Programs and community resources, which may include group support, education, or talk therapy.  Prescription medicines to help reduce cravings.  Nicotine replacement products, such as patches, gum, and nasal sprays. Use these products only as directed. Do not replace cigarette smoking with electronic cigarettes, which are commonly called e-cigarettes. The safety of e-cigarettes is not known, and some may contain harmful chemicals.  A combination of two or more of these methods. Where to find more information  American Lung Association: www.lung.org  American Cancer Society: www.cancer.org Summary  Smoking cigarettes is very bad for your  health. Cigarette smokers have an increased risk of many serious medical problems, including several cancers, heart disease, and stroke.  Smoking is an addiction with both physical and psychological effects, and longtime habits can be hard to change.  By stopping right away, you can greatly reduce the risk of medical problems for you and your family.  To help you quit smoking, your health care provider can recommend programs, community resources, prescription medicines, and nicotine replacement products such as patches, gum, and nasal sprays. This information is not intended to replace advice given to you by your health care provider. Make sure you discuss any questions you have with your health care provider. Document Released: 12/28/2004 Document Revised: 02/21/2018 Document Reviewed: 11/24/2016 Elsevier Interactive Patient Education  2019 [employer].

## 2019-01-28 NOTE — Assessment & Plan Note (Signed)
Assessment: Current everyday smoker Smoking 1 pack/day Patient has not set a quit date 35-pack-year smoking history  Plan: Patient is willing to try 4 mg nicotine lozenges Patient is willing to try to stop smoking Patient needs a set quit date Follow-up with our office in 6 months or sooner When patient turns 11 -referr to lung cancer screening clinic

## 2019-02-11 ENCOUNTER — Other Ambulatory Visit: Payer: Self-pay | Admitting: Surgery

## 2019-02-11 DIAGNOSIS — E042 Nontoxic multinodular goiter: Secondary | ICD-10-CM

## 2019-02-17 MED ORDER — ALBUTEROL SULFATE HFA 108 (90 BASE) MCG/ACT IN AERS: 2.0000 | INHALATION_SPRAY | Freq: Four times a day (QID) | RESPIRATORY_TRACT | 1 refills | Status: DC | PRN

## 2019-02-20 ENCOUNTER — Other Ambulatory Visit: Payer: Medicaid Other

## 2019-06-11 ENCOUNTER — Other Ambulatory Visit: Payer: Self-pay

## 2019-06-11 ENCOUNTER — Emergency Department (HOSPITAL_BASED_OUTPATIENT_CLINIC_OR_DEPARTMENT_OTHER): Payer: Medicaid Other

## 2019-06-11 ENCOUNTER — Emergency Department (HOSPITAL_BASED_OUTPATIENT_CLINIC_OR_DEPARTMENT_OTHER)
Admission: EM | Admit: 2019-06-11 | Discharge: 2019-06-11 | Disposition: A | Discharge status: Home / Self Care | Payer: Medicaid Other | Attending: Emergency Medicine | Admitting: Emergency Medicine

## 2019-06-11 ENCOUNTER — Encounter (HOSPITAL_BASED_OUTPATIENT_CLINIC_OR_DEPARTMENT_OTHER): Payer: Self-pay | Admitting: *Deleted

## 2019-06-11 DIAGNOSIS — J449 Chronic obstructive pulmonary disease, unspecified: Secondary | ICD-10-CM | POA: Diagnosis not present

## 2019-06-11 DIAGNOSIS — Z79899 Other long term (current) drug therapy: Secondary | ICD-10-CM | POA: Diagnosis not present

## 2019-06-11 DIAGNOSIS — R1084 Generalized abdominal pain: Secondary | ICD-10-CM | POA: Diagnosis not present

## 2019-06-11 DIAGNOSIS — F17210 Nicotine dependence, cigarettes, uncomplicated: Secondary | ICD-10-CM | POA: Insufficient documentation

## 2019-06-11 DIAGNOSIS — R109 Unspecified abdominal pain: Secondary | ICD-10-CM

## 2019-06-11 LAB — CBC WITH DIFFERENTIAL/PLATELET
Abs Immature Granulocytes: 0.04 10*3/uL (ref 0.00–0.07)
Basophils Absolute: 0.1 10*3/uL (ref 0.0–0.1)
Basophils Relative: 1 %
Eosinophils Absolute: 0.5 10*3/uL (ref 0.0–0.5)
Eosinophils Relative: 5 %
HCT: 43 % (ref 36.0–46.0)
Hemoglobin: 14.7 g/dL (ref 12.0–15.0)
Immature Granulocytes: 0 %
Lymphocytes Relative: 17 %
Lymphs Abs: 1.7 10*3/uL (ref 0.7–4.0)
MCH: 31.2 pg (ref 26.0–34.0)
MCHC: 34.2 g/dL (ref 30.0–36.0)
MCV: 91.3 fL (ref 80.0–100.0)
Monocytes Absolute: 0.8 10*3/uL (ref 0.1–1.0)
Monocytes Relative: 8 %
Neutro Abs: 6.9 10*3/uL (ref 1.7–7.7)
Neutrophils Relative %: 69 %
Platelets: 223 10*3/uL (ref 150–400)
RBC: 4.71 MIL/uL (ref 3.87–5.11)
RDW: 12 % (ref 11.5–15.5)
WBC: 10 10*3/uL (ref 4.0–10.5)
nRBC: 0 % (ref 0.0–0.2)

## 2019-06-11 LAB — URINALYSIS, ROUTINE W REFLEX MICROSCOPIC
Bilirubin Urine: NEGATIVE
Glucose, UA: NEGATIVE mg/dL
Ketones, ur: NEGATIVE mg/dL
Leukocytes,Ua: NEGATIVE
Nitrite: NEGATIVE
Protein, ur: NEGATIVE mg/dL
Specific Gravity, Urine: 1.02 (ref 1.005–1.030)
pH: 7 (ref 5.0–8.0)

## 2019-06-11 LAB — COMPREHENSIVE METABOLIC PANEL
ALT: 12 U/L (ref 0–44)
AST: 14 U/L — ABNORMAL LOW (ref 15–41)
Albumin: 4 g/dL (ref 3.5–5.0)
Alkaline Phosphatase: 61 U/L (ref 38–126)
Anion gap: 8 (ref 5–15)
BUN: 13 mg/dL (ref 6–20)
CO2: 21 mmol/L — ABNORMAL LOW (ref 22–32)
Calcium: 8.5 mg/dL — ABNORMAL LOW (ref 8.9–10.3)
Chloride: 108 mmol/L (ref 98–111)
Creatinine, Ser: 0.66 mg/dL (ref 0.44–1.00)
GFR calc Af Amer: >60 mL/min (ref >60)
GFR calc non Af Amer: >60 mL/min (ref >60)
Glucose, Bld: 109 mg/dL — ABNORMAL HIGH (ref 70–99)
Potassium: 3.3 mmol/L — ABNORMAL LOW (ref 3.5–5.1)
Sodium: 137 mmol/L (ref 135–145)
Total Bilirubin: 1 mg/dL (ref 0.3–1.2)
Total Protein: 6.8 g/dL (ref 6.5–8.1)

## 2019-06-11 LAB — PREGNANCY, URINE: Preg Test, Ur: NEGATIVE

## 2019-06-11 LAB — URINALYSIS, MICROSCOPIC (REFLEX): WBC, UA: NONE SEEN WBC/hpf (ref 0–5)

## 2019-06-11 LAB — LIPASE, BLOOD: Lipase: 35 U/L (ref 11–51)

## 2019-06-11 MED ORDER — SODIUM CHLORIDE 0.9 % IV BOLUS
1000.0000 mL | Freq: Once | INTRAVENOUS | Status: AC
  Administered 2019-06-11: 1000 mL via INTRAVENOUS

## 2019-06-11 MED ORDER — ONDANSETRON HCL 4 MG PO TABS: 4.0000 mg | ORAL_TABLET | Freq: Four times a day (QID) | ORAL | 0 refills | Status: AC

## 2019-06-11 MED ORDER — FENTANYL CITRATE (PF) 100 MCG/2ML IJ SOLN: 50.0000 ug | Freq: Once | INTRAMUSCULAR | Status: DC

## 2019-06-11 MED ORDER — ONDANSETRON HCL 4 MG/2ML IJ SOLN
4.0000 mg | Freq: Once | INTRAMUSCULAR | Status: AC
  Administered 2019-06-11: 4 mg via INTRAVENOUS
  Filled 2019-06-11: qty 2

## 2019-06-11 MED ORDER — IOHEXOL 300 MG/ML  SOLN
100.0000 mL | Freq: Once | INTRAMUSCULAR | Status: AC | PRN
  Administered 2019-06-11: 100 mL via INTRAVENOUS

## 2019-06-11 MED ORDER — ONDANSETRON HCL 4 MG PO TABS: 4.0000 mg | ORAL_TABLET | Freq: Four times a day (QID) | ORAL | 0 refills | Status: DC

## 2019-06-11 NOTE — Discharge Instructions (Signed)
Follow up with OB/GYN as discussed.
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
## 2019-06-11 NOTE — ED Triage Notes (Signed)
Pt c/o abd pain and weakness x 3 months seen by Obgyn for same
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
## 2019-07-29 ENCOUNTER — Ambulatory Visit (INDEPENDENT_AMBULATORY_CARE_PROVIDER_SITE_OTHER): Payer: Medicaid Other

## 2019-07-29 ENCOUNTER — Ambulatory Visit: Payer: Medicaid Other | Admitting: Pulmonary Disease

## 2019-07-29 ENCOUNTER — Encounter: Payer: Self-pay | Admitting: Pulmonary Disease

## 2019-07-29 ENCOUNTER — Other Ambulatory Visit: Payer: Self-pay

## 2019-07-29 VITALS — BP 118/84 | HR 80 | Temp 97.8°F | Ht 66.0 in | Wt 203.0 lb

## 2019-07-29 DIAGNOSIS — Z0000 Encounter for general adult medical examination without abnormal findings: Secondary | ICD-10-CM

## 2019-07-29 DIAGNOSIS — J449 Chronic obstructive pulmonary disease, unspecified: Secondary | ICD-10-CM | POA: Diagnosis not present

## 2019-07-29 DIAGNOSIS — Z720 Tobacco use: Secondary | ICD-10-CM

## 2019-07-29 DIAGNOSIS — F17210 Nicotine dependence, cigarettes, uncomplicated: Secondary | ICD-10-CM | POA: Diagnosis not present

## 2019-07-29 NOTE — Patient Instructions (Addendum)
You were seen today by Christine Rinne, NP  for:   1. Chronic obstructive pulmonary disease, unspecified COPD type ([place])  - DG Chest 2 View; Future  We recommend you not to smoke   Remain active   Note your daily symptoms > remember "red flags" for COPD:   >>>Increase in cough >>>increase in sputum production >>>increase in shortness of breath or activity  intolerance.   If you notice these symptoms, please call the office to be seen.    2. Tobacco abuse  Offered flu vaccine and pneumovax23, you declined   We recommend that you stop smoking.  >>>You need to set a quit date >>>If you have friends or family who smoke, let them know you are trying to quit and not to smoke around you or in your living environment  Smoking Cessation Resources:  1 800 QUIT NOW  >>> Patient to call this resource and utilize it to help support her quit smoking >>> Keep up your hard work with stopping smoking  You can also contact the [facility] >>>For smoking cessation classes call [phone]  We do not recommend using e-cigarettes as a form of stopping smoking  You can sign up for smoking cessation support texts and information:  >>>https://smokefree.gov/smokefreetxt   Nicotine patches: >>>Make sure you rotate sites that you do not get skin irritation, Apply 1 patch each morning to a non-hairy skin site  If you are smoking greater than 10 cigarettes/day and weigh over 45 kg start with the nicotine patch of 21 mg a day for 6 weeks, then 14 mg a day for 2 weeks, then finished with 7 mg a day for 2 weeks, then stop  If you are smoking less than 10 cigarettes a day or weight less than 45 kg start with medium dose pack of 14 mg a day for 6 weeks, followed by 7 mg a day for 2 weeks   >>>If insomnia occurs you are having trouble sleeping you can take the patch off at night, and place a new one on in the morning >>>If the patch is removed at night and you have morning cravings start  short acting nicotine replacement therapy such as gum or lozenges  Nicotine Gum:  >>>Smokers who smoke 25 or more cigarettes a day should use 4 mg dose >>>Smokers who smoke fewer than 25 cigarettes a day should use 2 mg dose  Proper chewing of gum is important for optimal results.   >>>Do not chew gum to rapidly.  Once you start chewing eating tasty peppery taste and slide the gum to your cheek.  When the taste disappears to a few more times.  Repeat this for 30 minutes.  Then discard the gum.  >>>Avoid acidic beverages such as coffee, carbonated beverages before and during gum use.  A soft acidic beverages lower oral pH which cause nicotine to not be absorbed properly >>>If you chew the gum too quickly or vigorously you could have nausea, vomiting, abdominal pain, constipation, hiccups, headache, sore jaw, mouth irritation ulcers     We recommend today:  Orders Placed This Encounter  Procedures   DG Chest 2 View    Standing Status:   Future    Standing Expiration Date:   09/27/2020    Order Specific Question:   Reason for Exam (SYMPTOM  OR DIAGNOSIS REQUIRED)    Answer:   copd, smoking    Order Specific Question:   Preferred imaging location?    Answer:  Internal    Order Specific Question:   Radiology Contrast Protocol - do NOT remove file path    Answer:   [email or web address]   Orders Placed This Encounter  Procedures   DG Chest 2 View   No orders of the defined types were placed in this encounter.   Follow Up:    Return in about 6 months (around 01/29/2020), or if symptoms worsen or fail to improve, for Follow up with Dr. Purnell Shoemaker, Follow up with [name].   Please do your part to reduce the spread of COVID-19:      Reduce your risk of any infection  and COVID19 by using the similar precautions used for avoiding the common cold or flu:   Wash your hands often with soap and warm water for at least 20 seconds.  If  soap and water are not readily available, use an alcohol-based hand sanitizer with at least 60% alcohol.   If coughing or sneezing, cover your mouth and nose by coughing or sneezing into the elbow areas of your shirt or coat, into a tissue or into your sleeve (not your hands).  WEAR A MASK when in public   Avoid shaking hands with others and consider head nods or verbal greetings only.  Avoid touching your eyes, nose, or mouth with unwashed hands.   Avoid close contact with people who are sick.  Avoid places or events with large numbers of people in one location, like concerts or sporting events.  If you have some symptoms but not all symptoms, continue to monitor at home and seek medical attention if your symptoms worsen.  If you are having a medical emergency, call 911.   [place] / e-Visit: [email or web address]         [facility]: [facility]: [id number]                   [facility]: [id number]     It is flu season:   >>> Best ways to protect herself from the flu: Receive the yearly flu vaccine, practice good hand hygiene washing with soap and also using hand sanitizer when available, eat a nutritious meals, get adequate rest, hydrate appropriately   Please contact the office if your symptoms worsen or you have concerns that you are not improving.   Thank you for choosing [facility] for your healthcare, and for allowing [place] to partner with you on your healthcare journey. I am thankful to be able to provide care to you today.   [name]     Health Risks of Smoking Smoking cigarettes is very bad for your health. Tobacco smoke has over 200 known poisons in it. It contains the poisonous gases nitrogen oxide and carbon monoxide. There are over 60 chemicals in tobacco smoke that cause cancer. Smoking  is difficult to quit because a chemical in tobacco, called nicotine, causes addiction or dependence. When you smoke and inhale, nicotine is absorbed rapidly into the bloodstream through your lungs. Both inhaled and non-inhaled nicotine may be addictive. What are the risks of cigarette smoke? Cigarette smokers have an increased risk of many serious medical problems, including:  Lung cancer.  Lung disease, such as pneumonia, bronchitis, and emphysema.  Chest pain (angina) and heart attack because the heart is not getting enough oxygen.  Heart disease and peripheral blood vessel disease.  High blood pressure (hypertension).  Stroke.  Oral cancer, including cancer of the lip, mouth, or voice box.  Bladder cancer.  Pancreatic cancer.  Cervical cancer.  Pregnancy complications, including premature birth.  Stillbirths and smaller newborn babies, birth defects, and genetic damage to sperm.  Early menopause.  Lower estrogen level for women.  Infertility.  Facial wrinkles.  Blindness.  Increased risk of broken bones (fractures).  Senile dementia.  Stomach ulcers and internal bleeding.  Delayed wound healing and increased risk of complications during surgery.  Even smoking lightly shortens your life expectancy by several years. Because of secondhand smoke exposure, children of smokers have an increased risk of the following:  Sudden infant death syndrome (SIDS).  Respiratory infections.  Lung cancer.  Heart disease.  Ear infections. What are the benefits of quitting? There are many health benefits of quitting smoking. Here are some of them:  Within days of quitting smoking, your risk of having a heart attack decreases, your blood flow improves, and your lung capacity improves. Blood pressure, pulse rate, and breathing patterns start returning to normal soon after quitting.  Within months, your lungs may clear up completely.  Quitting for 10 years reduces your  risk of developing lung cancer and heart disease to almost that of a nonsmoker.  People who quit may see an improvement in their overall quality of life. How do I quit smoking?     Smoking is an addiction with both physical and psychological effects, and longtime habits can be hard to change. Your health care provider can recommend:  Programs and community resources, which may include group support, education, or talk therapy.  Prescription medicines to help reduce cravings.  Nicotine replacement products, such as patches, gum, and nasal sprays. Use these products only as directed. Do not replace cigarette smoking with electronic cigarettes, which are commonly called e-cigarettes. The safety of e-cigarettes is not known, and some may contain harmful chemicals.  A combination of two or more of these methods. Where to find more information  American Lung Association: www.lung.org  American Cancer Society: www.cancer.org Summary  Smoking cigarettes is very bad for your health. Cigarette smokers have an increased risk of many serious medical problems, including several cancers, heart disease, and stroke.  Smoking is an addiction with both physical and psychological effects, and longtime habits can be hard to change.  By stopping right away, you can greatly reduce the risk of medical problems for you and your family.  To help you quit smoking, your health care provider can recommend programs, community resources, prescription medicines, and nicotine replacement products such as patches, gum, and nasal sprays. This information is not intended to replace advice given to you by your health care provider. Make sure you discuss any questions you have with your health care provider. Document Released: 12/28/2004 Document Revised: 02/21/2018 Document Reviewed: 11/24/2016 Elsevier Patient Education  2020 [employer].

## 2019-07-29 NOTE — Assessment & Plan Note (Signed)
Plan: Offered flu vaccine, patient declined Offered Pneumovax 23, patient declined Recommend the patient stop smoking When patient turns age 53, refer to lung cancer screening program

## 2019-07-29 NOTE — Assessment & Plan Note (Addendum)
Assessment: Current everyday smoker Smoking 1 pack/day Patient has not set a quit date 35-pack-year smoking history  Plan: Chest x-ray today Patient is willing to try 4 mg nicotine gum Patient is willing to try to stop smoking Patient needs a set quit date Follow-up with our office in 6 months or sooner When patient turns 38 -referr to lung cancer screening clinic

## 2019-07-29 NOTE — Assessment & Plan Note (Addendum)
Assessment: July/2019 pulmonary function test shows COPD Gold 2  Patient continues to smoke, 1 pack/day Not maintained on any inhalers mMRC 1 Lungs clear to auscultation today  Plan: Chest x-ray today 71-month follow-up with our office Present sooner if symptoms worsen Increase daily physical activity We recommend that you stop smoking Recommended flu vaccine, patient declined Recommended Pneumovax 23, patient declined Patient to purchase nicotine replacement therapy gum 4 mg

## 2019-07-29 NOTE — Progress Notes (Signed)
Your chest x-ray results of come back.  Showing no acute changes.  No plan of care changes at this time.  Keep follow-up appointment.    Follow-up with our office if symptoms worsen or you do not feel like you are improving under her current regimen.  It was a pleasure taking care of you,  Brian Mack, FNP 

## 2019-07-31 NOTE — Progress Notes (Signed)
Called and spoke to pt. Informed her of the results and recs per Aaron Edelman, NP. Pt verbalized understanding and denied any further questions or concerns at this time.

## 2019-10-04 ENCOUNTER — Other Ambulatory Visit: Payer: Self-pay

## 2019-10-04 ENCOUNTER — Encounter (HOSPITAL_BASED_OUTPATIENT_CLINIC_OR_DEPARTMENT_OTHER): Payer: Self-pay | Admitting: Emergency Medicine

## 2019-10-04 ENCOUNTER — Emergency Department (HOSPITAL_BASED_OUTPATIENT_CLINIC_OR_DEPARTMENT_OTHER)
Admission: EM | Admit: 2019-10-04 | Discharge: 2019-10-04 | Disposition: A | Discharge status: Home / Self Care | Payer: Medicaid Other | Attending: Emergency Medicine | Admitting: Emergency Medicine

## 2019-10-04 ENCOUNTER — Emergency Department (HOSPITAL_BASED_OUTPATIENT_CLINIC_OR_DEPARTMENT_OTHER): Payer: Medicaid Other

## 2019-10-04 DIAGNOSIS — R1012 Left upper quadrant pain: Secondary | ICD-10-CM | POA: Insufficient documentation

## 2019-10-04 DIAGNOSIS — F17210 Nicotine dependence, cigarettes, uncomplicated: Secondary | ICD-10-CM | POA: Insufficient documentation

## 2019-10-04 DIAGNOSIS — Z8541 Personal history of malignant neoplasm of cervix uteri: Secondary | ICD-10-CM | POA: Insufficient documentation

## 2019-10-04 DIAGNOSIS — R109 Unspecified abdominal pain: Secondary | ICD-10-CM

## 2019-10-04 DIAGNOSIS — J449 Chronic obstructive pulmonary disease, unspecified: Secondary | ICD-10-CM | POA: Diagnosis not present

## 2019-10-04 DIAGNOSIS — R1031 Right lower quadrant pain: Secondary | ICD-10-CM | POA: Insufficient documentation

## 2019-10-04 DIAGNOSIS — R197 Diarrhea, unspecified: Secondary | ICD-10-CM | POA: Diagnosis not present

## 2019-10-04 LAB — COMPREHENSIVE METABOLIC PANEL
ALT: 15 U/L (ref 0–44)
AST: 23 U/L (ref 15–41)
Albumin: 3.9 g/dL (ref 3.5–5.0)
Alkaline Phosphatase: 58 U/L (ref 38–126)
Anion gap: 10 (ref 5–15)
BUN: 9 mg/dL (ref 6–20)
CO2: 23 mmol/L (ref 22–32)
Calcium: 8.8 mg/dL — ABNORMAL LOW (ref 8.9–10.3)
Chloride: 108 mmol/L (ref 98–111)
Creatinine, Ser: 0.57 mg/dL (ref 0.44–1.00)
GFR calc Af Amer: >60 mL/min (ref >60)
GFR calc non Af Amer: >60 mL/min (ref >60)
Glucose, Bld: 104 mg/dL — ABNORMAL HIGH (ref 70–99)
Potassium: 4 mmol/L (ref 3.5–5.1)
Sodium: 141 mmol/L (ref 135–145)
Total Bilirubin: 1 mg/dL (ref 0.3–1.2)
Total Protein: 6.4 g/dL — ABNORMAL LOW (ref 6.5–8.1)

## 2019-10-04 LAB — CBC WITH DIFFERENTIAL/PLATELET
Abs Immature Granulocytes: 0.02 10*3/uL (ref 0.00–0.07)
Basophils Absolute: 0.1 10*3/uL (ref 0.0–0.1)
Basophils Relative: 1 %
Eosinophils Absolute: 0.4 10*3/uL (ref 0.0–0.5)
Eosinophils Relative: 5 %
HCT: 43.6 % (ref 36.0–46.0)
Hemoglobin: 14.8 g/dL (ref 12.0–15.0)
Immature Granulocytes: 0 %
Lymphocytes Relative: 18 %
Lymphs Abs: 1.4 10*3/uL (ref 0.7–4.0)
MCH: 31.8 pg (ref 26.0–34.0)
MCHC: 33.9 g/dL (ref 30.0–36.0)
MCV: 93.8 fL (ref 80.0–100.0)
Monocytes Absolute: 0.5 10*3/uL (ref 0.1–1.0)
Monocytes Relative: 6 %
Neutro Abs: 5.5 10*3/uL (ref 1.7–7.7)
Neutrophils Relative %: 70 %
Platelets: 194 10*3/uL (ref 150–400)
RBC: 4.65 MIL/uL (ref 3.87–5.11)
RDW: 12.4 % (ref 11.5–15.5)
WBC: 7.8 10*3/uL (ref 4.0–10.5)
nRBC: 0 % (ref 0.0–0.2)

## 2019-10-04 LAB — LIPASE, BLOOD: Lipase: 26 U/L (ref 11–51)

## 2019-10-04 LAB — URINALYSIS, ROUTINE W REFLEX MICROSCOPIC
Bilirubin Urine: NEGATIVE
Glucose, UA: NEGATIVE mg/dL
Hgb urine dipstick: NEGATIVE
Ketones, ur: NEGATIVE mg/dL
Leukocytes,Ua: NEGATIVE
Nitrite: NEGATIVE
Protein, ur: NEGATIVE mg/dL
Specific Gravity, Urine: <1.005 — ABNORMAL LOW (ref 1.005–1.030)
pH: 6.5 (ref 5.0–8.0)

## 2019-10-04 LAB — PREGNANCY, URINE: Preg Test, Ur: NEGATIVE

## 2019-10-04 MED ORDER — DICYCLOMINE HCL 20 MG PO TABS: 20.0000 mg | ORAL_TABLET | Freq: Two times a day (BID) | ORAL | 0 refills | Status: AC | PRN

## 2019-10-04 MED ORDER — DICYCLOMINE HCL 10 MG PO CAPS
10.0000 mg | ORAL_CAPSULE | Freq: Once | ORAL | Status: AC
  Administered 2019-10-04: 15:00:00 10 mg via ORAL
  Filled 2019-10-04: qty 1

## 2019-10-04 MED ORDER — FAMOTIDINE 20 MG PO TABS: 20.0000 mg | ORAL_TABLET | Freq: Every day | ORAL | 0 refills | Status: AC

## 2019-10-04 MED ORDER — PANTOPRAZOLE SODIUM 20 MG PO TBEC
20.0000 mg | DELAYED_RELEASE_TABLET | Freq: Once | ORAL | Status: DC
  Filled 2019-10-04: qty 1

## 2019-10-04 MED ORDER — PANTOPRAZOLE SODIUM 40 MG PO TBEC
DELAYED_RELEASE_TABLET | ORAL | Status: AC
  Administered 2019-10-04: 15:00:00 40 mg
  Filled 2019-10-04: qty 1

## 2019-10-04 MED ORDER — IOHEXOL 300 MG/ML  SOLN
100.0000 mL | Freq: Once | INTRAMUSCULAR | Status: AC | PRN
  Administered 2019-10-04: 100 mL via INTRAVENOUS
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
## 2019-10-04 NOTE — ED Notes (Signed)
ED Provider at bedside. 

## 2019-10-04 NOTE — Discharge Instructions (Addendum)
Stop eating fatty/greasy meals.  Eat small, more frequent meals.  Remain upright for at least 60 to 90 minutes after eating. Take Pepcid daily to decrease stomach acid. Use Tylenol or Bentyl as needed for stomach pain. Follow-up with your GI doctor if your symptoms not improving. Return to the emergency room if you develop high fevers, severe worsening pain, any new, worsening, concerning symptoms.

## 2019-10-04 NOTE — ED Notes (Signed)
Patient transported to CT 

## 2019-10-04 NOTE — ED Notes (Signed)
Attempted IV to RAC and right hand, unsuccessful, tol well

## 2019-10-04 NOTE — ED Triage Notes (Signed)
Pt c/o RLQ pain after eating with radiation to back x 1 week. Pt states "I feel off in my head". Pt denies emesis, reports decreased appetite. Denies sob, also reports LUQ pain as well.

## 2019-10-17 ENCOUNTER — Other Ambulatory Visit: Payer: Self-pay

## 2019-10-17 ENCOUNTER — Ambulatory Visit: Payer: Medicaid Other | Admitting: Nurse Practitioner

## 2019-10-17 ENCOUNTER — Encounter: Payer: Self-pay | Admitting: Nurse Practitioner

## 2019-10-17 VITALS — BP 124/80 | HR 83 | Temp 97.9°F | Ht 66.0 in | Wt 210.0 lb

## 2019-10-17 DIAGNOSIS — R1031 Right lower quadrant pain: Secondary | ICD-10-CM

## 2019-10-17 MED ORDER — LIDOCAINE 5 % EX PTCH: 1.0000 | MEDICATED_PATCH | CUTANEOUS | 0 refills | Status: AC

## 2019-10-17 NOTE — Patient Instructions (Addendum)
If you are age 51 or older, your body mass index should be between 23-30. Your Body mass index is 33.89 kg/m. If this is out of the aforementioned range listed, please consider follow up with your Primary Care Provider.  If you are age 19 or younger, your body mass index should be between 19-25. Your Body mass index is 33.89 kg/m. If this is out of the aformentioned range listed, please consider follow up with your Primary Care Provider.   We have sent the following medications to your pharmacy for you to pick up at your convenience: Lidocaine patch - apply to right lower quadrant for 7 days.  If insurance will not cover, then use Salonpas (over-the-counter)   Call if you develop diarrhea.   Thank you for choosing me and [facility].   Tye Savoy, NP
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
## 2019-10-28 ENCOUNTER — Encounter: Payer: Self-pay | Admitting: Emergency Medicine

## 2019-10-28 NOTE — Telephone Encounter (Signed)
10/28/2019 1140  Yes okay to generate letter.  Please state the patient has COPD as well as a current every day smoker.  This puts her at a high risk of complications if in fact she does contract COVID-19.  Please make the letter available via MyChart for the patient and I can also sign and we can mail a copy.  Wyn Quaker, FNP

## 2020-04-30 ENCOUNTER — Other Ambulatory Visit: Payer: Self-pay | Admitting: Internal Medicine

## 2021-03-19 ENCOUNTER — Encounter (HOSPITAL_COMMUNITY): Payer: Self-pay

## 2021-03-19 ENCOUNTER — Emergency Department (HOSPITAL_COMMUNITY)
Admission: EM | Admit: 2021-03-19 | Discharge: 2021-03-19 | Disposition: A | Discharge status: Home / Self Care | Payer: Medicaid Other | Attending: Emergency Medicine | Admitting: Emergency Medicine

## 2021-03-19 ENCOUNTER — Emergency Department (HOSPITAL_COMMUNITY): Payer: Medicaid Other

## 2021-03-19 DIAGNOSIS — K529 Noninfective gastroenteritis and colitis, unspecified: Secondary | ICD-10-CM | POA: Insufficient documentation

## 2021-03-19 DIAGNOSIS — Z8541 Personal history of malignant neoplasm of cervix uteri: Secondary | ICD-10-CM | POA: Diagnosis not present

## 2021-03-19 DIAGNOSIS — J449 Chronic obstructive pulmonary disease, unspecified: Secondary | ICD-10-CM | POA: Diagnosis not present

## 2021-03-19 DIAGNOSIS — F17210 Nicotine dependence, cigarettes, uncomplicated: Secondary | ICD-10-CM | POA: Insufficient documentation

## 2021-03-19 DIAGNOSIS — K219 Gastro-esophageal reflux disease without esophagitis: Secondary | ICD-10-CM | POA: Insufficient documentation

## 2021-03-19 DIAGNOSIS — R109 Unspecified abdominal pain: Secondary | ICD-10-CM | POA: Diagnosis present

## 2021-03-19 LAB — URINALYSIS, ROUTINE W REFLEX MICROSCOPIC
Bacteria, UA: NONE SEEN
Bilirubin Urine: NEGATIVE
Glucose, UA: NEGATIVE mg/dL
Ketones, ur: NEGATIVE mg/dL
Leukocytes,Ua: NEGATIVE
Nitrite: NEGATIVE
Protein, ur: NEGATIVE mg/dL
Specific Gravity, Urine: 1.018 (ref 1.005–1.030)
pH: 5 (ref 5.0–8.0)

## 2021-03-19 LAB — CBC WITH DIFFERENTIAL/PLATELET
Abs Immature Granulocytes: 0.02 10*3/uL (ref 0.00–0.07)
Basophils Absolute: 0.1 10*3/uL (ref 0.0–0.1)
Basophils Relative: 1 %
Eosinophils Absolute: 0.2 10*3/uL (ref 0.0–0.5)
Eosinophils Relative: 2 %
HCT: 46.1 % — ABNORMAL HIGH (ref 36.0–46.0)
Hemoglobin: 15.5 g/dL — ABNORMAL HIGH (ref 12.0–15.0)
Immature Granulocytes: 0 %
Lymphocytes Relative: 16 %
Lymphs Abs: 1.5 10*3/uL (ref 0.7–4.0)
MCH: 32 pg (ref 26.0–34.0)
MCHC: 33.6 g/dL (ref 30.0–36.0)
MCV: 95.1 fL (ref 80.0–100.0)
Monocytes Absolute: 0.5 10*3/uL (ref 0.1–1.0)
Monocytes Relative: 6 %
Neutro Abs: 6.6 10*3/uL (ref 1.7–7.7)
Neutrophils Relative %: 75 %
Platelets: 196 10*3/uL (ref 150–400)
RBC: 4.85 MIL/uL (ref 3.87–5.11)
RDW: 12.9 % (ref 11.5–15.5)
WBC: 8.9 10*3/uL (ref 4.0–10.5)
nRBC: 0 % (ref 0.0–0.2)

## 2021-03-19 LAB — COMPREHENSIVE METABOLIC PANEL
ALT: 14 U/L (ref 0–44)
AST: 14 U/L — ABNORMAL LOW (ref 15–41)
Albumin: 4.3 g/dL (ref 3.5–5.0)
Alkaline Phosphatase: 68 U/L (ref 38–126)
Anion gap: 8 (ref 5–15)
BUN: 9 mg/dL (ref 6–20)
CO2: 25 mmol/L (ref 22–32)
Calcium: 9.1 mg/dL (ref 8.9–10.3)
Chloride: 108 mmol/L (ref 98–111)
Creatinine, Ser: 0.63 mg/dL (ref 0.44–1.00)
GFR, Estimated: >60 mL/min (ref >60)
Glucose, Bld: 103 mg/dL — ABNORMAL HIGH (ref 70–99)
Potassium: 4.3 mmol/L (ref 3.5–5.1)
Sodium: 141 mmol/L (ref 135–145)
Total Bilirubin: 1.2 mg/dL (ref 0.3–1.2)
Total Protein: 7.3 g/dL (ref 6.5–8.1)

## 2021-03-19 LAB — LIPASE, BLOOD: Lipase: 30 U/L (ref 11–51)

## 2021-03-19 MED ORDER — IOHEXOL 300 MG/ML  SOLN
100.0000 mL | Freq: Once | INTRAMUSCULAR | Status: AC | PRN
  Administered 2021-03-19: 100 mL via INTRAVENOUS

## 2021-03-19 NOTE — ED Triage Notes (Signed)
Bright red blood "with mucous" in stool x 3 days.

## 2021-03-19 NOTE — Discharge Instructions (Addendum)
Follow up with your GI, call to schedule an appointment. Return to the ER for new or worsening symptoms.

## 2021-03-19 NOTE — ED Triage Notes (Signed)
Emergency Medicine Provider Triage Evaluation Note  Christine Shah , a 55 y.o. female  was evaluated in triage.  Pt complains of mucus and bloody bowel movements.  Patient reports last normal bowel movement was on Thursday.  Since then she is passed nothing but mucus streaked with bright red blood.  Endorses generalized abdominal pressure, nausea and fatigue.  Review of Systems  Positive: Bloody stool, pain with defecation, nausea, Negative: Fever, chills, diarrhea, melena, abdominal distention  Physical Exam  BP (!) 142/87   Pulse 72   Temp 98.3 F (36.8 C) (Oral)   Resp 18   SpO2 99%  Gen:   Awake, no distress   HEENT:  Atraumatic  Resp:  Normal effort  Cardiac:  Normal rate  Abd:   Nondistended, nontender MSK:   Moves extremities without difficulty  Neuro:  Speech clear   Medical Decision Making  Medically screening exam initiated at 1:13 PM.  Appropriate orders placed.  Christine Shah was informed that the remainder of the evaluation will be completed by another provider, this initial triage assessment does not replace that evaluation, and the importance of remaining in the ED until their evaluation is complete.  Clinical Impression   The patient appears stable so that the remainder of the work up may be completed by another provider.     Christine Shah, [place] 03/19/21 1315
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
## 2021-03-20 LAB — GASTROINTESTINAL PANEL BY PCR, STOOL (REPLACES STOOL CULTURE)

## 2021-07-12 ENCOUNTER — Emergency Department (HOSPITAL_COMMUNITY)
Admission: EM | Admit: 2021-07-12 | Discharge: 2021-07-13 | Disposition: A | Discharge status: Home / Self Care | Payer: Medicaid Other | Attending: Emergency Medicine | Admitting: Emergency Medicine

## 2021-07-12 ENCOUNTER — Other Ambulatory Visit: Payer: Self-pay

## 2021-07-12 DIAGNOSIS — J449 Chronic obstructive pulmonary disease, unspecified: Secondary | ICD-10-CM | POA: Diagnosis not present

## 2021-07-12 DIAGNOSIS — R0789 Other chest pain: Secondary | ICD-10-CM | POA: Insufficient documentation

## 2021-07-12 DIAGNOSIS — F17210 Nicotine dependence, cigarettes, uncomplicated: Secondary | ICD-10-CM | POA: Diagnosis not present

## 2021-07-12 DIAGNOSIS — R002 Palpitations: Secondary | ICD-10-CM | POA: Insufficient documentation

## 2021-07-12 DIAGNOSIS — Z8541 Personal history of malignant neoplasm of cervix uteri: Secondary | ICD-10-CM | POA: Diagnosis not present

## 2021-07-12 NOTE — ED Triage Notes (Signed)
Pt c/o "a couple weeks" of palpitations, "heart randomly racing," endorses associated SHOB "when it skips a beat." Concerned it might be more serious than just related to anxiety. States tonight, CP feels "stabbing," in center of chest & R chest.

## 2021-07-13 ENCOUNTER — Emergency Department (HOSPITAL_COMMUNITY): Payer: Medicaid Other

## 2021-07-13 LAB — CBC
HCT: 44.1 % (ref 36.0–46.0)
Hemoglobin: 14.6 g/dL (ref 12.0–15.0)
MCH: 31.5 pg (ref 26.0–34.0)
MCHC: 33.1 g/dL (ref 30.0–36.0)
MCV: 95.2 fL (ref 80.0–100.0)
Platelets: 185 10*3/uL (ref 150–400)
RBC: 4.63 MIL/uL (ref 3.87–5.11)
RDW: 12.9 % (ref 11.5–15.5)
WBC: 7.1 10*3/uL (ref 4.0–10.5)
nRBC: 0 % (ref 0.0–0.2)

## 2021-07-13 LAB — BASIC METABOLIC PANEL
Anion gap: 6 (ref 5–15)
BUN: 10 mg/dL (ref 6–20)
CO2: 29 mmol/L (ref 22–32)
Calcium: 8.9 mg/dL (ref 8.9–10.3)
Chloride: 107 mmol/L (ref 98–111)
Creatinine, Ser: 0.92 mg/dL (ref 0.44–1.00)
GFR, Estimated: >60 mL/min (ref >60)
Glucose, Bld: 115 mg/dL — ABNORMAL HIGH (ref 70–99)
Potassium: 4.1 mmol/L (ref 3.5–5.1)
Sodium: 142 mmol/L (ref 135–145)

## 2021-07-13 LAB — TROPONIN I (HIGH SENSITIVITY)
Troponin I (High Sensitivity): 4 ng/L (ref <18)
Troponin I (High Sensitivity): 3 ng/L (ref <18)
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
## 2021-07-13 NOTE — ED Provider Notes (Signed)
Emergency Medicine Provider Triage Evaluation Note  Christine Shah , a 55 y.o. female  was evaluated in triage.  Pt complains of chest pain.  The patient endorses intermittent episodes of palpitations, heart racing, and shortness of breath over the last few weeks.  She reports constant aching pain in her right chest and shoulder since approximately 19:00.  No palpitations or heart racing at this time.  She has a history of atrial tachycardia and anxiety, but reports that her symptoms feel different from previous exacerbations of anxiety.  No fever, chills, leg swelling, vomiting.  Review of Systems  Positive: Chest pain, palpitations, heart racing, shortness of breath Negative: Vomiting, leg swelling, fever, chills  Physical Exam  BP (!) 144/85 (BP Location: Left Arm)   Pulse 93   Temp 98.3 F (36.8 C) (Oral)   Resp 20   SpO2 98%  Gen:   Awake, no distress   Resp:  Normal effort  MSK:   Moves extremities without difficulty  Other:  Is regular rate and rhythm without murmurs rubs or gallops.  Medical Decision Making  Medically screening exam initiated at 12:03 AM.  Appropriate orders placed.  MIRNA DEFREITAS was informed that the remainder of the evaluation will be completed by another provider, this initial triage assessment does not replace that evaluation, and the importance of remaining in the ED until their evaluation is complete.  The patient's work-up has been initiated in the ER.  She will require further work-up and evaluation.   Joline Maxcy A, PA-C 07/13/21 0005    Orpah Greek, MD 07/13/21 [phone]

## 2022-01-15 ENCOUNTER — Encounter: Payer: Self-pay | Admitting: Internal Medicine

## 2022-02-08 ENCOUNTER — Encounter: Payer: Self-pay | Admitting: Internal Medicine

## 2023-02-05 ENCOUNTER — Encounter (HOSPITAL_COMMUNITY): Payer: Self-pay

## 2023-02-05 ENCOUNTER — Emergency Department (HOSPITAL_COMMUNITY)
Admission: EM | Admit: 2023-02-05 | Discharge: 2023-02-05 | Disposition: A | Discharge status: Home / Self Care | Payer: Medicaid Other | Attending: Emergency Medicine | Admitting: Emergency Medicine

## 2023-02-05 ENCOUNTER — Other Ambulatory Visit: Payer: Self-pay

## 2023-02-05 ENCOUNTER — Emergency Department (HOSPITAL_COMMUNITY): Payer: Medicaid Other

## 2023-02-05 DIAGNOSIS — R0602 Shortness of breath: Secondary | ICD-10-CM | POA: Insufficient documentation

## 2023-02-05 DIAGNOSIS — Z8541 Personal history of malignant neoplasm of cervix uteri: Secondary | ICD-10-CM | POA: Diagnosis not present

## 2023-02-05 DIAGNOSIS — R002 Palpitations: Secondary | ICD-10-CM | POA: Diagnosis not present

## 2023-02-05 DIAGNOSIS — R682 Dry mouth, unspecified: Secondary | ICD-10-CM | POA: Insufficient documentation

## 2023-02-05 LAB — CBC
HCT: 43 % (ref 36.0–46.0)
Hemoglobin: 14.8 g/dL (ref 12.0–15.0)
MCH: 32 pg (ref 26.0–34.0)
MCHC: 34.4 g/dL (ref 30.0–36.0)
MCV: 93.1 fL (ref 80.0–100.0)
Platelets: 172 10*3/uL (ref 150–400)
RBC: 4.62 MIL/uL (ref 3.87–5.11)
RDW: 12.6 % (ref 11.5–15.5)
WBC: 5 10*3/uL (ref 4.0–10.5)
nRBC: 0 % (ref 0.0–0.2)

## 2023-02-05 LAB — TSH: TSH: 1.335 u[IU]/mL (ref 0.350–4.500)

## 2023-02-05 LAB — BASIC METABOLIC PANEL
Anion gap: 8 (ref 5–15)
BUN: 8 mg/dL (ref 6–20)
CO2: 23 mmol/L (ref 22–32)
Calcium: 8.8 mg/dL — ABNORMAL LOW (ref 8.9–10.3)
Chloride: 110 mmol/L (ref 98–111)
Creatinine, Ser: 0.7 mg/dL (ref 0.44–1.00)
GFR, Estimated: >60 mL/min (ref >60)
Glucose, Bld: 101 mg/dL — ABNORMAL HIGH (ref 70–99)
Potassium: 3.9 mmol/L (ref 3.5–5.1)
Sodium: 141 mmol/L (ref 135–145)

## 2023-02-05 LAB — TROPONIN I (HIGH SENSITIVITY)
Troponin I (High Sensitivity): <2 ng/L (ref <18)
Troponin I (High Sensitivity): 2 ng/L (ref <18)

## 2023-02-05 LAB — D-DIMER, QUANTITATIVE: D-Dimer, Quant: <0.27 ug/mL-FEU (ref 0.00–0.50)

## 2023-02-05 LAB — MAGNESIUM: Magnesium: 2 mg/dL (ref 1.7–2.4)

## 2023-02-05 MED ORDER — TETRACAINE HCL 0.5 % OP SOLN
2.0000 [drp] | Freq: Once | OPHTHALMIC | Status: AC
  Administered 2023-02-05: 2 [drp] via OPHTHALMIC
  Filled 2023-02-05: qty 4

## 2023-02-05 MED ORDER — FLUORESCEIN SODIUM 1 MG OP STRP
1.0000 | ORAL_STRIP | Freq: Once | OPHTHALMIC | Status: AC
  Administered 2023-02-05: 1 via OPHTHALMIC
  Filled 2023-02-05: qty 1

## 2023-02-05 NOTE — ED Notes (Signed)
This NT did a vision test on pt, pt did good with letters she was able to read them except for lines 10/12.5 she stated she needed her glasses to be able to read it.

## 2023-02-05 NOTE — ED Triage Notes (Signed)
Pt came from home d/t palpitations that started last week. Pt has hx of anxiety. Pt stated her hearts is racing so hard that takes her breath. Pt noticed her eyes are turning red after the episode she had this morning along w severe headache. A&O X4,

## 2023-02-05 NOTE — ED Provider Notes (Signed)
[facility] Provider Note   CSN: [id number] Arrival date & time: 02/05/23  [id number]     History  Chief Complaint  Patient presents with   Palpitations    Christine Shah is a 57 y.o. female.  Pt is a 57 yo female with pmhx significant for gerd, anxiety, atrial tachycardia, cervical cancer, copd, and ovarian cysts.  Pt went to [place] in 2019 and wore an event monitor.  It showed frequent PACs and nonsustained atrial tachycardia.  Pt was prescribed a beta blocker for the atrial tachycardia, but said it dropped her bp too much and made her feel bad. Pt said she has been under a lot of stress.  The palpitations have been waking her up from sleep.  She said her eyes turned red this morning after episode.         Home Medications Prior to Admission medications   Medication Sig Start Date End Date Taking? Authorizing Provider  ALPRAZolam (XANAX) 0.25 MG tablet Take 0.125 mg by mouth at bedtime as needed for anxiety.    [provider]  dicyclomine (BENTYL) 20 MG tablet Take 1 tablet (20 mg total) by mouth 2 (two) times daily as needed for spasms. Patient not taking: No sig reported 10/04/19   Caccavale, Sophia, PA-C  famotidine (PEPCID) 20 MG tablet Take 1 tablet (20 mg total) by mouth daily. Patient not taking: No sig reported 10/04/19   Caccavale, Sophia, PA-C  lidocaine (LIDODERM) 5 % Place 1 patch onto the skin daily. Remove & Discard patch within 12 hours or as directed by MD Patient not taking: No sig reported 10/17/19   Willia Craze, NP  PROAIR HFA 108 (90 Base) MCG/ACT inhaler TAKE 2 PUFFS BY MOUTH EVERY 6 HOURS AS NEEDED FOR WHEEZE OR SHORTNESS OF BREATH Patient taking differently: Inhale 2 puffs into the lungs every 6 (six) hours as needed for wheezing or shortness of breath. 04/30/20   Lauraine Rinne, NP  saccharomyces boulardii (FLORASTOR) 250 MG capsule Take 250 mg by mouth 2 (two) times daily as needed (stomach).    [provider]      Allergies    Ciprofloxacin, Codeine, Tramadol, and Hydrocodone    Review of Systems   Review of Systems  Cardiovascular:  Positive for palpitations.  All other systems reviewed and are negative.   Physical Exam Updated Vital Signs BP 104/74   Pulse 60   Temp 98.1 F (36.7 C) (Oral)   Resp 13   SpO2 98%  Physical Exam Vitals and nursing note reviewed.  Constitutional:      Appearance: Normal appearance.  HENT:     Head: Normocephalic and atraumatic.     Right Ear: External ear normal.     Left Ear: External ear normal.     Nose: Nose normal.     Mouth/Throat:     Mouth: Mucous membranes are dry.  Eyes:     General: Lids are normal.     Extraocular Movements: Extraocular movements intact.     Conjunctiva/sclera: Conjunctivae normal.     Pupils: Pupils are equal, round, and reactive to light.     Right eye: No corneal abrasion or fluorescein uptake.     Left eye: No corneal abrasion or fluorescein uptake.  Cardiovascular:     Rate and Rhythm: Normal rate and regular rhythm.     Pulses: Normal pulses.     Heart sounds: Normal heart sounds.  Pulmonary:  Effort: Pulmonary effort is normal.     Breath sounds: Normal breath sounds.  Abdominal:     General: Abdomen is flat. Bowel sounds are normal.     Palpations: Abdomen is soft.  Musculoskeletal:        General: Normal range of motion.     Cervical back: Normal range of motion and neck supple.  Skin:    General: Skin is warm.     Capillary Refill: Capillary refill takes less than 2 seconds.  Neurological:     General: No focal deficit present.     Mental Status: She is alert and oriented to person, place, and time.  Psychiatric:        Mood and Affect: Mood normal.        Behavior: Behavior normal.     ED Results / Procedures / Treatments   Labs (all labs ordered are listed, but only abnormal results are displayed) Labs Reviewed  BASIC METABOLIC PANEL - Abnormal; Notable for the  following components:      Result Value   Glucose, Bld 101 (*)    Calcium 8.8 (*)    All other components within normal limits  CBC  D-DIMER, QUANTITATIVE  TSH  MAGNESIUM  TROPONIN I (HIGH SENSITIVITY)  TROPONIN I (HIGH SENSITIVITY)    EKG EKG Interpretation  Date/Time:  Monday February 05 2023 08:47:14 EST Ventricular Rate:  98 PR Interval:  200 QRS Duration: 88 QT Interval:  370 QTC Calculation: 473 R Axis:   -9 Text Interpretation: Sinus rhythm Borderline prolonged PR interval Low voltage, precordial leads No significant change since last tracing Confirmed by Isla Pence [phone]) on 02/05/2023 10:30:46 AM  Radiology DG Chest Port 1 View  Result Date: 02/05/2023 CLINICAL DATA:  Shortness of breath. EXAM: PORTABLE CHEST 1 VIEW COMPARISON:  July 13, 2021. FINDINGS: The heart size and mediastinal contours are within normal limits. No acute pulmonary abnormality is noted. The visualized skeletal structures are unremarkable. IMPRESSION: No active disease. Electronically Signed   By: Marijo Conception M.D.   On: 02/05/2023 09:39    Procedures Procedures    Medications Ordered in ED Medications  fluorescein ophthalmic strip 1 strip (1 strip Both Eyes Given 02/05/23 1056)  tetracaine (PONTOCAINE) 0.5 % ophthalmic solution 2 drop (2 drops Both Eyes Given 02/05/23 1056)    ED Course/ Medical Decision Making/ A&P                             Medical Decision Making Amount and/or Complexity of Data Reviewed Labs: ordered. Radiology: ordered.  Risk Prescription drug management.   This patient presents to the ED for concern of palpitations, this involves an extensive number of treatment options, and is a complaint that carries with it a high risk of complications and morbidity.  The differential diagnosis includes dvt, pvcs, pacs, atrial tachycardia, st   Co morbidities that complicate the patient evaluation  gerd, anxiety, atrial tachycardia, cervical cancer, copd, and ovarian  cysts   Additional history obtained:  Additional history obtained from epic chart review   Lab Tests:  I Ordered, and personally interpreted labs.  The pertinent results include:  cbc nl, bmp nl, mg nl, trop nl, ddimer neg, tsh nl   Imaging Studies ordered:  I ordered imaging studies including cxr  I independently visualized and interpreted imaging which showed No active disease.  I agree with the radiologist interpretation   Cardiac Monitoring:  The patient was  maintained on a cardiac monitor.  I personally viewed and interpreted the cardiac monitored which showed an underlying rhythm of: nsr   Medicines ordered and prescription drug management:   I have reviewed the patients home medicines and have made adjustments as needed     Problem List / ED Course:  Palpitations:  pt is likely having PACs and atrial tachycardia.  She is encouraged to avoid tobacco and caffeine.  She is working at a very stressful job and is encouraged to try to decrease stress.  She is to return if worse.  F/u with cards.   Reevaluation:  After the interventions noted above, I reevaluated the patient and found that they have :improved   Social Determinants of Health:  Lives at home   Dispostion:  After consideration of the diagnostic results and the patients response to treatment, I feel that the patent would benefit from discharge with outpatient f/u.          Final Clinical Impression(s) / ED Diagnoses Final diagnoses:  Heart palpitations    Rx / DC Orders ED Discharge Orders     None         Isla Pence, MD 02/05/23 1257

## 2023-02-05 NOTE — Discharge Instructions (Addendum)
Decrease tobacco and caffeine intake.  Try to decrease stress levels.

## 2023-03-14 ENCOUNTER — Ambulatory Visit: Payer: Medicaid Other | Admitting: Internal Medicine

## 2023-09-29 ENCOUNTER — Other Ambulatory Visit: Payer: Self-pay

## 2023-09-29 ENCOUNTER — Emergency Department (HOSPITAL_COMMUNITY)
Admission: EM | Admit: 2023-09-29 | Discharge: 2023-09-29 | Disposition: A | Discharge status: Home / Self Care | Payer: Self-pay | Attending: Emergency Medicine | Admitting: Emergency Medicine

## 2023-09-29 ENCOUNTER — Emergency Department (HOSPITAL_COMMUNITY): Payer: Self-pay

## 2023-09-29 ENCOUNTER — Encounter (HOSPITAL_COMMUNITY): Payer: Self-pay

## 2023-09-29 DIAGNOSIS — M79604 Pain in right leg: Secondary | ICD-10-CM

## 2023-09-29 DIAGNOSIS — Z8541 Personal history of malignant neoplasm of cervix uteri: Secondary | ICD-10-CM | POA: Insufficient documentation

## 2023-09-29 DIAGNOSIS — Z7951 Long term (current) use of inhaled steroids: Secondary | ICD-10-CM | POA: Insufficient documentation

## 2023-09-29 DIAGNOSIS — J449 Chronic obstructive pulmonary disease, unspecified: Secondary | ICD-10-CM | POA: Insufficient documentation

## 2023-09-29 DIAGNOSIS — M5417 Radiculopathy, lumbosacral region: Secondary | ICD-10-CM | POA: Insufficient documentation

## 2023-09-29 LAB — CBC
HCT: 42.9 % (ref 36.0–46.0)
Hemoglobin: 14.3 g/dL (ref 12.0–15.0)
MCH: 32.5 pg (ref 26.0–34.0)
MCHC: 33.3 g/dL (ref 30.0–36.0)
MCV: 97.5 fL (ref 80.0–100.0)
Platelets: 163 10*3/uL (ref 150–400)
RBC: 4.4 MIL/uL (ref 3.87–5.11)
RDW: 12.9 % (ref 11.5–15.5)
WBC: 4.7 10*3/uL (ref 4.0–10.5)
nRBC: 0 % (ref 0.0–0.2)

## 2023-09-29 LAB — BASIC METABOLIC PANEL
Anion gap: 5 (ref 5–15)
BUN: 9 mg/dL (ref 6–20)
CO2: 27 mmol/L (ref 22–32)
Calcium: 8.6 mg/dL — ABNORMAL LOW (ref 8.9–10.3)
Chloride: 108 mmol/L (ref 98–111)
Creatinine, Ser: 0.64 mg/dL (ref 0.44–1.00)
GFR, Estimated: >60 mL/min (ref >60)
Glucose, Bld: 103 mg/dL — ABNORMAL HIGH (ref 70–99)
Potassium: 3.5 mmol/L (ref 3.5–5.1)
Sodium: 140 mmol/L (ref 135–145)

## 2023-09-29 LAB — MAGNESIUM: Magnesium: 2 mg/dL (ref 1.7–2.4)

## 2023-09-29 MED ORDER — IBUPROFEN 800 MG PO TABS
800.0000 mg | ORAL_TABLET | Freq: Once | ORAL | Status: AC
  Administered 2023-09-29: 800 mg via ORAL
  Filled 2023-09-29: qty 1

## 2023-09-29 NOTE — Discharge Instructions (Addendum)
As discussed, you have stenotic changes of your lumbar spine which can lead to radiculopathy, or radiating pain. Please try stretching at home and take antiinflammatory medications, like Ibuprofen and other NSAIDs to help with the pain.  Follow-up with your PCP in 5 days for reevaluation.  If you do not have one, there is a phone number in the discharge paperwork that you can call to help you get established with one.  Get help right away if: You develop severe pain. Your pain suddenly gets worse. You develop increasing weakness in your legs. You lose the ability to control your bladder or bowel. You have difficulty walking or balancing. You have a fever.

## 2023-09-29 NOTE — ED Triage Notes (Addendum)
Patient reports right leg pain x 2 days. States it feels like it is burning. Patient drank electrolytes thinking she was dehydrated without relief.  Extremity is warm to the touch and pulse is palpable.

## 2023-09-29 NOTE — ED Provider Notes (Signed)
[place] EMERGENCY DEPARTMENT AT [facility] Provider Note   CSN: [id number] Arrival date & time: 09/29/23  1002     History  Chief Complaint  Patient presents with   Leg Pain    Christine Shah is a 57 y.o. female with a history of cervical cancer, COPD, GERD, sciatica presents the ED today for leg pain.  Patient states that she has been having pain and burning that radiates from her right hip down the leg for the past 3 days. She reports an episode of low back pain prior to the onset of the leg pain, which has since subsided but the leg pain continues. She reports history of sciatica but admits that this pain is worse. 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 No weakness, numbness, swelling, or erythema to the legs. She is able to ambulate. No other complaints or concerns at this time.    Home Medications Prior to Admission medications   Medication Sig Start Date End Date Taking? Authorizing Provider  ALPRAZolam (XANAX) 0.25 MG tablet Take 0.125 mg by mouth at bedtime as needed for anxiety.    [provider]  dicyclomine (BENTYL) 20 MG tablet Take 1 tablet (20 mg total) by mouth 2 (two) times daily as needed for spasms. Patient not taking: No sig reported 10/04/19   Caccavale, Sophia, PA-C  famotidine (PEPCID) 20 MG tablet Take 1 tablet (20 mg total) by mouth daily. Patient not taking: No sig reported 10/04/19   Caccavale, Sophia, PA-C  lidocaine (LIDODERM) 5 % Place 1 patch onto the skin daily. Remove & Discard patch within 12 hours or as directed by MD Patient not taking: No sig reported 10/17/19   Meredith Pel, NP  PROAIR HFA 108 (90 Base) MCG/ACT inhaler TAKE 2 PUFFS BY MOUTH EVERY 6 HOURS AS NEEDED FOR WHEEZE OR SHORTNESS OF BREATH Patient taking differently: Inhale 2 puffs into the lungs every 6 (six) hours as needed for wheezing or shortness of breath. 04/30/20   Coral Ceo, NP   saccharomyces boulardii (FLORASTOR) 250 MG capsule Take 250 mg by mouth 2 (two) times daily as needed (stomach).    [provider]      Allergies    Ciprofloxacin, Codeine, Tramadol, and Hydrocodone    Review of Systems   Review of Systems  Musculoskeletal:        Right leg pain  All other systems reviewed and are negative.   Physical Exam Updated Vital Signs BP (!) 157/75 (BP Location: Left Arm)   Pulse 92   Temp 98 F (36.7 C) (Oral)   Resp 16   Ht 5\' 7"  (1.702 m)   Wt 93 kg   SpO2 97%   BMI 32.11 kg/m  Physical Exam Vitals and nursing note reviewed.  Constitutional:      General: She is not in acute distress.    Appearance: Normal appearance.  HENT:     Head: Normocephalic and atraumatic.     Mouth/Throat:     Mouth: Mucous membranes are moist.  Eyes:     Conjunctiva/sclera: Conjunctivae normal.     Pupils: Pupils are equal, round, and reactive to light.  Cardiovascular:     Rate and Rhythm: Normal rate and regular rhythm.     Pulses: Normal pulses.     Heart sounds: Normal heart sounds.  Pulmonary:     Effort: Pulmonary effort is normal.  Breath sounds: Normal breath sounds.  Musculoskeletal:        General: Tenderness present. No swelling or deformity. Normal range of motion.     Right lower leg: No edema.     Left lower leg: No edema.     Comments: Tenderness to palpation of right posterior hip. No midline vertebral tenderness to palpation. No swelling or tenderness to the popliteal fossa. Sensation and strength remain intact. Palpable posterior tibialis pulses bilaterally.  Skin:    General: Skin is warm and dry.     Findings: No rash.  Neurological:     General: No focal deficit present.     Mental Status: She is alert.     Sensory: No sensory deficit.     Motor: No weakness.     Gait: Gait normal.  Psychiatric:        Mood and Affect: Mood normal.        Behavior: Behavior normal.    ED Results / Procedures / Treatments    Labs (all labs ordered are listed, but only abnormal results are displayed) Labs Reviewed  BASIC METABOLIC PANEL - Abnormal; Notable for the following components:      Result Value   Glucose, Bld 103 (*)    Calcium 8.6 (*)    All other components within normal limits  MAGNESIUM  CBC    EKG None  Radiology DG Lumbar Spine Complete  Result Date: 09/29/2023 CLINICAL DATA:  Low back pain EXAM: LUMBAR SPINE - COMPLETE 4+ VIEW COMPARISON:  01/07/2015 FINDINGS: Normal alignment of the lumbar spine. The vertebral body heights are well preserved. No sign of acute fracture or dislocation. Facet arthropathy identified at L4-5 and L5-S1. IMPRESSION: 1. No acute findings. 2. Lower lumbar facet arthropathy. Electronically Signed   By: Signa Kell M.D.   On: 09/29/2023 12:05    Procedures Procedures: not indicated.   Medications Ordered in ED Medications  ibuprofen (ADVIL) tablet 800 mg (800 mg Oral Given 09/29/23 1048)    ED Course/ Medical Decision Making/ A&P                                 Medical Decision Making Amount and/or Complexity of Data Reviewed Labs: ordered. Radiology: ordered.  Risk Prescription drug management.   This patient presents to the ED for concern of right leg pain, this involves an extensive number of treatment options, and is a complaint that carries with it a high risk of complications and morbidity.   Differential diagnosis includes: fracture, dislocation, contusion, DVT, sciatic nerve pain, neuropathy, etc.   Comorbidities  See HPI above   Additional History  Additional history obtained from previous records.   Lab Tests  I ordered and personally interpreted labs.  The pertinent results include:   BMP, magnesium, and CBC are within normal limits - no electrolyte derangement, AKI, infection, or anemia   Imaging Studies  I ordered imaging studies including lumbar spine x-ray  I independently visualized and interpreted imaging which  showed: no acute findings. Facet arthropathy at L4-5 and L5-S1. I agree with the radiologist interpretation   Problem List / ED Course / Critical Interventions / Medication Management  Right leg pain x3 days I ordered medications including: Ibuprofen for pain  Reevaluation of the patient after these medicines showed that the patient improved I have reviewed the patients home medicines and have made adjustments as needed   Social Determinants of Health  Occupation   Test / Admission - Considered  Discussed findings with patient. She is hemodynamically stable and safe for discharge home.  Return precautions provided.        Final Clinical Impression(s) / ED Diagnoses Final diagnoses:  Right leg pain  Radiculopathy of lumbosacral region    Rx / DC Orders ED Discharge Orders     None         Maxwell Marion, PA-C 09/29/23 1225    Wynetta Fines, MD 09/29/23 [phone]

## 2024-08-05 ENCOUNTER — Ambulatory Visit (HOSPITAL_COMMUNITY)
Admission: EM | Admit: 2024-08-05 | Discharge: 2024-08-05 | Disposition: A | Discharge status: Home / Self Care | Payer: Self-pay | Attending: Family Medicine | Admitting: Family Medicine

## 2024-08-05 ENCOUNTER — Encounter (HOSPITAL_COMMUNITY): Payer: Self-pay

## 2024-08-05 ENCOUNTER — Ambulatory Visit (INDEPENDENT_AMBULATORY_CARE_PROVIDER_SITE_OTHER): Payer: Self-pay

## 2024-08-05 DIAGNOSIS — M7989 Other specified soft tissue disorders: Secondary | ICD-10-CM

## 2024-08-05 MED ORDER — DEXAMETHASONE SODIUM PHOSPHATE 10 MG/ML IJ SOLN
INTRAMUSCULAR | Status: AC
  Filled 2024-08-05: qty 1

## 2024-08-05 MED ORDER — DEXAMETHASONE SODIUM PHOSPHATE 10 MG/ML IJ SOLN
10.0000 mg | Freq: Once | INTRAMUSCULAR | Status: AC
  Administered 2024-08-05: 10 mg via INTRAMUSCULAR

## 2024-08-05 NOTE — Discharge Instructions (Addendum)
 Meds ordered this encounter  Medications   dexamethasone (DECADRON) injection 10 mg

## 2024-08-05 NOTE — ED Notes (Signed)
 Patient did not want to wear the Cam Boot out of the UC because she did not feel safe driving with the Cam boot. Patient shown how to apple the CAm boot and voiced understanding.
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
## 2024-08-05 NOTE — ED Triage Notes (Signed)
 Patient reports that she began having redness and swelling to the right foot since 07/14/24. Patient went to her PCP and was ordered a 10 day supply of Doxycycline  100 mg for cellulitis. Patient states the swelling has decreased but the redness remains. Patient states when she raises her right toes she has pain.

## 2024-08-06 ENCOUNTER — Ambulatory Visit (HOSPITAL_COMMUNITY): Payer: Self-pay

## 2024-08-13 ENCOUNTER — Ambulatory Visit: Payer: Self-pay | Admitting: Podiatry
